# Patient Record
Sex: Female | Born: 1980 | Race: White | Hispanic: No | Marital: Single | Smoking: Former smoker
Health system: Southern US, Community
[De-identification: ages and names within clinical notes are randomized; demographics above are authoritative.]

## PROBLEM LIST (undated history)

## (undated) DIAGNOSIS — I10 Essential (primary) hypertension: Secondary | ICD-10-CM

## (undated) DIAGNOSIS — Z803 Family history of malignant neoplasm of breast: Secondary | ICD-10-CM

## (undated) DIAGNOSIS — F32A Depression, unspecified: Secondary | ICD-10-CM

## (undated) DIAGNOSIS — F909 Attention-deficit hyperactivity disorder, unspecified type: Secondary | ICD-10-CM

## (undated) DIAGNOSIS — F319 Bipolar disorder, unspecified: Secondary | ICD-10-CM

## (undated) DIAGNOSIS — Z808 Family history of malignant neoplasm of other organs or systems: Secondary | ICD-10-CM

## (undated) DIAGNOSIS — G43909 Migraine, unspecified, not intractable, without status migrainosus: Secondary | ICD-10-CM

## (undated) DIAGNOSIS — N19 Unspecified kidney failure: Secondary | ICD-10-CM

## (undated) DIAGNOSIS — F419 Anxiety disorder, unspecified: Secondary | ICD-10-CM

## (undated) DIAGNOSIS — G629 Polyneuropathy, unspecified: Secondary | ICD-10-CM

## (undated) DIAGNOSIS — F4310 Post-traumatic stress disorder, unspecified: Secondary | ICD-10-CM

## (undated) DIAGNOSIS — G8929 Other chronic pain: Secondary | ICD-10-CM

## (undated) HISTORY — DX: Unspecified kidney failure: N19

## (undated) HISTORY — DX: Family history of malignant neoplasm of other organs or systems: Z80.8

## (undated) HISTORY — DX: Family history of malignant neoplasm of breast: Z80.3

## (undated) HISTORY — PX: DILATION AND CURETTAGE OF UTERUS: SHX78

## (undated) HISTORY — DX: Polyneuropathy, unspecified: G62.9

## (undated) HISTORY — DX: Post-traumatic stress disorder, unspecified: F43.10

## (undated) HISTORY — DX: Migraine, unspecified, not intractable, without status migrainosus: G43.909

---

## 2019-09-17 IMAGING — CR DG THORACIC SPINE 2V
2 series · 2 of 2 positions shown · non-contrast
Comparison: None.

CLINICAL DATA: Upper back pain

EXAM:
THORACIC SPINE 2 VIEWS

[t t-spine a.p.]
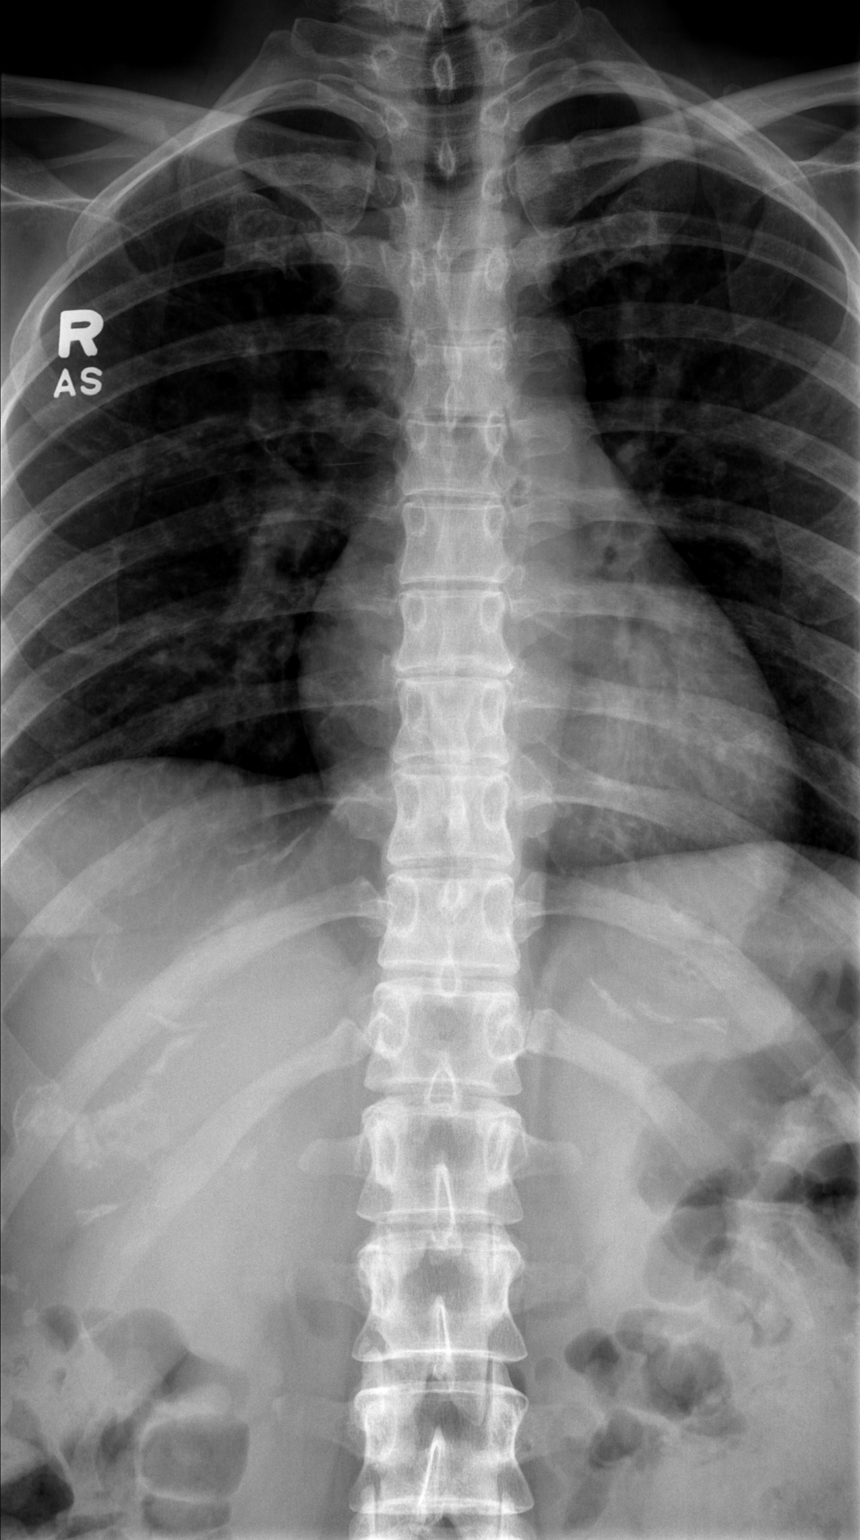

[t t-spine lat *]
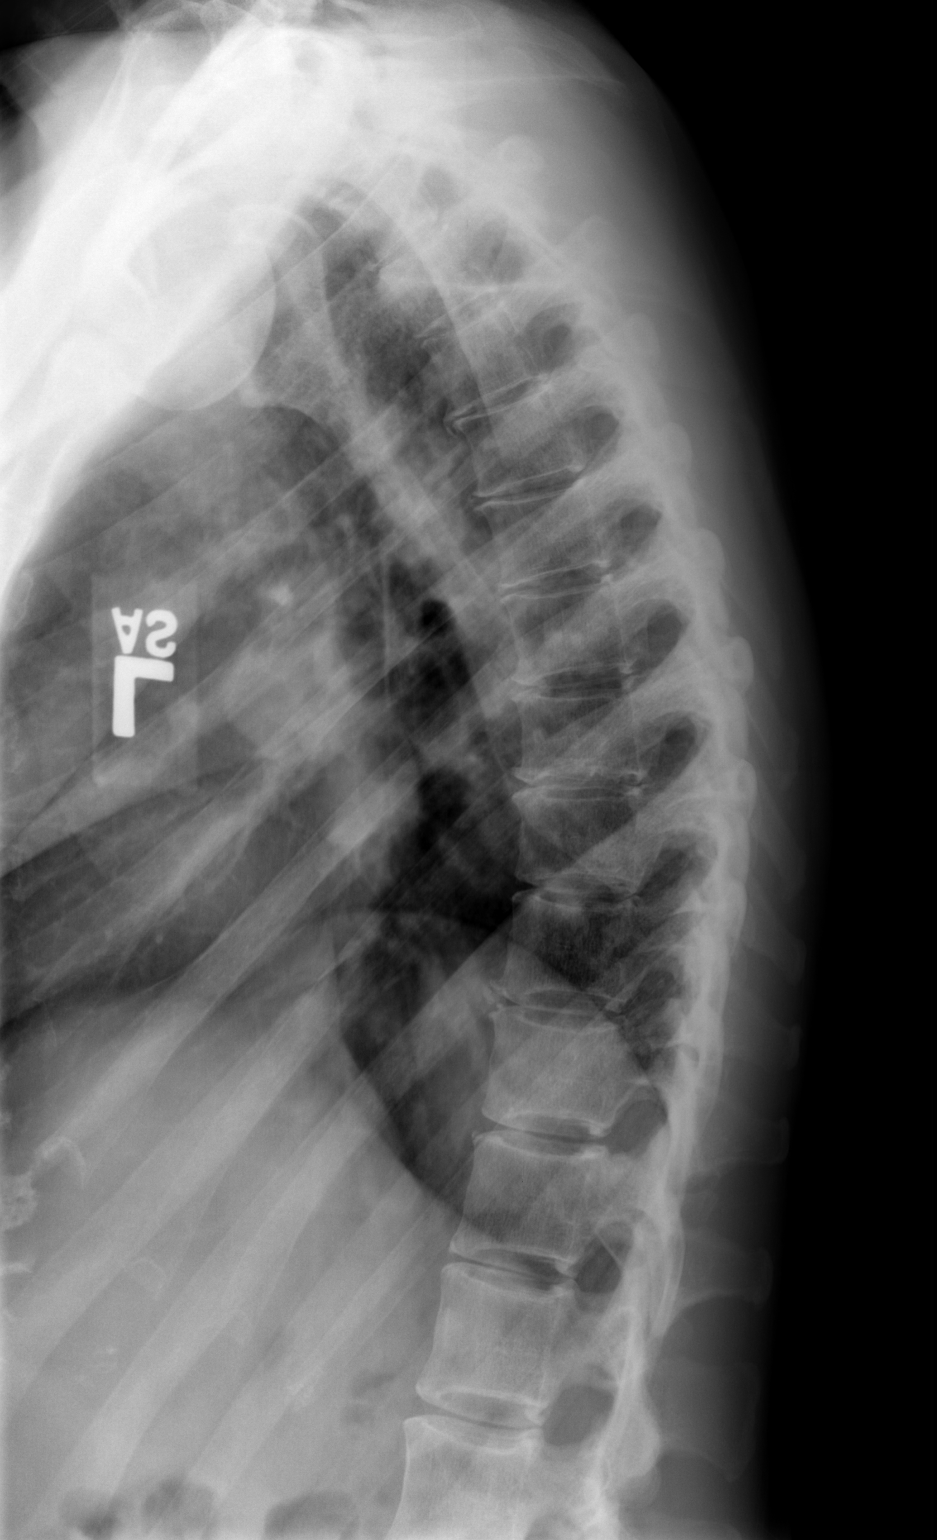

[2 of 2 positions shown; findings below may reference images not displayed]

FINDINGS: There is no evidence of thoracic spine fracture. Alignment is
normal. No other significant bone abnormalities are identified.
IMPRESSION: Negative.

## 2020-02-22 ENCOUNTER — Ambulatory Visit (HOSPITAL_COMMUNITY)
Admission: EM | Admit: 2020-02-22 | Discharge: 2020-02-22 | Disposition: A | Discharge status: Home / Self Care | Payer: Medicaid Other

## 2020-02-22 NOTE — Care Management (Signed)
Patient wants to receive services with [facility] an outpatient basis.  Patient was linked to [facility] outpatient walk in clinic.    Patient denies SI/HI/Psychosis/Substance Abuse.  Patient denies MSE Exam.

## 2020-04-12 ENCOUNTER — Other Ambulatory Visit: Payer: Self-pay | Admitting: *Deleted

## 2020-04-12 DIAGNOSIS — Z1231 Encounter for screening mammogram for malignant neoplasm of breast: Secondary | ICD-10-CM

## 2020-04-22 ENCOUNTER — Other Ambulatory Visit: Payer: Self-pay | Admitting: Family

## 2020-04-22 DIAGNOSIS — N6320 Unspecified lump in the left breast, unspecified quadrant: Secondary | ICD-10-CM

## 2020-05-26 ENCOUNTER — Other Ambulatory Visit: Payer: Self-pay

## 2020-05-26 ENCOUNTER — Ambulatory Visit
Admission: RE | Admit: 2020-05-26 | Discharge: 2020-05-26 | Disposition: A | Discharge status: Home / Self Care | Payer: Medicaid Other | Source: Ambulatory Visit | Attending: Family | Admitting: Family

## 2020-05-26 DIAGNOSIS — N6320 Unspecified lump in the left breast, unspecified quadrant: Secondary | ICD-10-CM

## 2020-05-26 IMAGING — US US BREAST*L* LIMITED INC AXILLA
1 series · 12 of 12 positions shown · non-contrast
Comparison: Previous exam(s).

CLINICAL DATA: 39-year-old female presenting for two year follow-up
of probably benign left breast masses.

EXAM:
ULTRASOUND OF THE LEFT BREAST

[Series 1: us breast*left* limited inc axilla · 0.06mm/px · 12 of 12 slices shown]
[im 1/12]
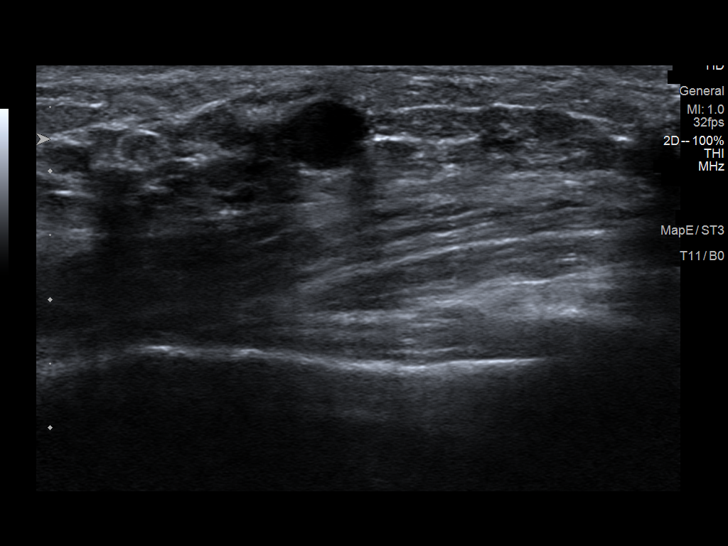
[im 2/12]
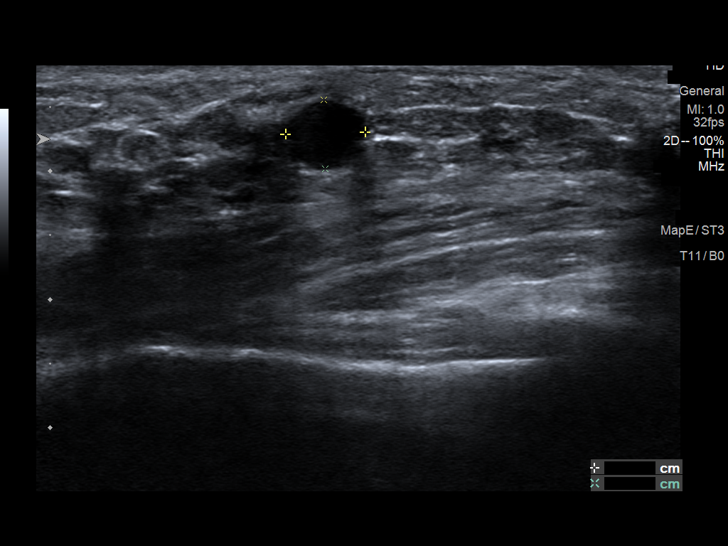
[im 3/12]
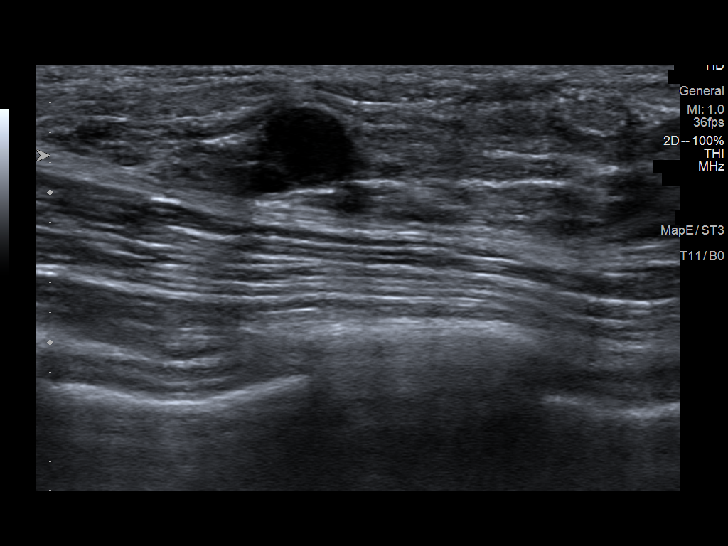
[im 4/12]
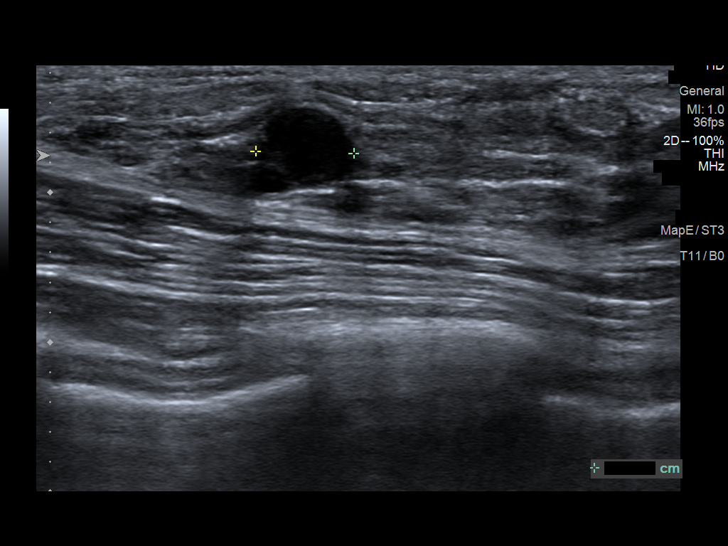
[im 5/12]
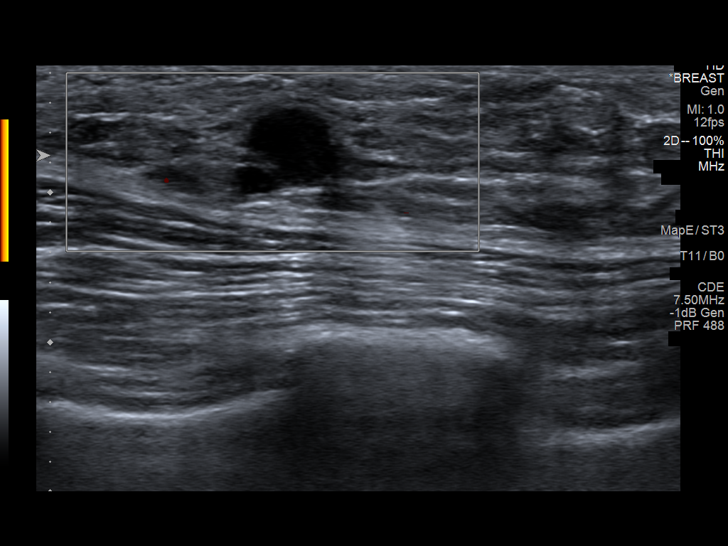
[im 6/12]
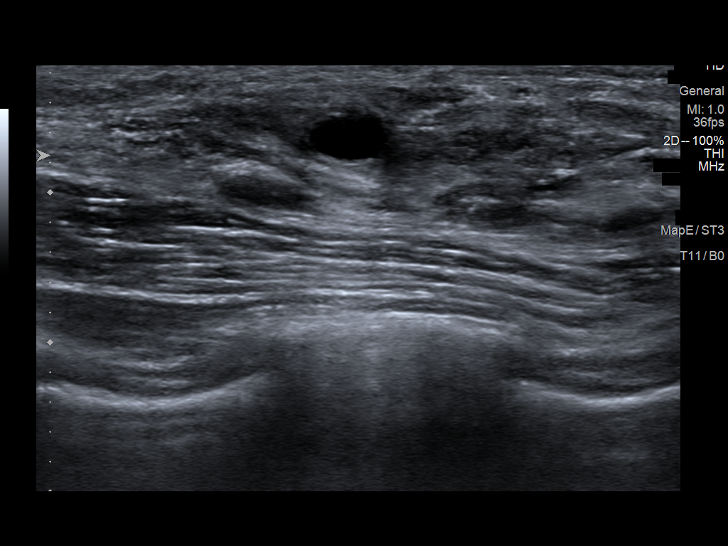
[im 7/12]
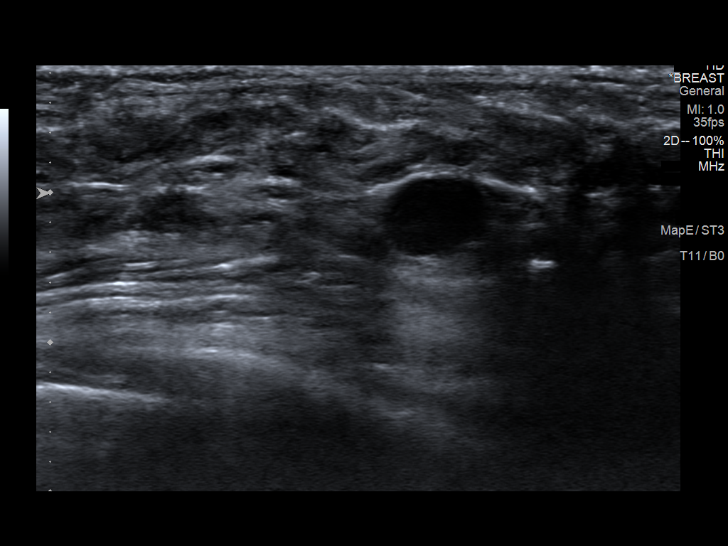
[im 8/12]
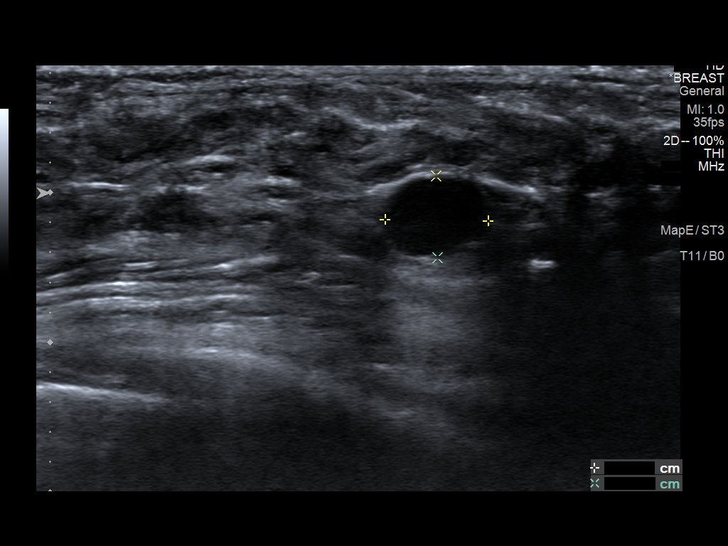
[im 9/12]
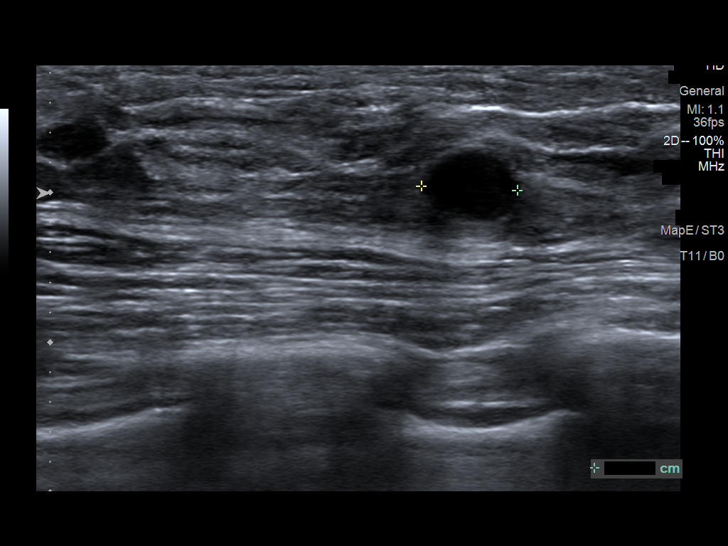
[im 10/12]
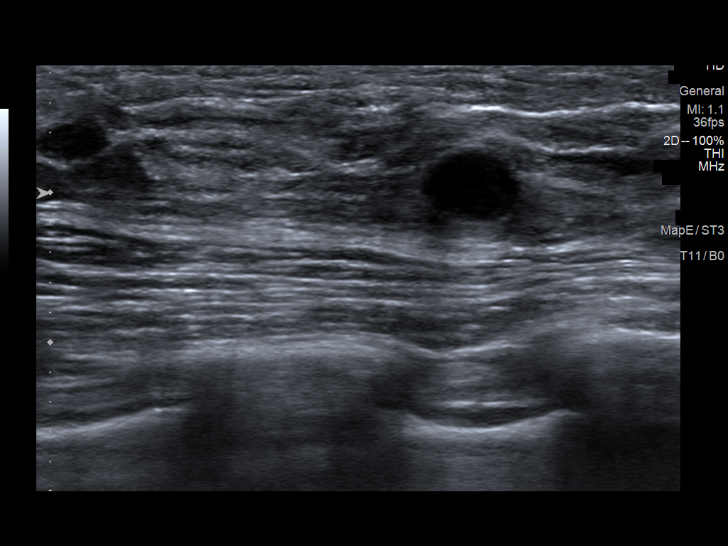
[im 11/12]
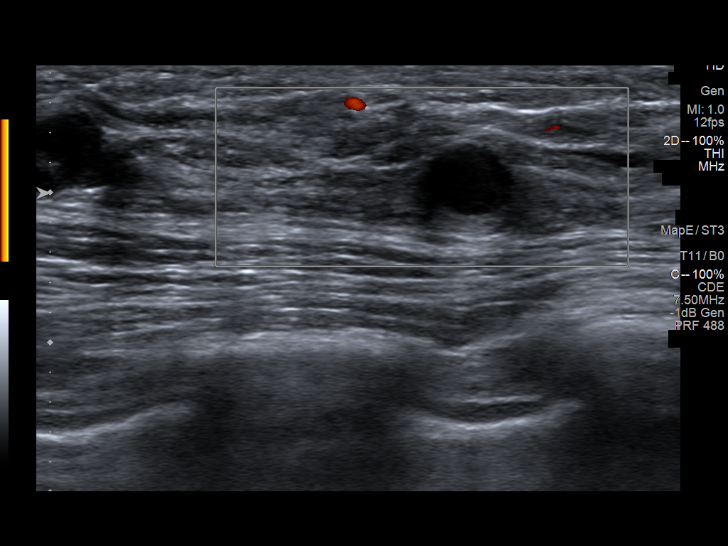
[im 12/12]
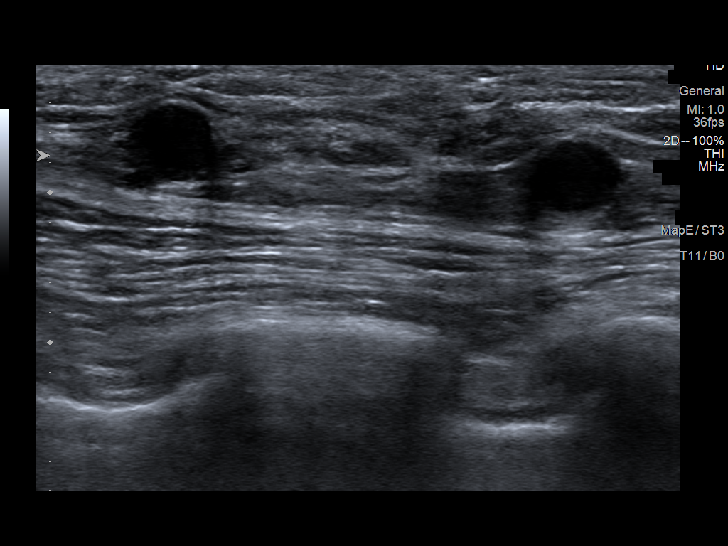

[12 of 12 positions shown; findings below may reference images not displayed]

FINDINGS: Targeted ultrasound is performed in the left breast at 11 o'clock 6
cm from the nipple demonstrating an oval circumscribed hypoechoic
mass measuring 0.6 x 0.5 x 0.7 cm, previously measuring 0.6 x 0.5 x
0.7 cm.

Targeted ultrasound is performed in the left breast at 11 o'clock 4
cm from nipple demonstrating an oval circumscribed hypoechoic mass
measuring 0.7 x 0.6 x 0.6 cm, previously measuring 0.6 x 0.7 cm.
IMPRESSION: Stable masses in the left breast at 11 o'clock, which given
stability for over 2 years are considered benign.

RECOMMENDATION:
Return to routine annual screening mammography which will be due in
[DATE].

I have discussed the findings and recommendations with the patient.
If applicable, a reminder letter will be sent to the patient
regarding the next appointment.

BI-RADS CATEGORY  2: Benign.

## 2020-07-15 ENCOUNTER — Other Ambulatory Visit: Payer: Self-pay | Admitting: Orthopedic Surgery

## 2020-07-15 DIAGNOSIS — M25512 Pain in left shoulder: Secondary | ICD-10-CM

## 2020-08-06 ENCOUNTER — Other Ambulatory Visit: Payer: Self-pay

## 2020-08-06 ENCOUNTER — Ambulatory Visit
Admission: RE | Admit: 2020-08-06 | Discharge: 2020-08-06 | Disposition: A | Discharge status: Home / Self Care | Payer: Medicaid Other | Source: Ambulatory Visit | Attending: Orthopedic Surgery | Admitting: Orthopedic Surgery

## 2020-08-06 DIAGNOSIS — M25512 Pain in left shoulder: Secondary | ICD-10-CM

## 2020-08-06 IMAGING — MR MR SHOULDER*L* W/O CM
4 of 5 series · 27 of 40 positions shown · non-contrast
Comparison: None.

CLINICAL DATA: Left shoulder pain for 3 years. Worsening pain with
lifting. No acute injury or prior relevant surgery.

EXAM:
MRI OF THE LEFT SHOULDER WITHOUT CONTRAST
TECHNIQUE: Multiplanar, multisequence MR imaging of the shoulder was performed.
No intravenous contrast was administered.

[Series 6: T2 fat-sat · axial · left · 3.0mm · 0.62mm/px · z∈[-58,+40]mm · 8 of 27 slices shown (1 of 3)]
[im 1/27]
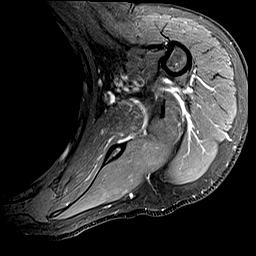
[im 4/27]
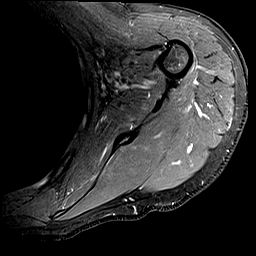
[im 8/27]
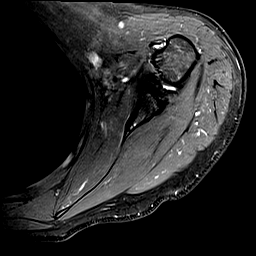
[im 12/27]
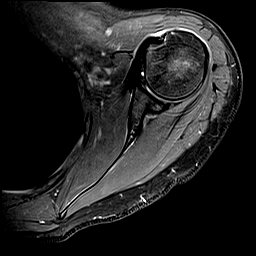
[im 15/27]
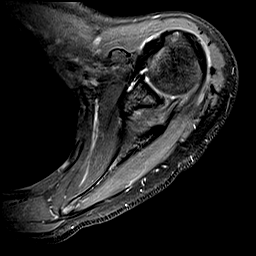
[im 19/27]
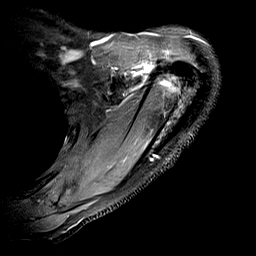
[im 23/27]
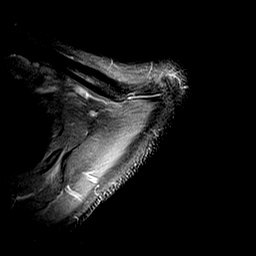
[im 27/27]
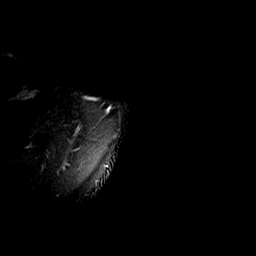

[Series 7: T2 fat-sat · oblique · left · 4.0mm · 0.27mm/px · 8 of 23 slices shown (2 of 3)]
[im 1/23]
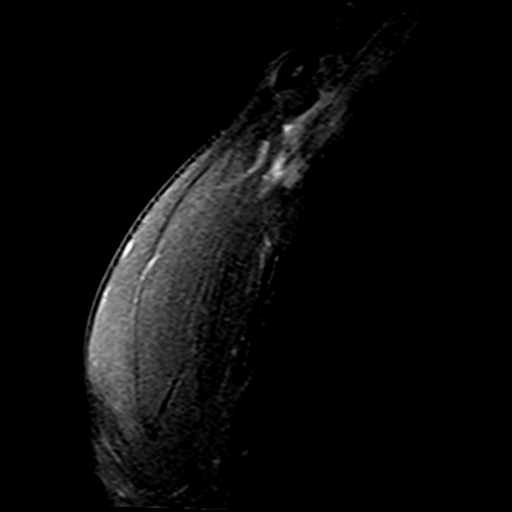
[im 4/23]
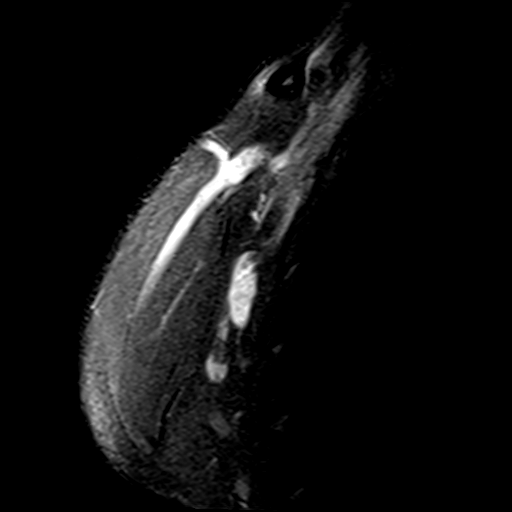
[im 7/23]
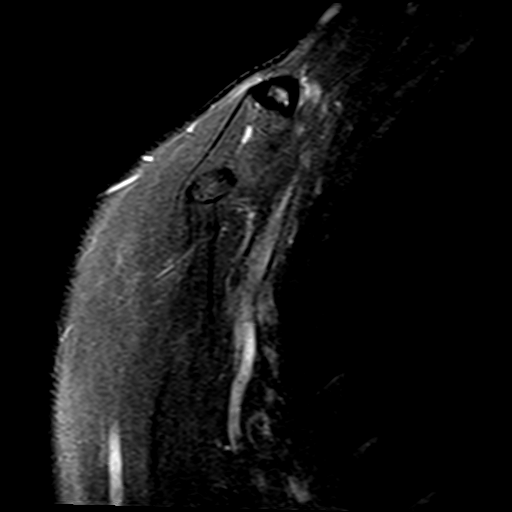
[im 10/23]
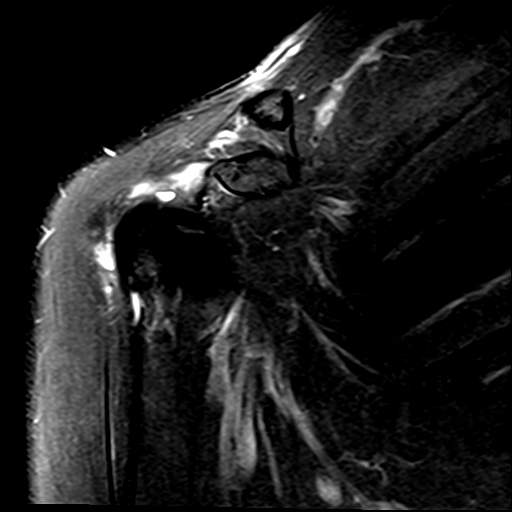
[im 13/23]
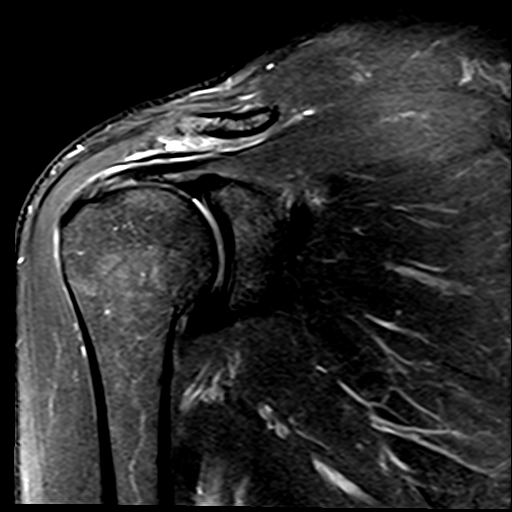
[im 16/23]
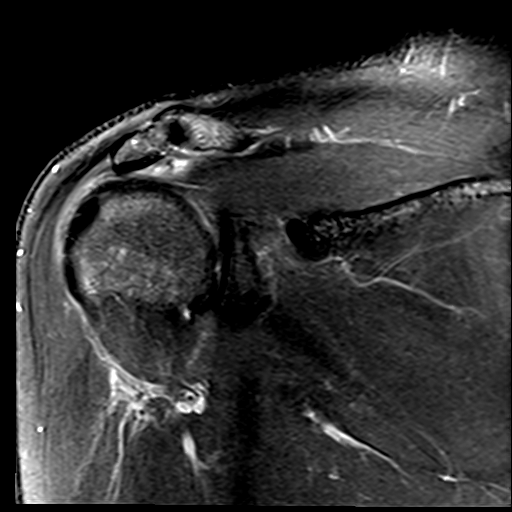
[im 19/23]
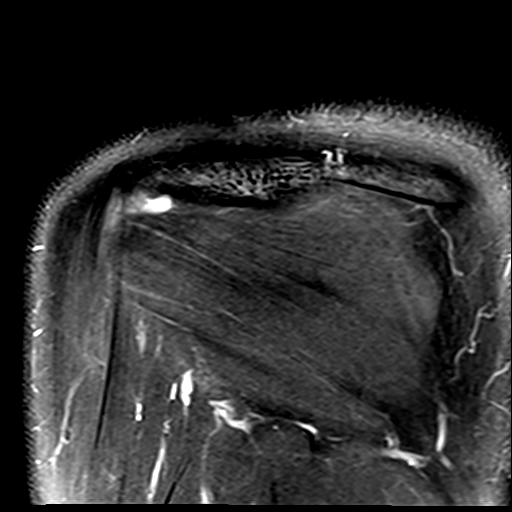
[im 23/23]
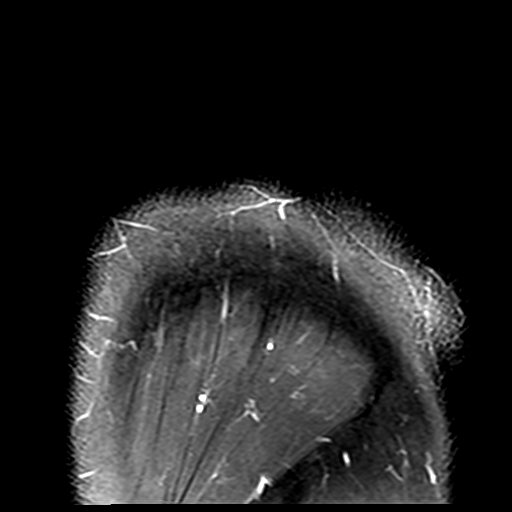

[Series 8: PD · oblique · left · 4.0mm · 0.27mm/px · 8 of 23 slices shown]
[im 1/23]
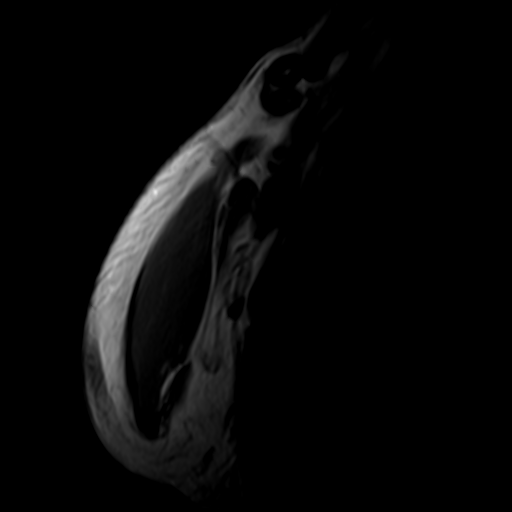
[im 4/23]
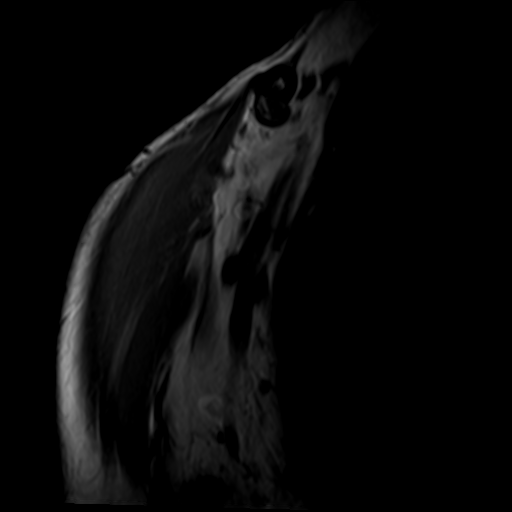
[im 7/23]
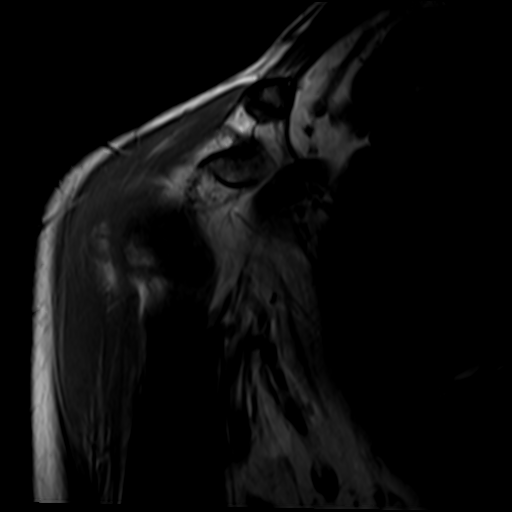
[im 10/23]
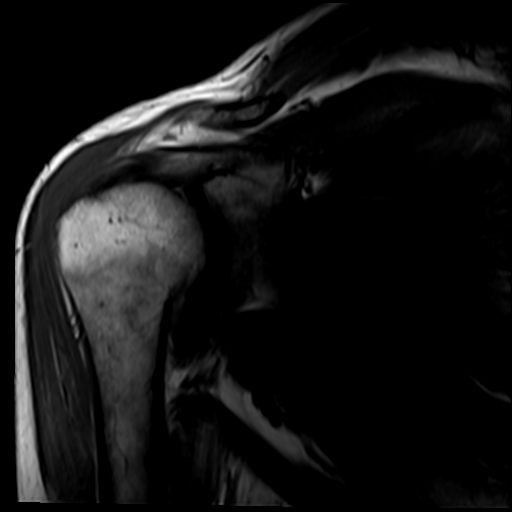
[im 13/23]
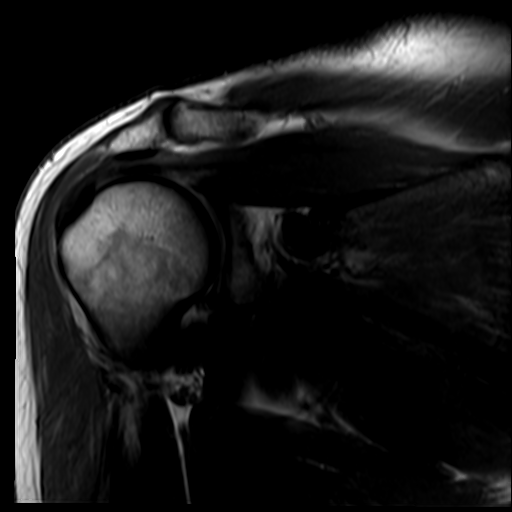
[im 16/23]
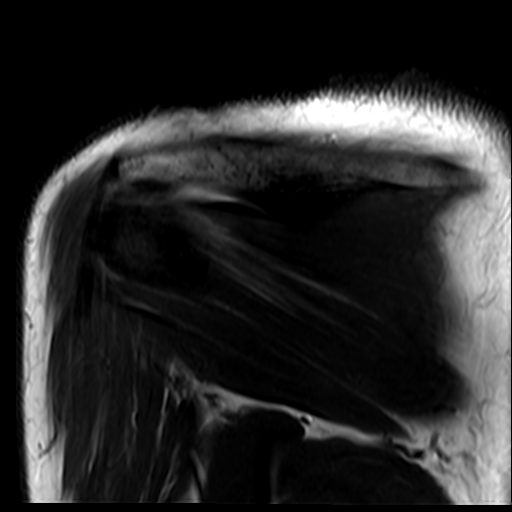
[im 19/23]
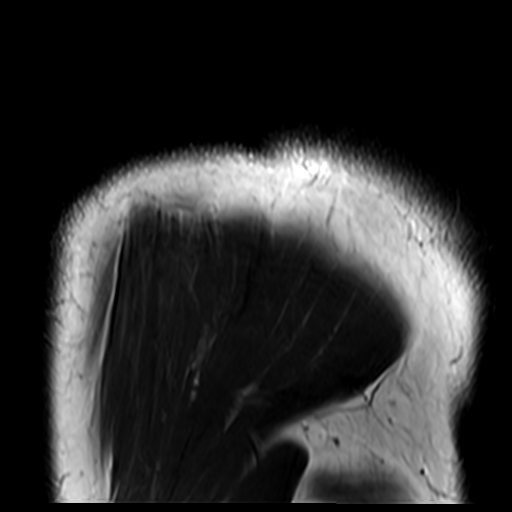
[im 23/23]
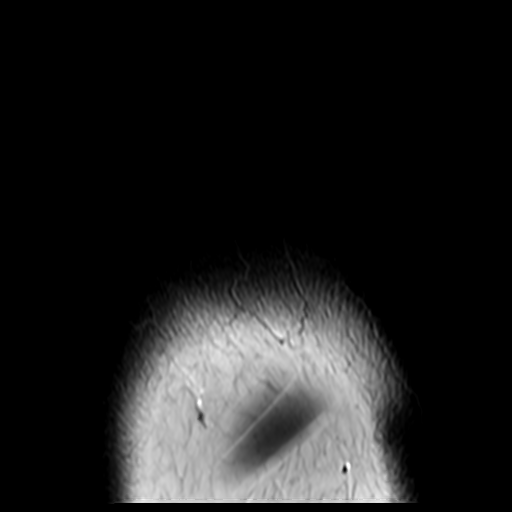

[Series 9: T2 fat-sat · coronal · left · 4.0mm · 0.59mm/px · 3 of 23 slices shown (3 of 3)]
[im 4/23]
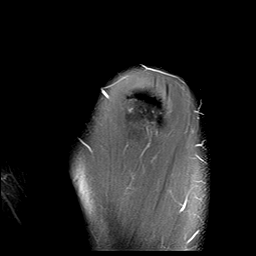
[im 13/23]
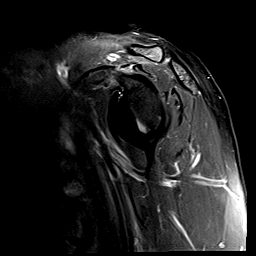
[im 19/23]
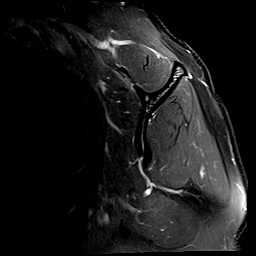

[27 of 40 positions shown; findings below may reference images not displayed]

FINDINGS: Rotator cuff: Mild supraspinatus tendinosis and bursal surface
fraying without tear. The infraspinatus, subscapularis and teres
minor tendons appear normal.

Muscles:  No focal muscular atrophy or edema.

Biceps long head:  Intact and normally positioned.

Acromioclavicular Joint: The acromion is type 1. There are mild
acromioclavicular degenerative changes. There is a moderate amount
of complex fluid anteriorly in the subacromial-subdeltoid bursa,
extending into the rotator interval.

Glenohumeral Joint: No significant shoulder joint effusion or
glenohumeral arthropathy.

Labrum:  No evidence of labral tear or paralabral cyst.

Bones: No acute or significant extra-articular osseous findings.

Other: No significant soft tissue findings.
IMPRESSION: 1. Moderate subacromial-subdeltoid bursitis.
2. Mild supraspinatus tendinosis and bursal surface fraying. No
evidence of rotator cuff tear.
3. The labrum and biceps tendon appear intact.
4. Mild acromioclavicular degenerative changes.

## 2020-08-30 ENCOUNTER — Encounter (HOSPITAL_COMMUNITY): Payer: Self-pay | Admitting: Emergency Medicine

## 2020-08-30 ENCOUNTER — Other Ambulatory Visit: Payer: Self-pay

## 2020-08-30 ENCOUNTER — Ambulatory Visit (HOSPITAL_COMMUNITY)
Admission: EM | Admit: 2020-08-30 | Discharge: 2020-08-30 | Disposition: A | Discharge status: Home / Self Care | Payer: Medicaid Other

## 2020-08-30 DIAGNOSIS — I10 Essential (primary) hypertension: Secondary | ICD-10-CM

## 2020-08-30 DIAGNOSIS — R079 Chest pain, unspecified: Secondary | ICD-10-CM | POA: Diagnosis not present

## 2020-08-30 DIAGNOSIS — R42 Dizziness and giddiness: Secondary | ICD-10-CM | POA: Diagnosis not present

## 2020-08-30 HISTORY — DX: Essential (primary) hypertension: I10

## 2020-08-30 HISTORY — DX: Other chronic pain: G89.29

## 2020-08-30 HISTORY — DX: Depression, unspecified: F32.A

## 2020-08-30 HISTORY — DX: Anxiety disorder, unspecified: F41.9

## 2020-08-30 HISTORY — DX: Attention-deficit hyperactivity disorder, unspecified type: F90.9

## 2020-08-30 NOTE — ED Provider Notes (Signed)
[facility]    CSN: [id number] Arrival date & time: 08/30/20  1803      History   Chief Complaint Chief Complaint  Patient presents with  . Hypertension  . Hand Swelling    HPI Tina Russell is a 40 y.o. female.   Patient presents with high blood pressure and swelling in her hands earlier today.  She also reports dizziness and chest pain earlier today; the symptoms have resolved.  She was recently started on Strattera; last dose taken yesterday.  She also recently started taking Lamictal.  She denies numbness, weakness, shortness of breath, or other symptoms.  Her medical history includes hypertension, depression, anxiety, ADHD, chronic back pain.  The history is provided by the patient.    Past Medical History:  Diagnosis Date  . ADHD   . Anxiety   . Chronic back pain   . Depression   . Hypertension     There are no problems to display for this patient.   History reviewed. No pertinent surgical history.  OB History   No obstetric history on file.      Home Medications    Prior to Admission medications   Medication Sig Start Date End Date Taking? Authorizing Provider  lamoTRIgine (LAMICTAL) 25 MG tablet Take 1 tablet daily for 2 weeks then 2 tablets daily thereafter. 08/09/20  Yes [provider]  QUEtiapine (SEROQUEL) 25 MG tablet Take 1-2 tablets by mouth nightly 07/25/20  Yes [provider]  atomoxetine (STRATTERA) 25 MG capsule Take 25 mg by mouth every morning. 08/23/20   [provider]  levonorgestrel (MIRENA) 20 MCG/24HR IUD by Intrauterine route.    [provider]    Family History Family History  Problem Relation Age of Onset  . Hypertension Mother   . Hyperlipidemia Mother   . Hypertension Father   . Hyperlipidemia Father   . Alcohol abuse Father     Social History Social History   Tobacco Use  . Smoking status: Former Smoker    Types: E-cigarettes  . Smokeless tobacco: Never Used  Vaping Use   . Vaping Use: Every day  Substance Use Topics  . Alcohol use: Yes  . Drug use: Never     Allergies   Patient has no known allergies.   Review of Systems Review of Systems  Constitutional: Negative for chills and fever.  HENT: Negative for ear pain and sore throat.   Eyes: Negative for pain and visual disturbance.  Respiratory: Negative for cough and shortness of breath.   Cardiovascular: Positive for chest pain. Negative for palpitations.  Gastrointestinal: Negative for abdominal pain and vomiting.  Genitourinary: Negative for dysuria and hematuria.  Musculoskeletal: Negative for arthralgias and back pain.       Bilateral hand swelling  Skin: Negative for color change and rash.  Neurological: Positive for dizziness. Negative for syncope, weakness and numbness.  All other systems reviewed and are negative.    Physical Exam Triage Vital Signs ED Triage Vitals  Enc Vitals Group     BP      Pulse      Resp      Temp      Temp src      SpO2      Weight      Height      Head Circumference      Peak Flow      Pain Score      Pain Loc      Pain  Edu?      Excl. in GC?    No data found.  Updated Vital Signs BP (!) 167/103 (BP Location: Left Arm)   Pulse 71   Temp 98.1 F (36.7 C) (Oral)   Resp 18   SpO2 98%   Visual Acuity Right Eye Distance:   Left Eye Distance:   Bilateral Distance:    Right Eye Near:   Left Eye Near:    Bilateral Near:     Physical Exam Vitals and nursing note reviewed.  Constitutional:      General: She is not in acute distress.    Appearance: She is well-developed. She is not ill-appearing.  HENT:     Head: Normocephalic and atraumatic.     Mouth/Throat:     Mouth: Mucous membranes are moist.  Eyes:     Conjunctiva/sclera: Conjunctivae normal.  Cardiovascular:     Rate and Rhythm: Normal rate and regular rhythm.     Heart sounds: Normal heart sounds.  Pulmonary:     Effort: Pulmonary effort is normal. No respiratory  distress.     Breath sounds: Normal breath sounds.  Abdominal:     Palpations: Abdomen is soft.     Tenderness: There is no abdominal tenderness.  Musculoskeletal:        General: Normal range of motion.     Cervical back: Neck supple.     Right lower leg: No edema.     Left lower leg: No edema.     Comments: No hand swelling noted.  Skin:    General: Skin is warm and dry.  Neurological:     General: No focal deficit present.     Mental Status: She is alert and oriented to person, place, and time.     Sensory: No sensory deficit.     Motor: No weakness.     Gait: Gait normal.  Psychiatric:        Mood and Affect: Mood normal.        Behavior: Behavior normal.      UC Treatments / Results  Labs (all labs ordered are listed, but only abnormal results are displayed) Labs Reviewed - No data to display  EKG   Radiology No results found.  Procedures Procedures (including critical care time)  Medications Ordered in UC Medications - No data to display  Initial Impression / Assessment and Plan / UC Course  I have reviewed the triage vital signs and the nursing notes.  Pertinent labs & imaging results that were available during my care of the patient were reviewed by me and considered in my medical decision making (see chart for details).   Elevated blood pressure reading with hypertension.  Chest pain, dizziness.  EKG shows sinus rhythm, rate 78, no previous to compare, no ST elevation.  Patient does not currently have chest pain or dizziness.  Discussed limitations of evaluation in the urgent care for chest pain.  Patient requests transfer to the ED for additional evaluation.  Instructed her to stop taking the Strattera and to follow-up with her PCP tomorrow.  She agrees to plan of care.   Final Clinical Impressions(s) / UC Diagnoses   Final diagnoses:  Elevated blood pressure reading in office with diagnosis of hypertension  Chest pain, unspecified type  Dizziness      Discharge Instructions     Go to the emergency department if you have acute chest pain, dizziness, or other concerning symptoms.  Stop taking the Strattera.  Follow-up with your  primary care provider tomorrow.    ED Prescriptions    None     PDMP not reviewed this encounter.   Mickie Bail, NP 08/30/20 1935

## 2020-08-30 NOTE — Discharge Instructions (Addendum)
Go to the emergency department if you have acute chest pain, dizziness, or other concerning symptoms.  Stop taking the Strattera.  Follow-up with your primary care provider tomorrow.

## 2020-08-30 NOTE — ED Triage Notes (Signed)
Pt presents with high blood pressure and swelling in hands. States was recently prescribed Strattera. States is also having nigh sweats after starting back on lamictal.

## 2020-09-19 ENCOUNTER — Other Ambulatory Visit: Payer: Self-pay | Admitting: General Practice

## 2020-09-19 ENCOUNTER — Other Ambulatory Visit: Payer: Self-pay | Admitting: Family

## 2020-09-19 DIAGNOSIS — Z1231 Encounter for screening mammogram for malignant neoplasm of breast: Secondary | ICD-10-CM

## 2020-09-22 ENCOUNTER — Other Ambulatory Visit: Payer: Self-pay

## 2020-09-22 ENCOUNTER — Ambulatory Visit
Admission: RE | Admit: 2020-09-22 | Discharge: 2020-09-22 | Disposition: A | Discharge status: Home / Self Care | Payer: Medicaid Other | Source: Ambulatory Visit | Attending: Family | Admitting: Family

## 2020-09-22 DIAGNOSIS — Z1231 Encounter for screening mammogram for malignant neoplasm of breast: Secondary | ICD-10-CM

## 2020-09-26 ENCOUNTER — Other Ambulatory Visit: Payer: Self-pay | Admitting: Family

## 2020-09-26 DIAGNOSIS — N644 Mastodynia: Secondary | ICD-10-CM

## 2020-10-09 HISTORY — PX: BREAST BIOPSY: SHX20

## 2020-10-13 ENCOUNTER — Ambulatory Visit
Admission: RE | Admit: 2020-10-13 | Discharge: 2020-10-13 | Disposition: A | Discharge status: Home / Self Care | Payer: Medicaid Other | Source: Ambulatory Visit | Attending: Family Medicine | Admitting: Family Medicine

## 2020-10-13 ENCOUNTER — Other Ambulatory Visit: Payer: Self-pay | Admitting: Family Medicine

## 2020-10-13 ENCOUNTER — Other Ambulatory Visit: Payer: Self-pay

## 2020-10-13 DIAGNOSIS — M549 Dorsalgia, unspecified: Secondary | ICD-10-CM

## 2020-10-13 DIAGNOSIS — R0789 Other chest pain: Secondary | ICD-10-CM

## 2020-10-13 DIAGNOSIS — M542 Cervicalgia: Secondary | ICD-10-CM

## 2020-10-13 DIAGNOSIS — M545 Low back pain, unspecified: Secondary | ICD-10-CM

## 2020-10-13 IMAGING — CR DG CERVICAL SPINE COMPLETE 4+V
6 series · 6 of 6 positions shown · non-contrast
Comparison: None.

CLINICAL DATA: Cervicalgia

EXAM:
CERVICAL SPINE - COMPLETE 4+ VIEW

[w c-spine lat]
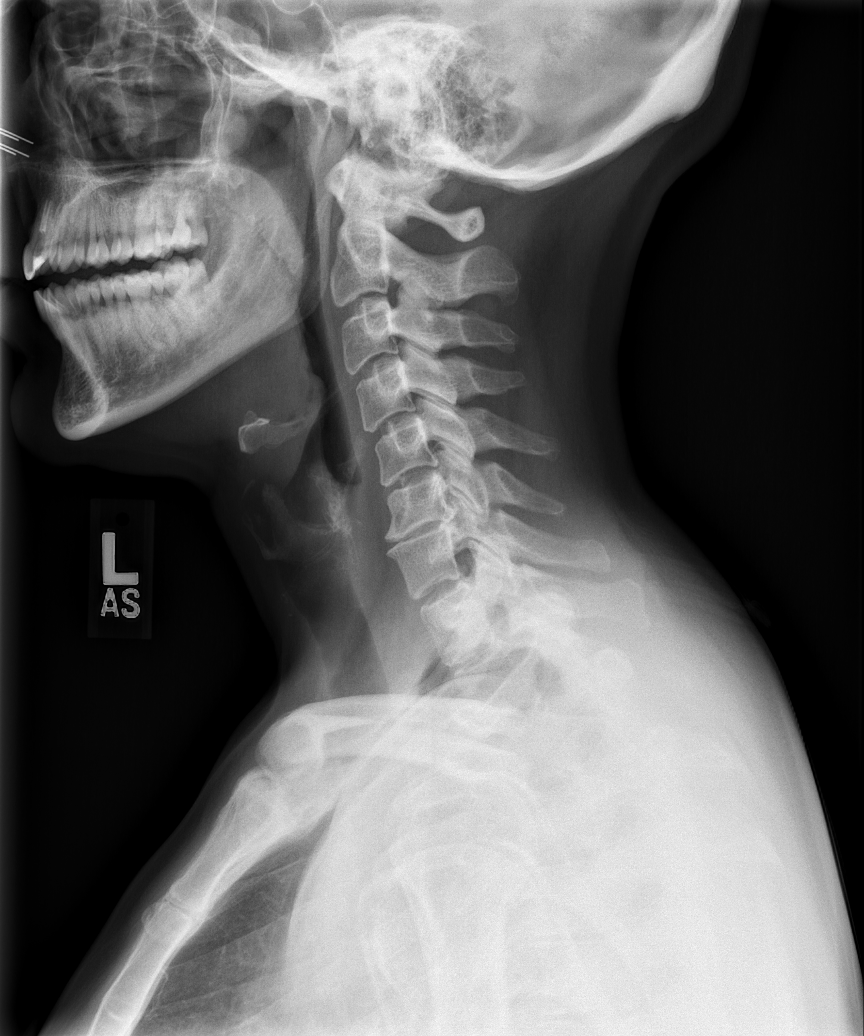

[w c-spine oblique (1 of 2)]
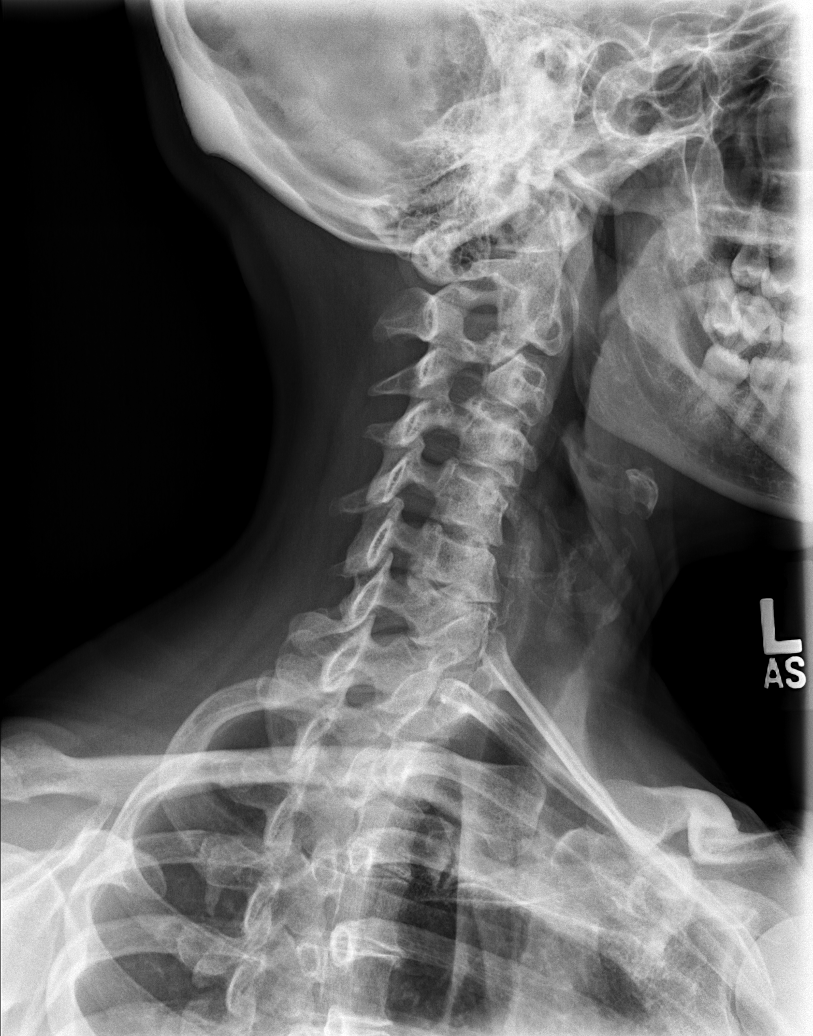

[w c-spine oblique (2 of 2)]
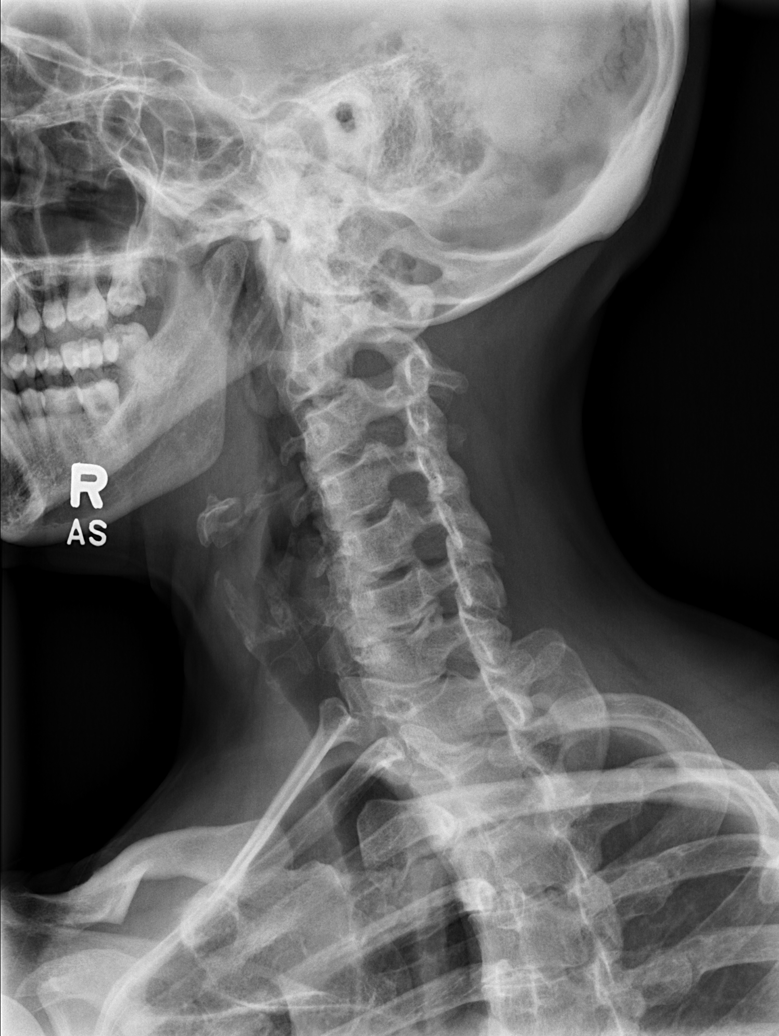

[w c-spine a.p. *]
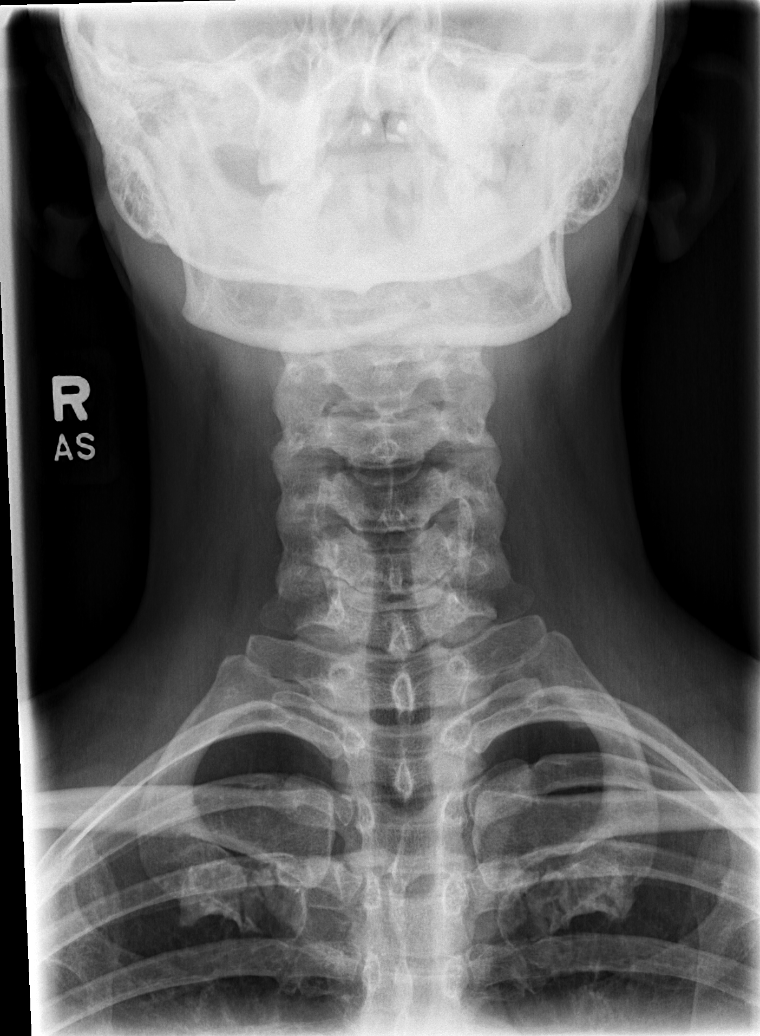

[w c-spine odontoid * (1 of 2)]
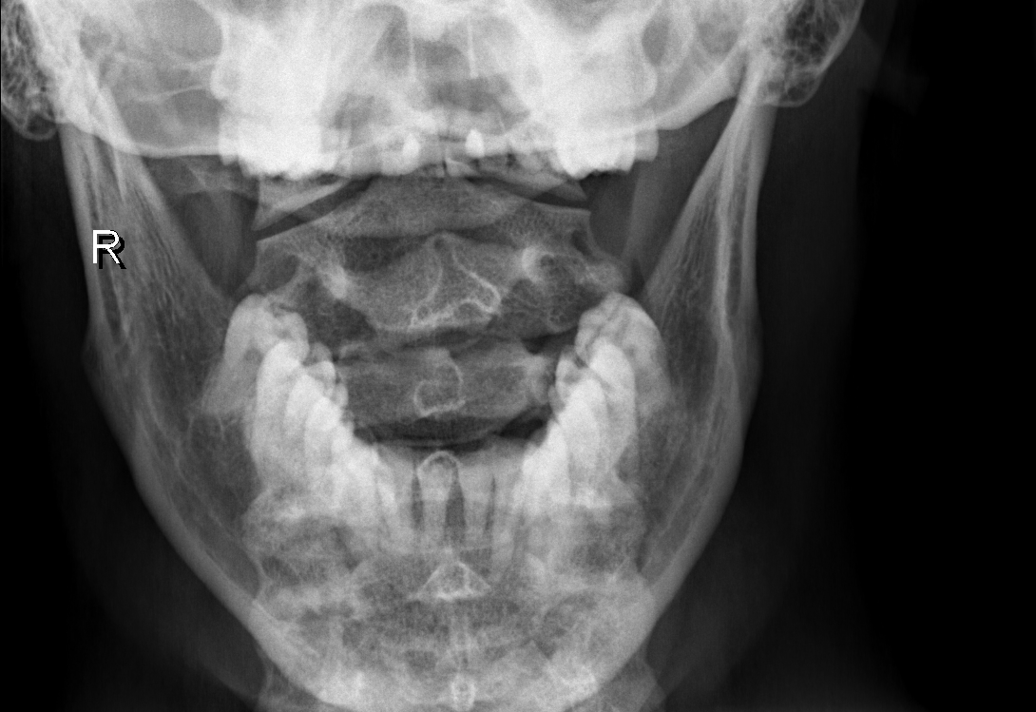

[w c-spine odontoid * (2 of 2)]
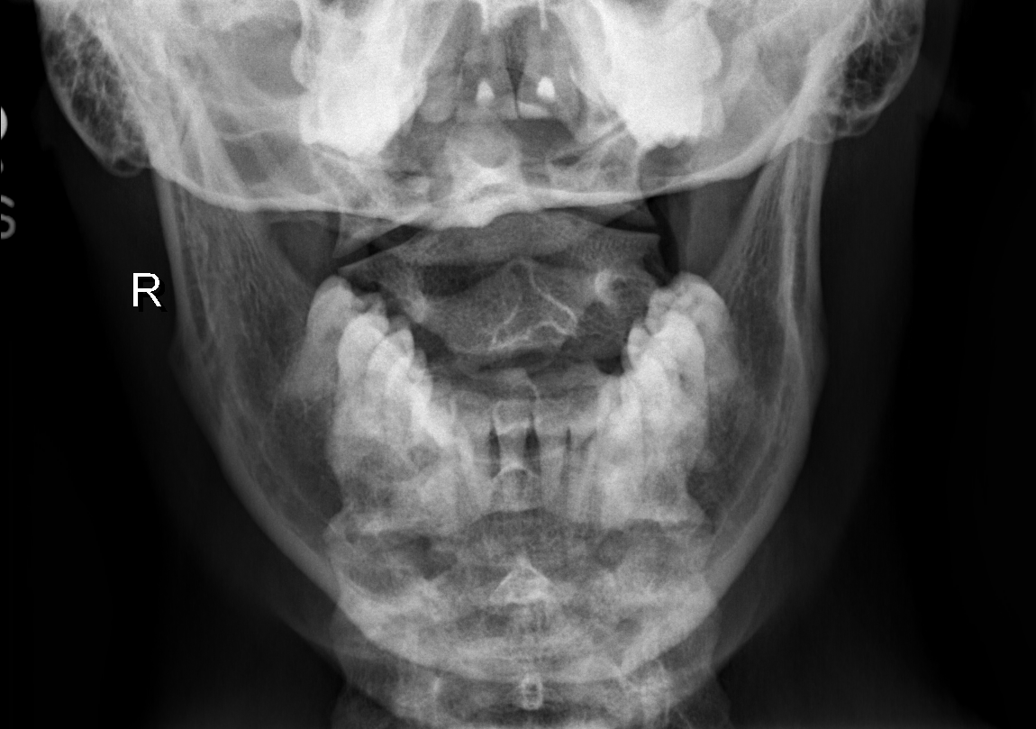

[6 of 6 positions shown; findings below may reference images not displayed]

FINDINGS: Cervical spine is visualized to the level of T1.

Vertebral body heights are maintained. Prevertebral soft tissues are
normal. No acute fracture.

2 mm retrolisthesis of C6 on C7.

Degenerative disease with disc height loss at C6-7 with broad-based
disc osteophyte complex. Bilateral facet arthropathy at C6-7 and
C7-T1. Bilateral uncovertebral degenerative changes at C6-7 with
foraminal encroachment.
IMPRESSION: Cervical spine spondylosis at C6-7 as described above.

## 2020-10-13 IMAGING — CR DG LUMBAR SPINE COMPLETE 4+V
5 series · 5 of 5 positions shown · non-contrast
Comparison: None.

CLINICAL DATA: Low back pain

EXAM:
LUMBAR SPINE - COMPLETE 4+ VIEW

[t l-spine a.p.]
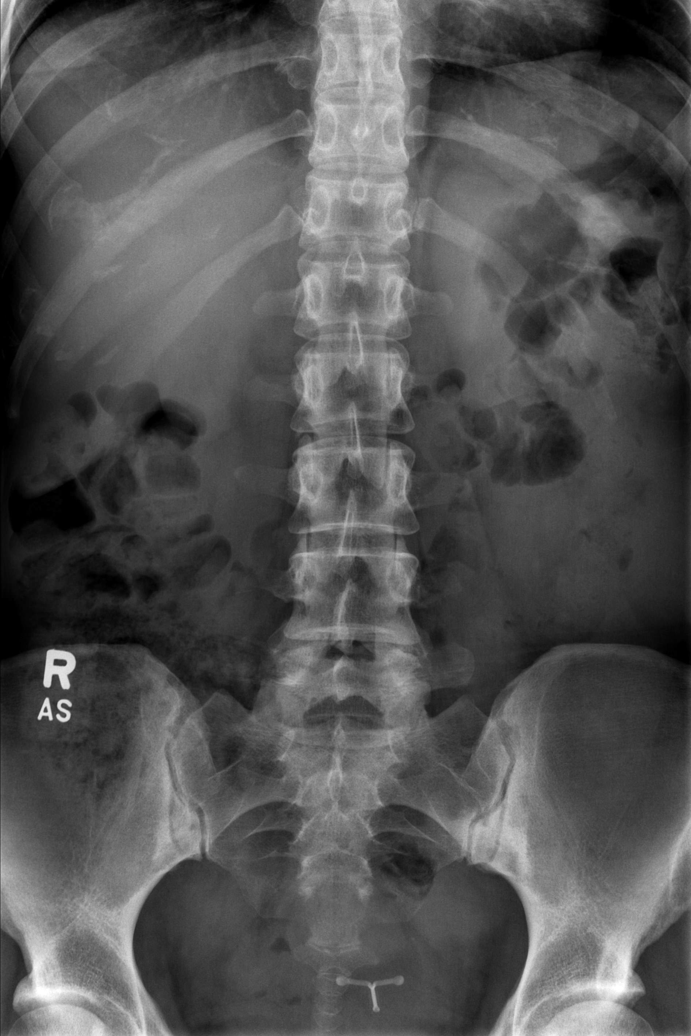

[t l-spine oblique exposure (1 of 2)]
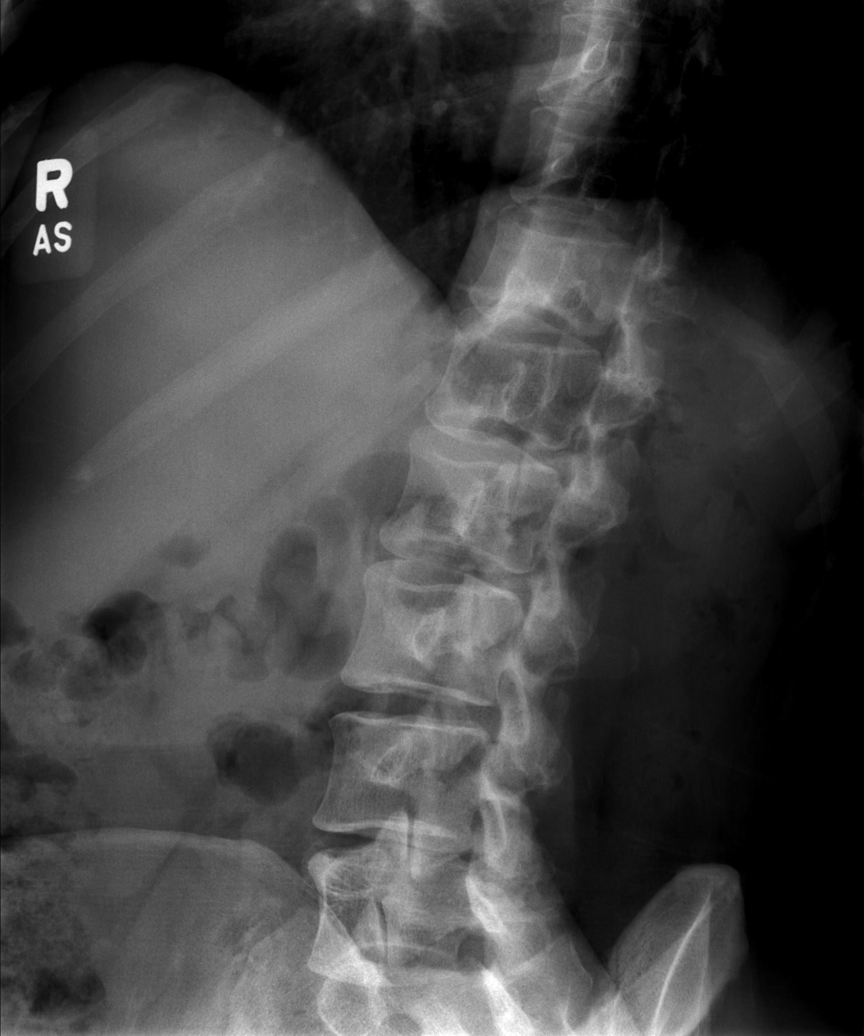

[t l-spine oblique exposure (2 of 2)]
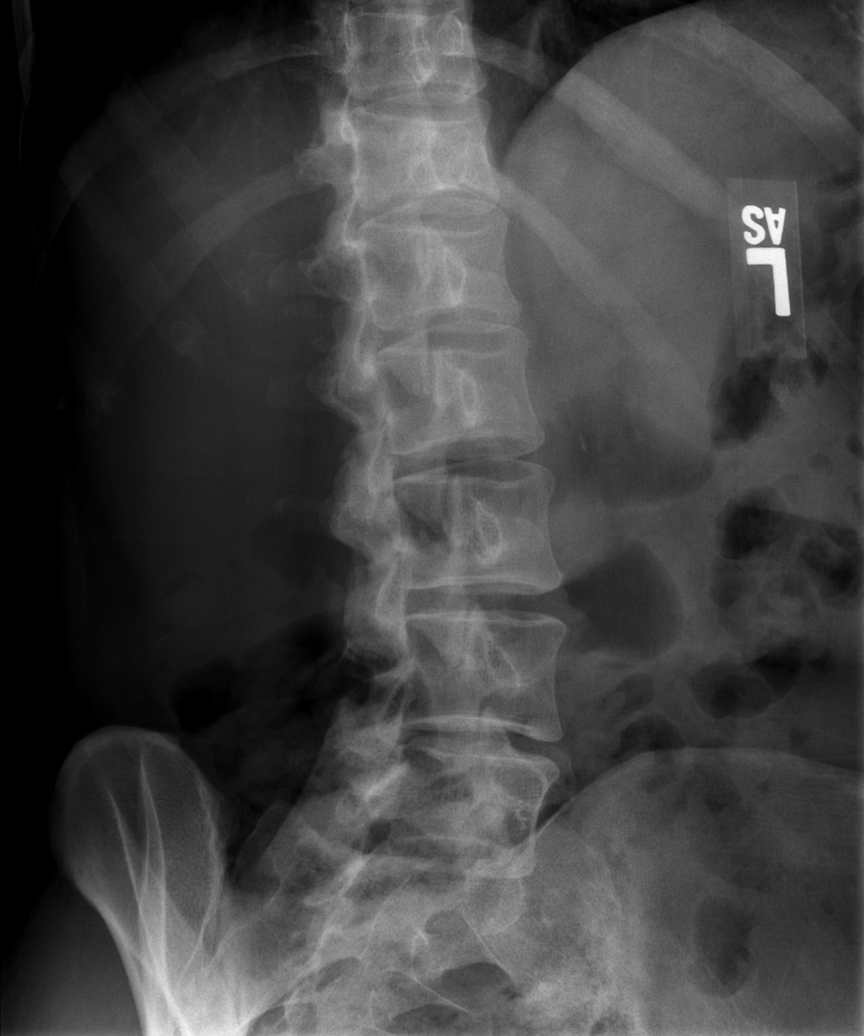

[t l-spine lat]
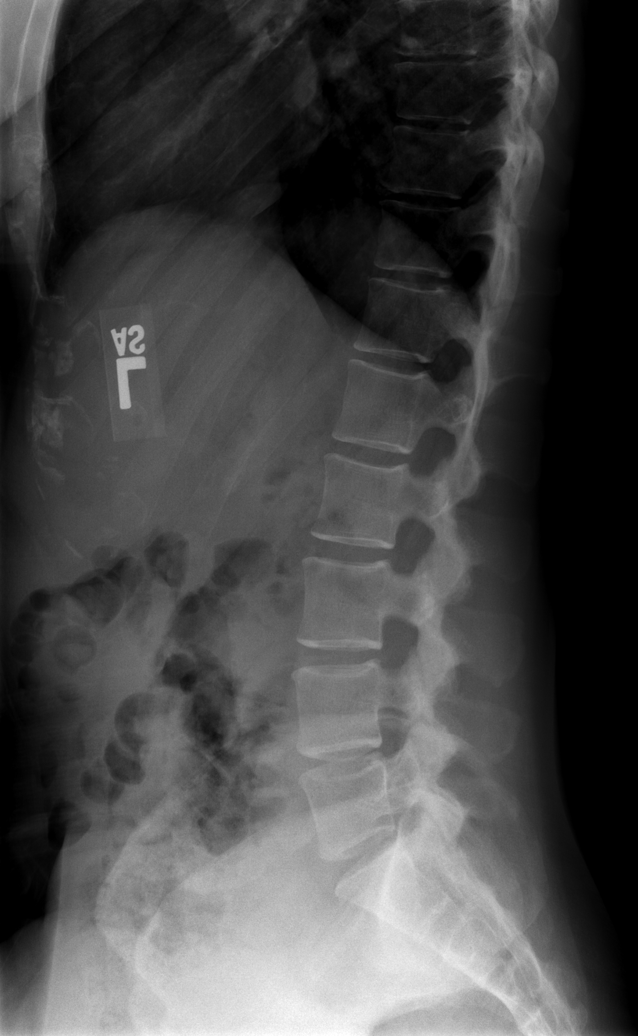

[t l-spine l5-s1 spot]
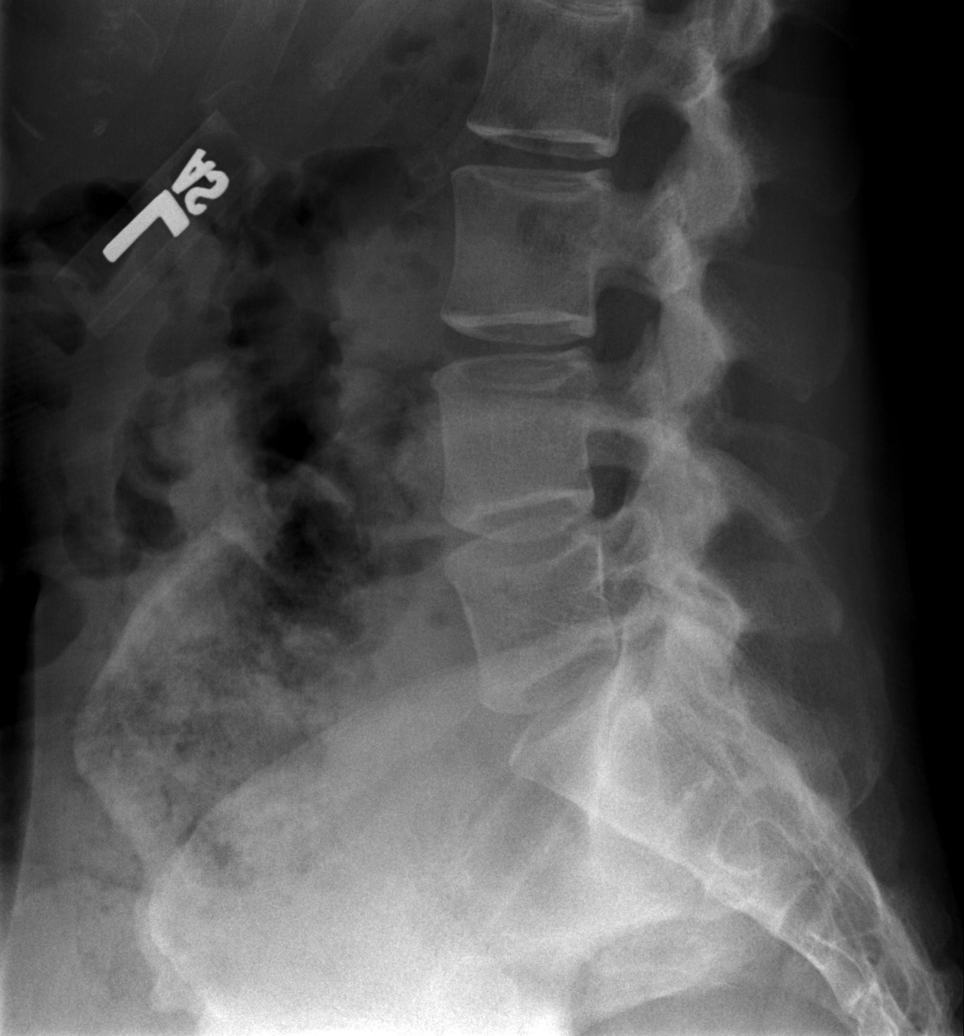

[5 of 5 positions shown; findings below may reference images not displayed]

FINDINGS: There is no evidence of lumbar spine fracture. Alignment is normal.
Intervertebral disc spaces are maintained.
IMPRESSION: Negative.

## 2020-10-13 IMAGING — CR DG CHEST 2V
1 series · 1 of 1 positions shown · non-contrast
Comparison: None.

CLINICAL DATA: Chest tightness

EXAM:
CHEST - 2 VIEW

[w chest pa]
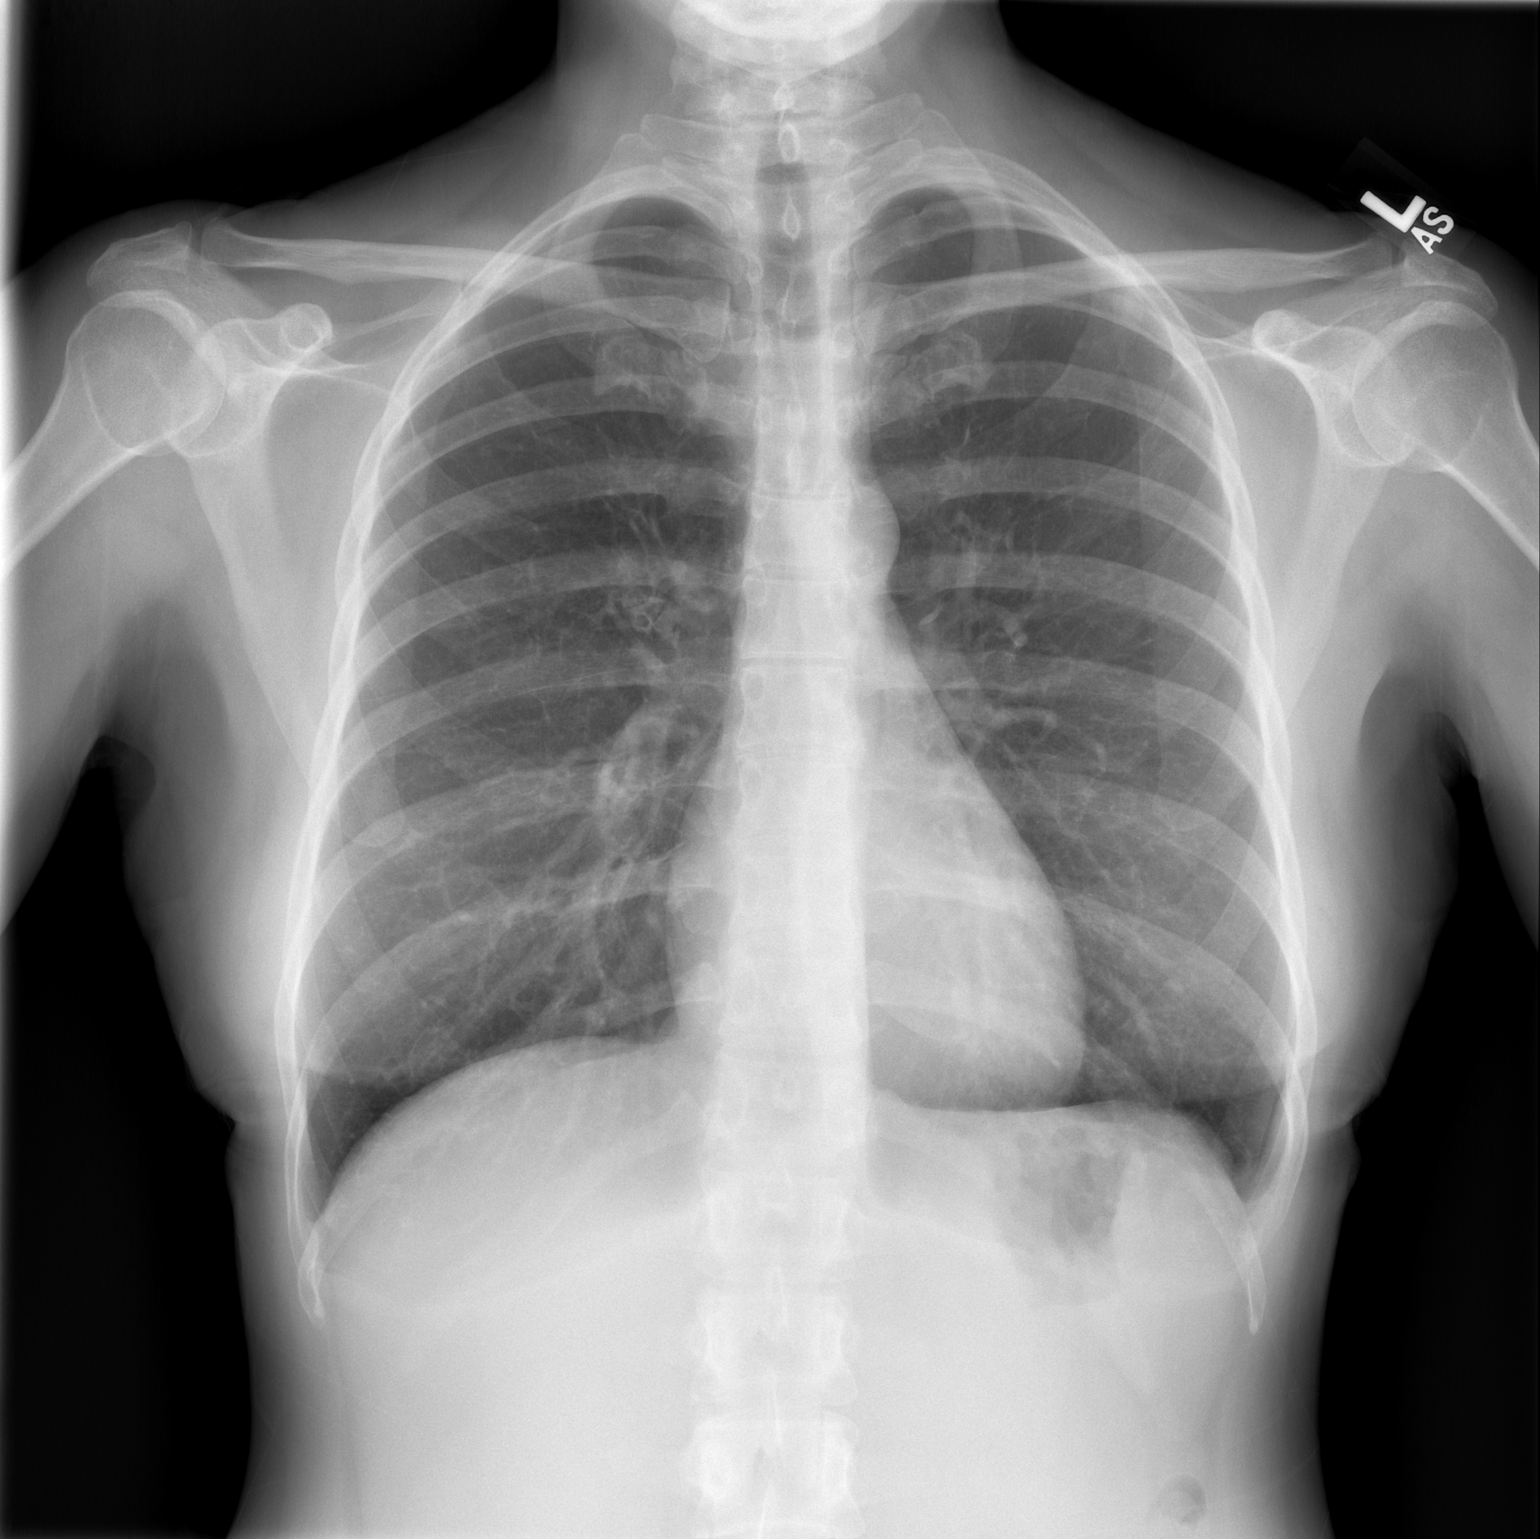

[1 of 1 positions shown; findings below may reference images not displayed]

FINDINGS: The heart size and mediastinal contours are within normal limits.
Both lungs are clear. The visualized skeletal structures are
unremarkable.
IMPRESSION: No active cardiopulmonary disease.

## 2020-11-03 ENCOUNTER — Ambulatory Visit
Admission: RE | Admit: 2020-11-03 | Discharge: 2020-11-03 | Disposition: A | Discharge status: Home / Self Care | Payer: Medicaid Other | Source: Ambulatory Visit | Attending: Family | Admitting: Family

## 2020-11-03 ENCOUNTER — Other Ambulatory Visit: Payer: Self-pay | Admitting: Family

## 2020-11-03 ENCOUNTER — Other Ambulatory Visit: Payer: Self-pay

## 2020-11-03 DIAGNOSIS — N644 Mastodynia: Secondary | ICD-10-CM

## 2020-11-03 IMAGING — US US BREAST*L* LIMITED INC AXILLA
2 series · 15 of 25 positions shown · non-contrast
Comparison: Previous exam(s).
COMPARISON: Previous exam(s).
COMPARISON: Previous exam(s).

Addendum:
CLINICAL DATA: 40-year-old female presenting with worsening left
axillary pain radiating into the lateral breast.

EXAM:
DIGITAL DIAGNOSTIC BILATERAL MAMMOGRAM WITH TOMOSYNTHESIS AND CAD;
ULTRASOUND LEFT BREAST LIMITED
TECHNIQUE: Bilateral digital diagnostic mammography and breast tomosynthesis
was performed. The images were evaluated with computer-aided
detection.; Targeted ultrasound examination of the left breast was
performed

[Series 1: us breast*left* limited inc axilla · 0.05mm/px · 24 acquisitions, 10 frames shown (1 of 2)]
[im 1/24]
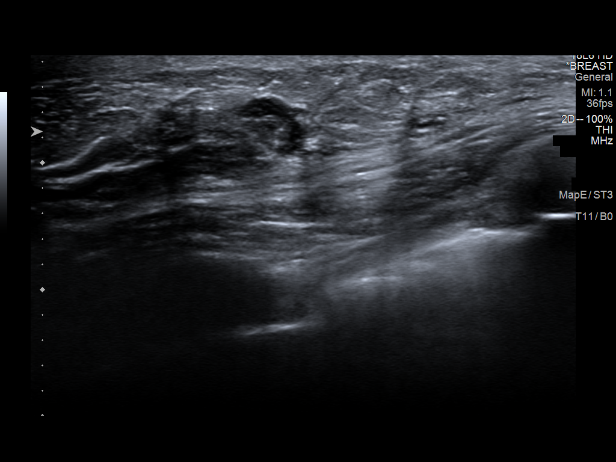
[im 3/24]
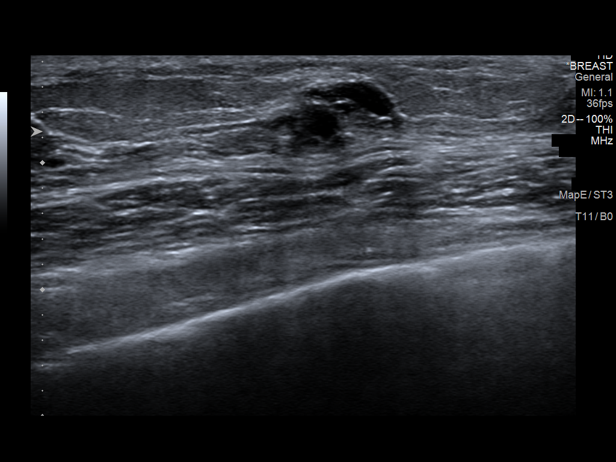
[im 6/24]
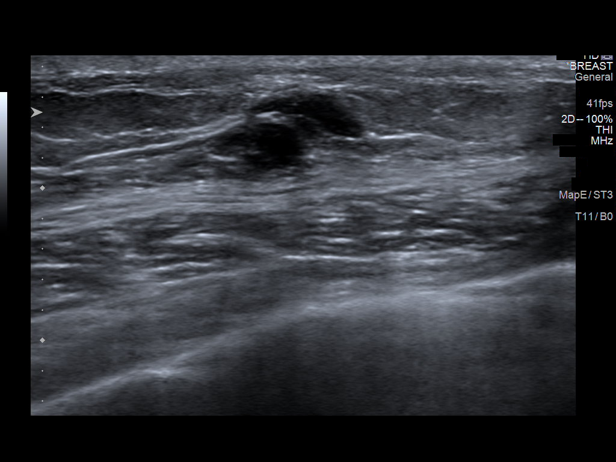
[im 8/24]
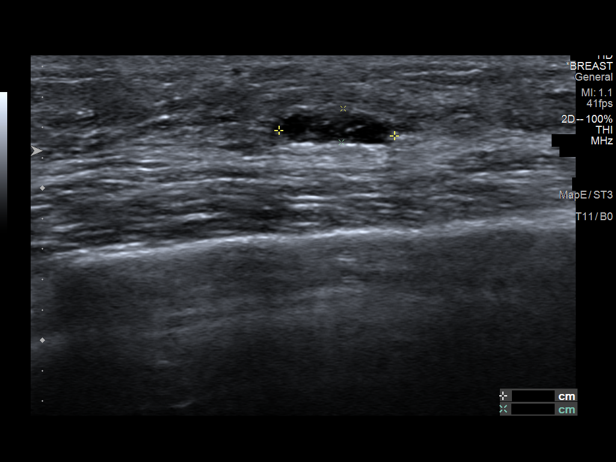
[im 11/24]
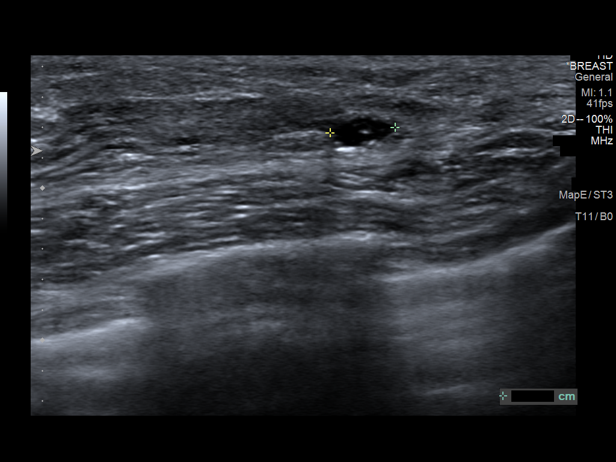
[im 13/24]
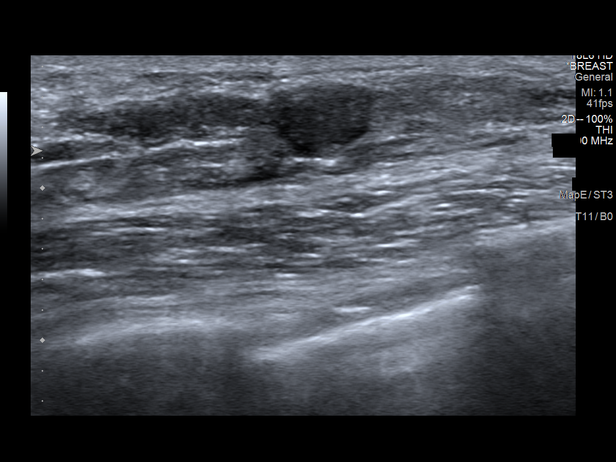
[im 15/24]
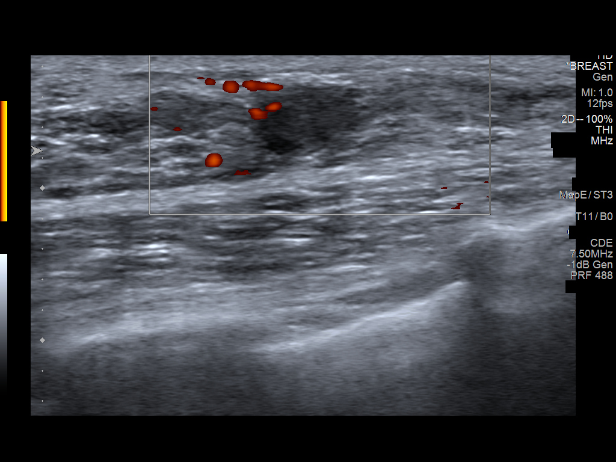
[im 18/24]
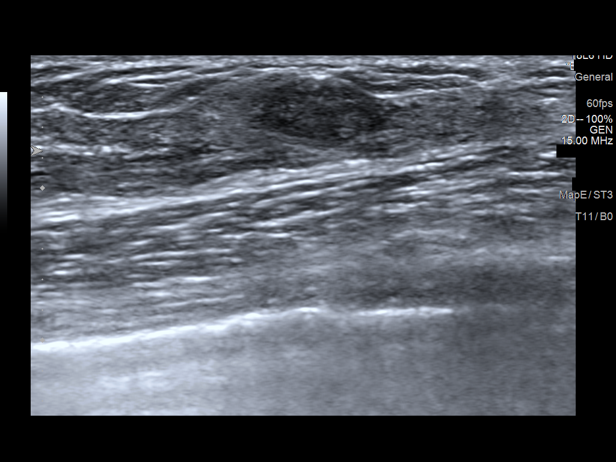
[im 21/24]
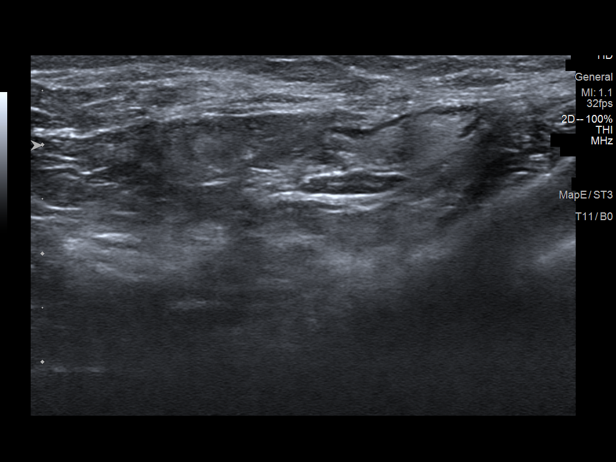
[im 22/24]
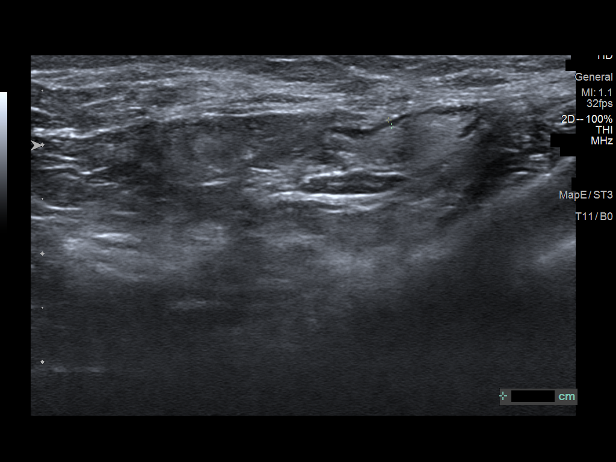

[Series 2: us breast*left* limited inc axilla · 0.06mm/px · 5 of 11 slices shown (2 of 2)]
[im 1/11]
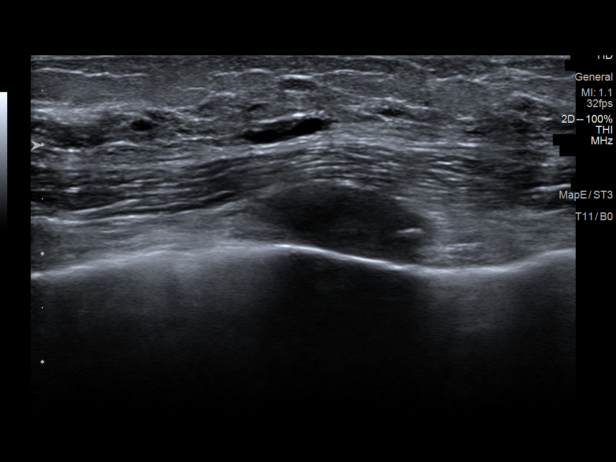
[im 3/11]
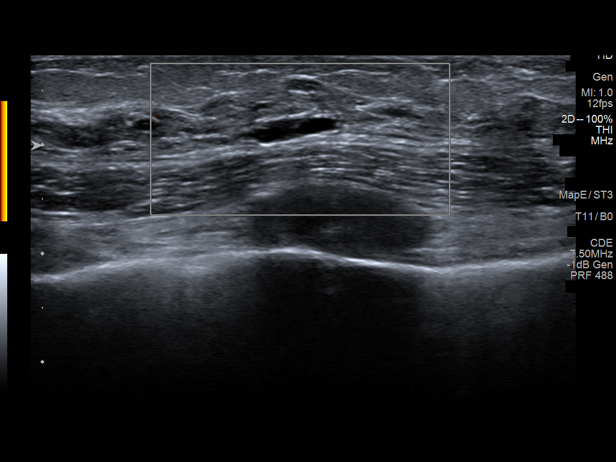
[im 5/11]
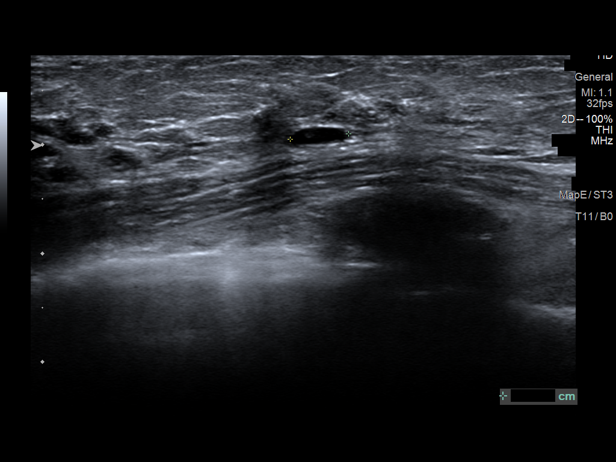
[im 8/11]
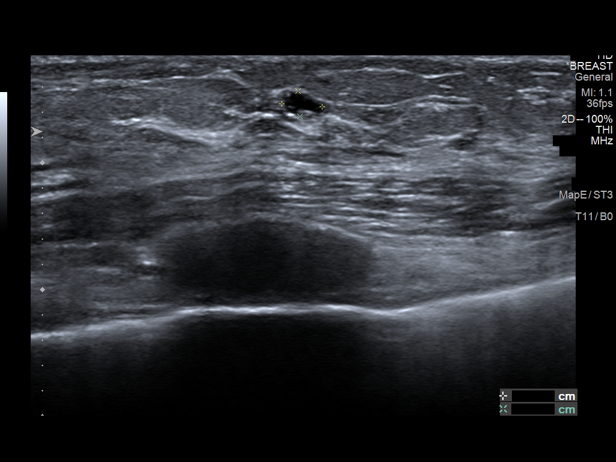
[im 11/11]
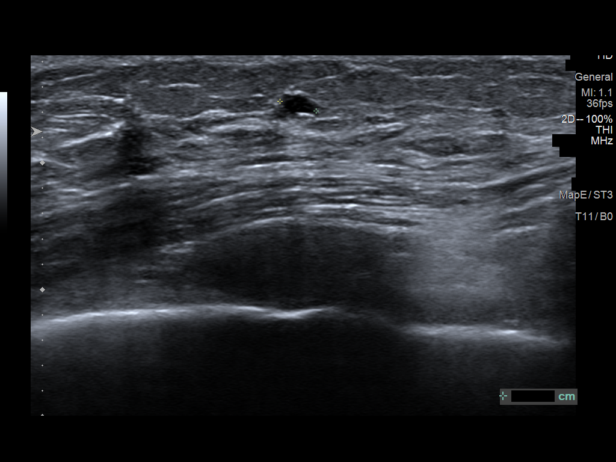

[15 of 25 positions shown; findings below may reference images not displayed]

ACR Breast Density Category d: The breast tissue is extremely dense,
which lowers the sensitivity of mammography.
FINDINGS: Mammogram:

Right breast: No suspicious mass, distortion, or microcalcifications
are identified to suggest presence of malignancy.

Left breast: Spot compression tomosynthesis views of the left breast
were performed in addition to standard views. There is no new
abnormality in the left axilla at the site of pain reported by the
patient. A small mass measuring 4 mm is identified in the lower left
breast posterior depth, which persists on the spot imaging.

Ultrasound:

Targeted ultrasound performed throughout the inferior aspect and
lateral aspect of the left breast demonstrating scattered benign
fibrocystic changes at 5 o'clock and at 4 o'clock.

At 2 o'clock 2 cm from nipple there is an oval circumscribed
hypoechoic mass measuring 0.9 x 0.5 x 0.7 cm.

Targeted ultrasound of the left axilla demonstrates multiple normal
lymph nodes.
IMPRESSION: 1. Indeterminate mass in the left breast at 2 o'clock measuring
cm.

2.  Normal left axillary ultrasound.

3.  Benign fibrocystic changes in the inferior left breast.

RECOMMENDATION:
Ultrasound-guided core needle biopsy of the left breast mass at 2
o'clock.

I have discussed the findings and recommendations with the patient
who agrees to proceed with biopsy. The patient will be scheduled for
the biopsy appointment prior to leaving the office today.

BI-RADS CATEGORY  4: Suspicious.

ADDENDUM:
When the patient return for biopsy of the left breast mass an
ultrasound of the lower inner right breast was also performed as the
small mass identified mammographically on the MLO view appeared to
project medially. The ultrasound demonstrating small scattered
benign cysts, no suspicious solid mass. One of these likely
corresponds to the 4 mm mass identified mammographically.

ADDENDUM:
With the patient return for biopsy of the left breast mass
ultrasound of the lower inner LEFT breast was also performed to
complete the diagnostic workup from [DATE]. The ultrasound of
the lower inner left breast demonstrated scattered small cysts, one
of which likely corresponds to the 4 mm mass identified
mammographically in the inferior left breast. There is no suspicious
solid mass.

*** End of Addendum ***
Addendum:
ACR Breast Density Category d: The breast tissue is extremely dense,
which lowers the sensitivity of mammography.
FINDINGS: Mammogram:

Right breast: No suspicious mass, distortion, or microcalcifications
are identified to suggest presence of malignancy.

Left breast: Spot compression tomosynthesis views of the left breast
were performed in addition to standard views. There is no new
abnormality in the left axilla at the site of pain reported by the
patient. A small mass measuring 4 mm is identified in the lower left
breast posterior depth, which persists on the spot imaging.

Ultrasound:

Targeted ultrasound performed throughout the inferior aspect and
lateral aspect of the left breast demonstrating scattered benign
fibrocystic changes at 5 o'clock and at 4 o'clock.

At 2 o'clock 2 cm from nipple there is an oval circumscribed
hypoechoic mass measuring 0.9 x 0.5 x 0.7 cm.

Targeted ultrasound of the left axilla demonstrates multiple normal
lymph nodes.
IMPRESSION: 1. Indeterminate mass in the left breast at 2 o'clock measuring
cm.

2.  Normal left axillary ultrasound.

3.  Benign fibrocystic changes in the inferior left breast.

RECOMMENDATION:
Ultrasound-guided core needle biopsy of the left breast mass at 2
o'clock.

I have discussed the findings and recommendations with the patient
who agrees to proceed with biopsy. The patient will be scheduled for
the biopsy appointment prior to leaving the office today.

BI-RADS CATEGORY  4: Suspicious.

ADDENDUM:
When the patient return for biopsy of the left breast mass an
ultrasound of the lower inner right breast was also performed as the
small mass identified mammographically on the MLO view appeared to
project medially. The ultrasound demonstrating small scattered
benign cysts, no suspicious solid mass. One of these likely
corresponds to the 4 mm mass identified mammographically.

*** End of Addendum ***
ACR Breast Density Category d: The breast tissue is extremely dense,
which lowers the sensitivity of mammography.
FINDINGS: Mammogram:

Right breast: No suspicious mass, distortion, or microcalcifications
are identified to suggest presence of malignancy.

Left breast: Spot compression tomosynthesis views of the left breast
were performed in addition to standard views. There is no new
abnormality in the left axilla at the site of pain reported by the
patient. A small mass measuring 4 mm is identified in the lower left
breast posterior depth, which persists on the spot imaging.

Ultrasound:

Targeted ultrasound performed throughout the inferior aspect and
lateral aspect of the left breast demonstrating scattered benign
fibrocystic changes at 5 o'clock and at 4 o'clock.

At 2 o'clock 2 cm from nipple there is an oval circumscribed
hypoechoic mass measuring 0.9 x 0.5 x 0.7 cm.

Targeted ultrasound of the left axilla demonstrates multiple normal
lymph nodes.
IMPRESSION: 1. Indeterminate mass in the left breast at 2 o'clock measuring
cm.

2.  Normal left axillary ultrasound.

3.  Benign fibrocystic changes in the inferior left breast.

RECOMMENDATION:
Ultrasound-guided core needle biopsy of the left breast mass at 2
o'clock.

I have discussed the findings and recommendations with the patient
who agrees to proceed with biopsy. The patient will be scheduled for
the biopsy appointment prior to leaving the office today.

BI-RADS CATEGORY  4: Suspicious.

## 2020-11-03 IMAGING — MG DIGITAL DIAGNOSTIC BILAT W/ TOMO W/ CAD
7 of 14 series · 7 of 40 positions shown · non-contrast
Comparison: Previous exam(s).
COMPARISON: Previous exam(s).
COMPARISON: Previous exam(s).

Addendum:
CLINICAL DATA: 40-year-old female presenting with worsening left
axillary pain radiating into the lateral breast.

EXAM:
DIGITAL DIAGNOSTIC BILATERAL MAMMOGRAM WITH TOMOSYNTHESIS AND CAD;
ULTRASOUND LEFT BREAST LIMITED
TECHNIQUE: Bilateral digital diagnostic mammography and breast tomosynthesis
was performed. The images were evaluated with computer-aided
detection.; Targeted ultrasound examination of the left breast was
performed

[R CC synth-2D]
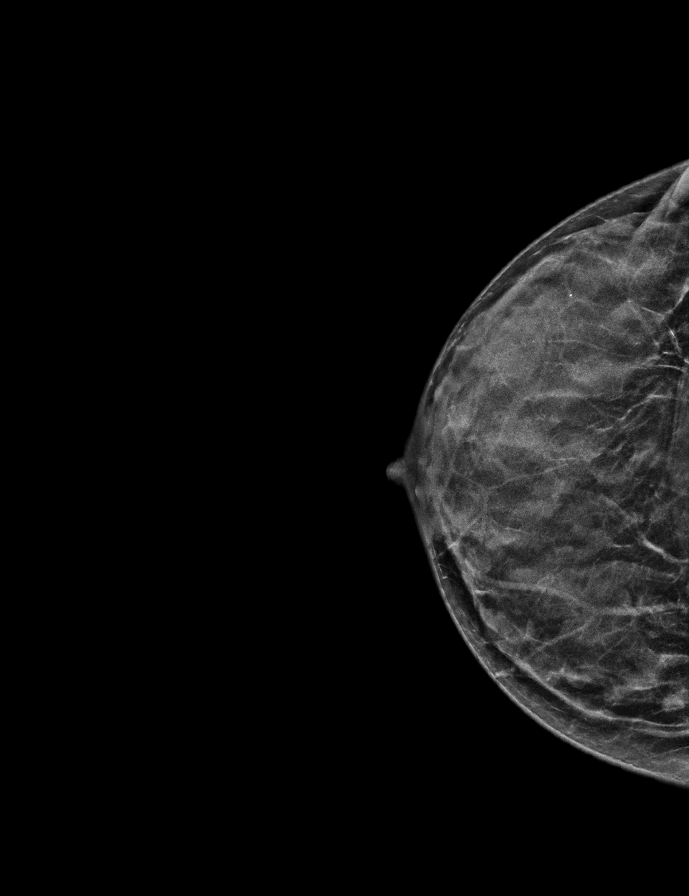

[L ML synth-2D]
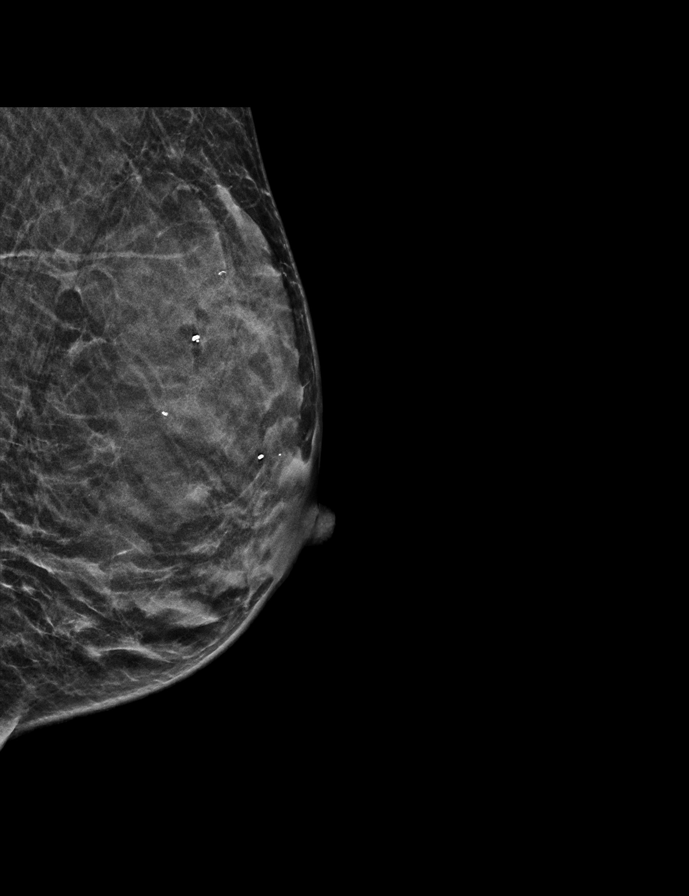

[L MLO synth-2D (1 of 3)]
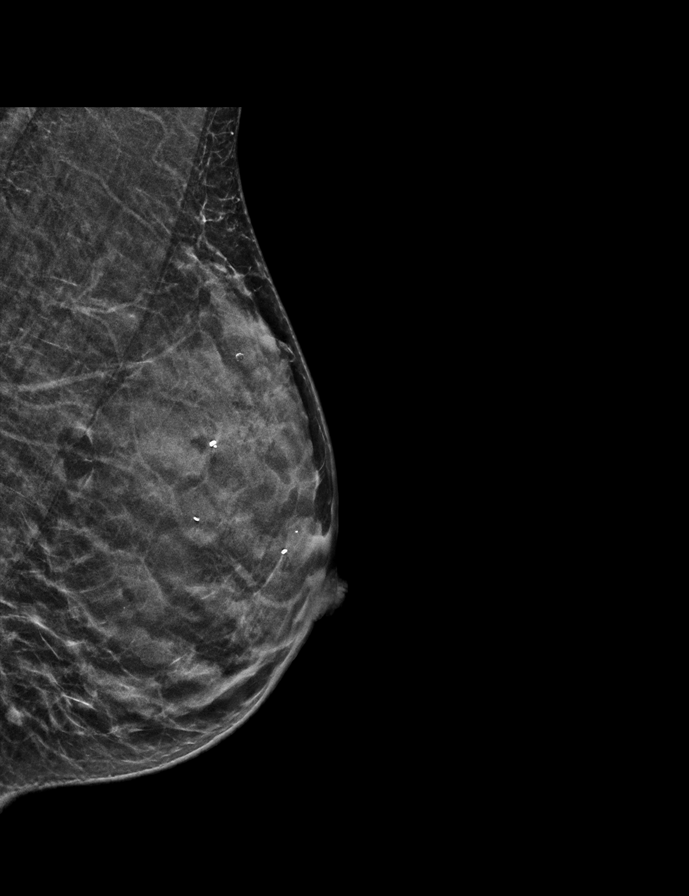

[L MLO synth-2D (2 of 3)]
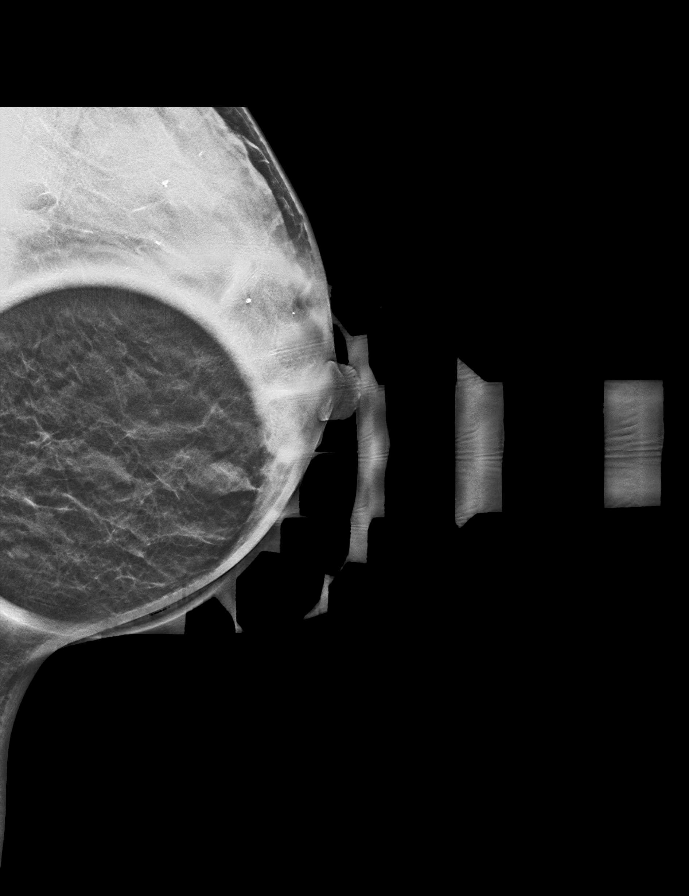

[R MLO synth-2D]
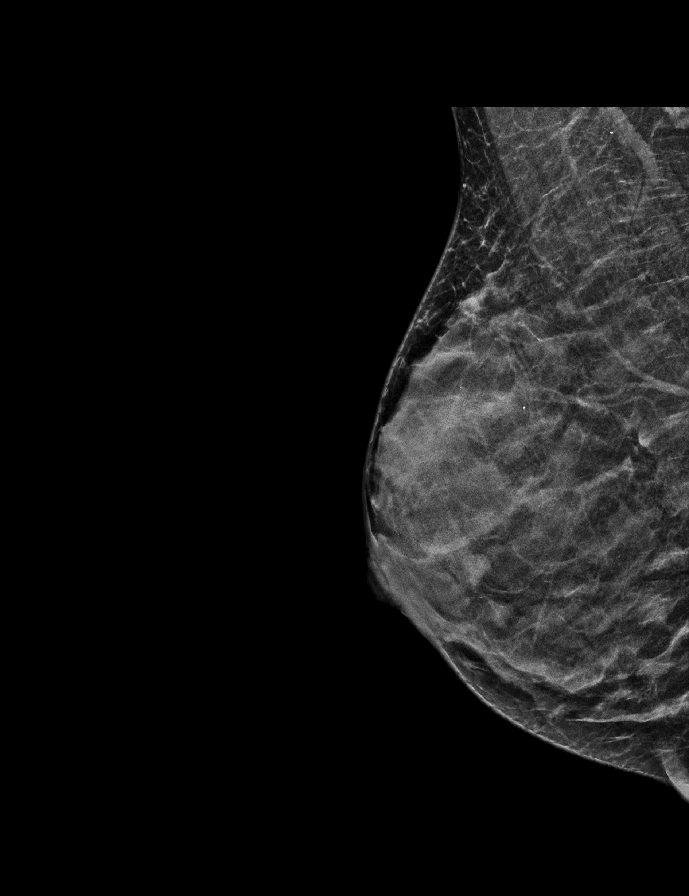

[L CC synth-2D]
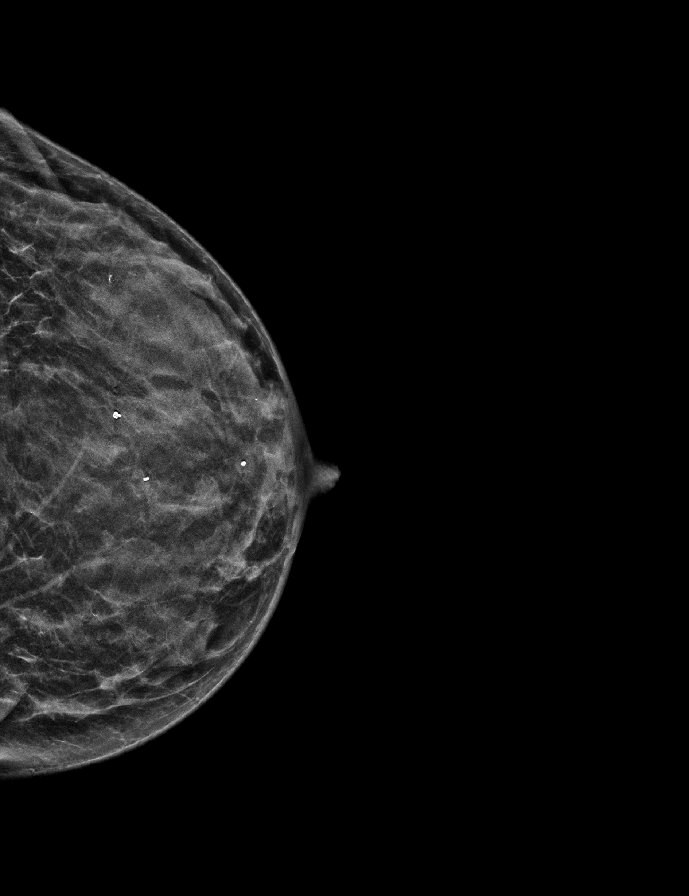

[L MLO synth-2D (3 of 3)]
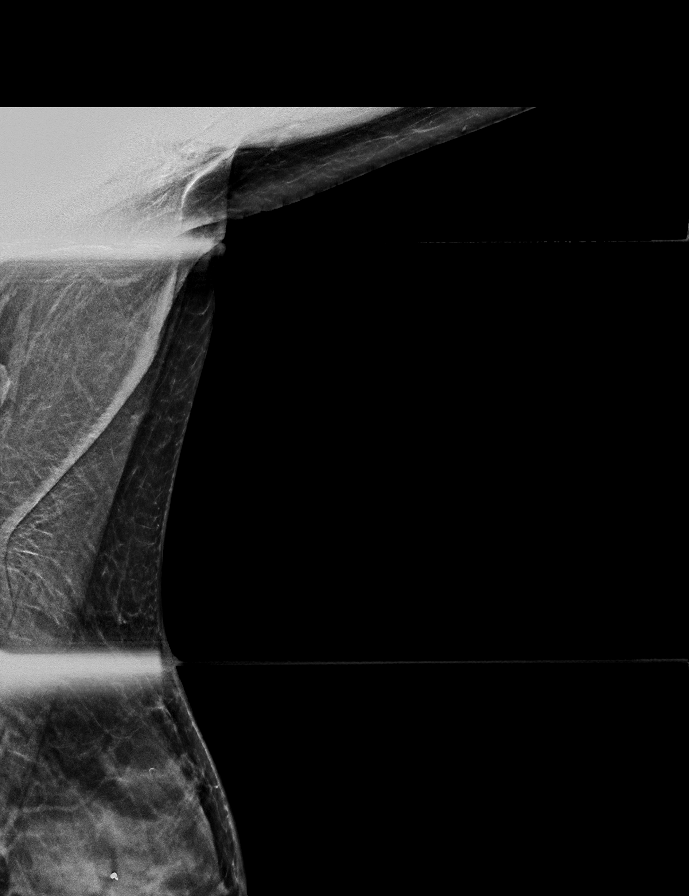

[7 of 40 positions shown; findings below may reference images not displayed]

ACR Breast Density Category d: The breast tissue is extremely dense,
which lowers the sensitivity of mammography.
FINDINGS: Mammogram:

Right breast: No suspicious mass, distortion, or microcalcifications
are identified to suggest presence of malignancy.

Left breast: Spot compression tomosynthesis views of the left breast
were performed in addition to standard views. There is no new
abnormality in the left axilla at the site of pain reported by the
patient. A small mass measuring 4 mm is identified in the lower left
breast posterior depth, which persists on the spot imaging.

Ultrasound:

Targeted ultrasound performed throughout the inferior aspect and
lateral aspect of the left breast demonstrating scattered benign
fibrocystic changes at 5 o'clock and at 4 o'clock.

At 2 o'clock 2 cm from nipple there is an oval circumscribed
hypoechoic mass measuring 0.9 x 0.5 x 0.7 cm.

Targeted ultrasound of the left axilla demonstrates multiple normal
lymph nodes.
IMPRESSION: 1. Indeterminate mass in the left breast at 2 o'clock measuring
cm.

2.  Normal left axillary ultrasound.

3.  Benign fibrocystic changes in the inferior left breast.

RECOMMENDATION:
Ultrasound-guided core needle biopsy of the left breast mass at 2
o'clock.

I have discussed the findings and recommendations with the patient
who agrees to proceed with biopsy. The patient will be scheduled for
the biopsy appointment prior to leaving the office today.

BI-RADS CATEGORY  4: Suspicious.

ADDENDUM:
When the patient return for biopsy of the left breast mass an
ultrasound of the lower inner right breast was also performed as the
small mass identified mammographically on the MLO view appeared to
project medially. The ultrasound demonstrating small scattered
benign cysts, no suspicious solid mass. One of these likely
corresponds to the 4 mm mass identified mammographically.

ADDENDUM:
With the patient return for biopsy of the left breast mass
ultrasound of the lower inner LEFT breast was also performed to
complete the diagnostic workup from [DATE]. The ultrasound of
the lower inner left breast demonstrated scattered small cysts, one
of which likely corresponds to the 4 mm mass identified
mammographically in the inferior left breast. There is no suspicious
solid mass.

*** End of Addendum ***
Addendum:
ACR Breast Density Category d: The breast tissue is extremely dense,
which lowers the sensitivity of mammography.
FINDINGS: Mammogram:

Right breast: No suspicious mass, distortion, or microcalcifications
are identified to suggest presence of malignancy.

Left breast: Spot compression tomosynthesis views of the left breast
were performed in addition to standard views. There is no new
abnormality in the left axilla at the site of pain reported by the
patient. A small mass measuring 4 mm is identified in the lower left
breast posterior depth, which persists on the spot imaging.

Ultrasound:

Targeted ultrasound performed throughout the inferior aspect and
lateral aspect of the left breast demonstrating scattered benign
fibrocystic changes at 5 o'clock and at 4 o'clock.

At 2 o'clock 2 cm from nipple there is an oval circumscribed
hypoechoic mass measuring 0.9 x 0.5 x 0.7 cm.

Targeted ultrasound of the left axilla demonstrates multiple normal
lymph nodes.
IMPRESSION: 1. Indeterminate mass in the left breast at 2 o'clock measuring
cm.

2.  Normal left axillary ultrasound.

3.  Benign fibrocystic changes in the inferior left breast.

RECOMMENDATION:
Ultrasound-guided core needle biopsy of the left breast mass at 2
o'clock.

I have discussed the findings and recommendations with the patient
who agrees to proceed with biopsy. The patient will be scheduled for
the biopsy appointment prior to leaving the office today.

BI-RADS CATEGORY  4: Suspicious.

ADDENDUM:
When the patient return for biopsy of the left breast mass an
ultrasound of the lower inner right breast was also performed as the
small mass identified mammographically on the MLO view appeared to
project medially. The ultrasound demonstrating small scattered
benign cysts, no suspicious solid mass. One of these likely
corresponds to the 4 mm mass identified mammographically.

*** End of Addendum ***
ACR Breast Density Category d: The breast tissue is extremely dense,
which lowers the sensitivity of mammography.
FINDINGS: Mammogram:

Right breast: No suspicious mass, distortion, or microcalcifications
are identified to suggest presence of malignancy.

Left breast: Spot compression tomosynthesis views of the left breast
were performed in addition to standard views. There is no new
abnormality in the left axilla at the site of pain reported by the
patient. A small mass measuring 4 mm is identified in the lower left
breast posterior depth, which persists on the spot imaging.

Ultrasound:

Targeted ultrasound performed throughout the inferior aspect and
lateral aspect of the left breast demonstrating scattered benign
fibrocystic changes at 5 o'clock and at 4 o'clock.

At 2 o'clock 2 cm from nipple there is an oval circumscribed
hypoechoic mass measuring 0.9 x 0.5 x 0.7 cm.

Targeted ultrasound of the left axilla demonstrates multiple normal
lymph nodes.
IMPRESSION: 1. Indeterminate mass in the left breast at 2 o'clock measuring
cm.

2.  Normal left axillary ultrasound.

3.  Benign fibrocystic changes in the inferior left breast.

RECOMMENDATION:
Ultrasound-guided core needle biopsy of the left breast mass at 2
o'clock.

I have discussed the findings and recommendations with the patient
who agrees to proceed with biopsy. The patient will be scheduled for
the biopsy appointment prior to leaving the office today.

BI-RADS CATEGORY  4: Suspicious.

## 2020-11-04 ENCOUNTER — Ambulatory Visit
Admission: RE | Admit: 2020-11-04 | Discharge: 2020-11-04 | Disposition: A | Discharge status: Home / Self Care | Payer: Medicaid Other | Source: Ambulatory Visit | Attending: Family | Admitting: Family

## 2020-11-04 DIAGNOSIS — N644 Mastodynia: Secondary | ICD-10-CM

## 2020-11-04 IMAGING — US US BREAST BX W LOC DEV 1ST LESION IMG BX SPEC US GUIDE*L*
1 series · 12 of 15 positions shown · non-contrast
Comparison: Previous exam(s).
COMPARISON: Previous exam(s).

Addendum:
CLINICAL DATA: Left breast mass.

EXAM:
ULTRASOUND GUIDED LEFT BREAST CORE NEEDLE BIOPSY

[Series 1: us breast bx w loc dev 1st lesion img bx spec us g · 0.03mm/px · 12 of 15 slices shown]
[im 1/15]
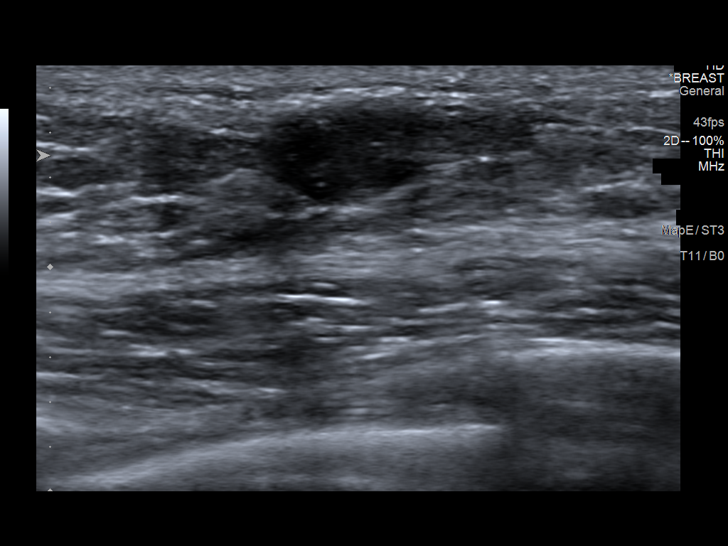
[im 2/15]
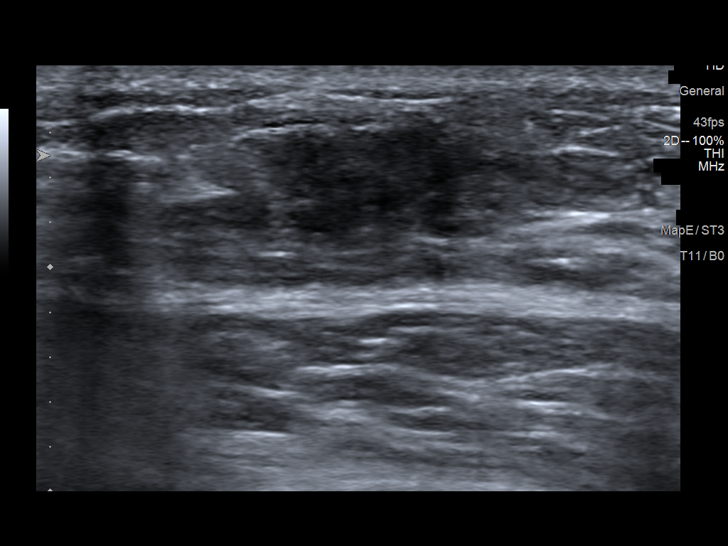
[im 4/15]
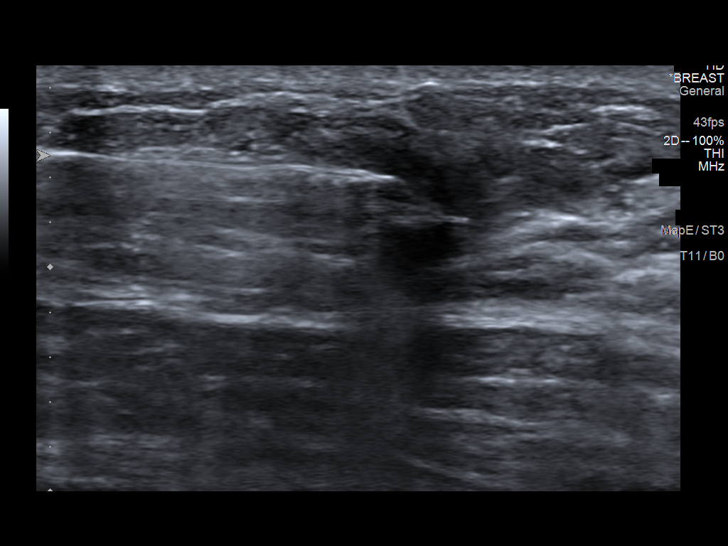
[im 5/15]
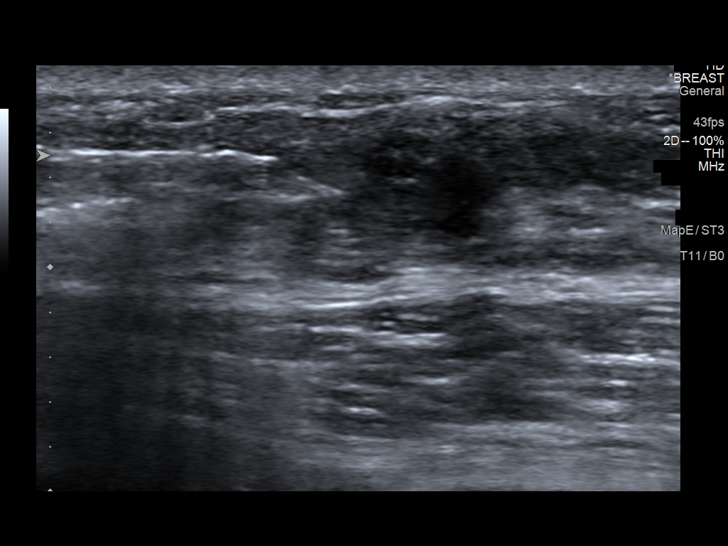
[im 6/15]
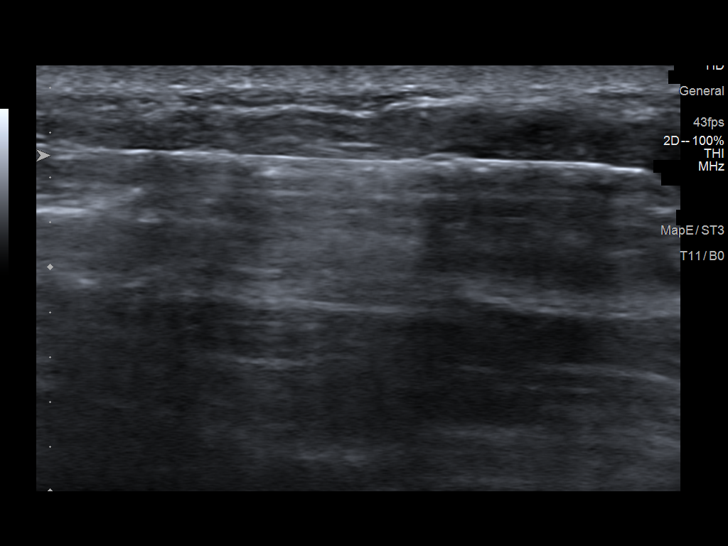
[im 7/15]
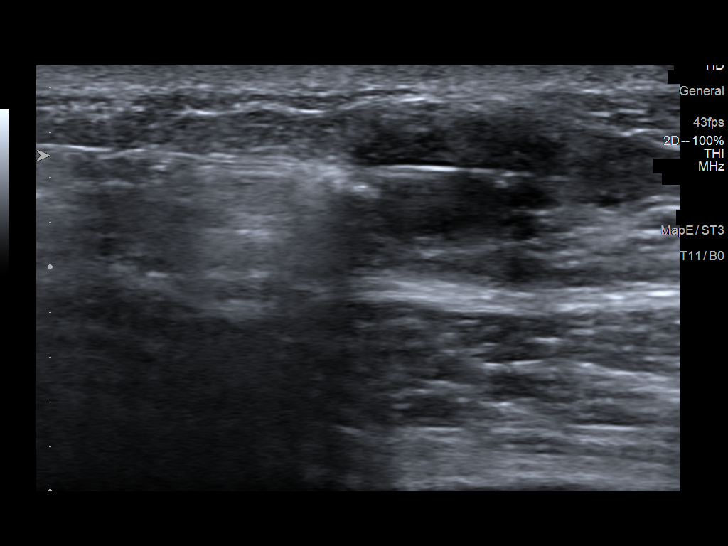
[im 9/15]
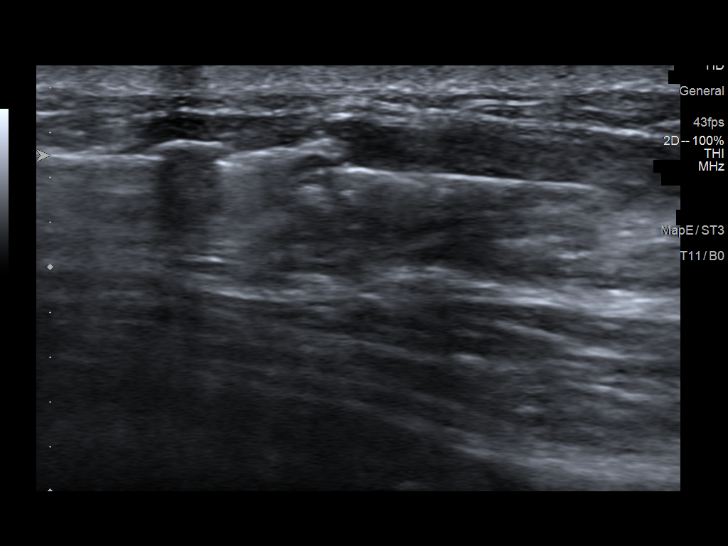
[im 10/15]
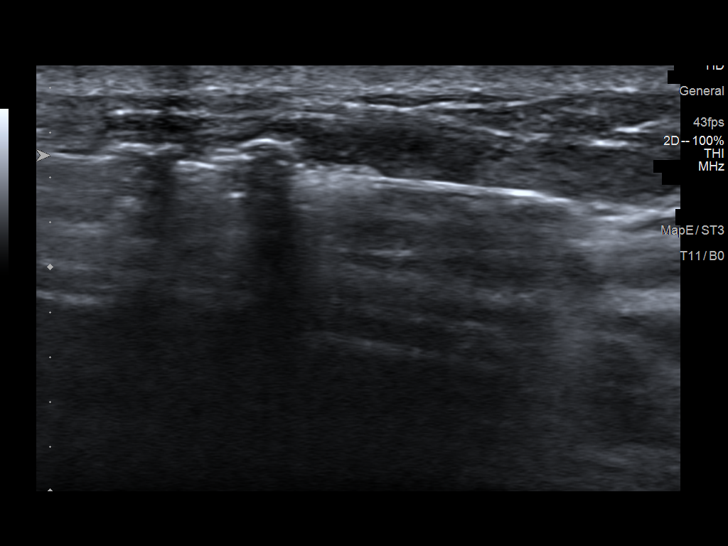
[im 11/15]
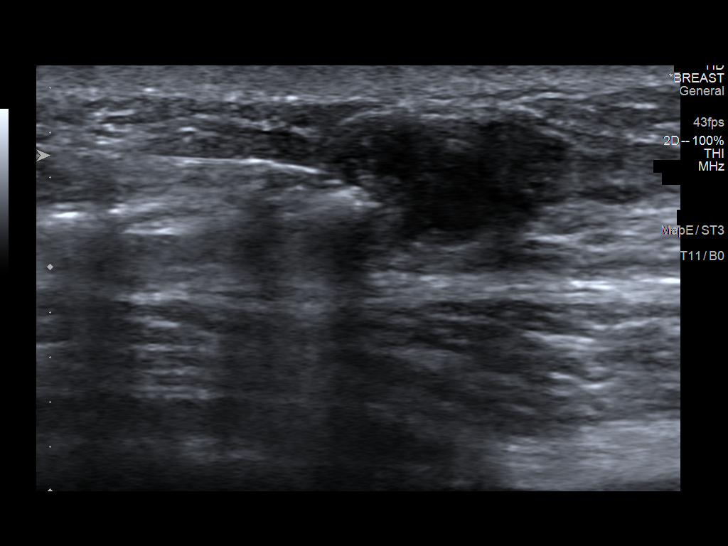
[im 12/15]
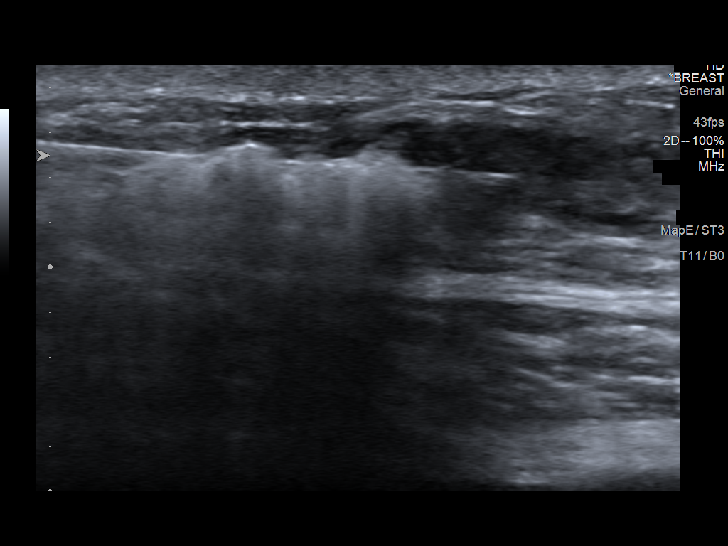
[im 14/15]
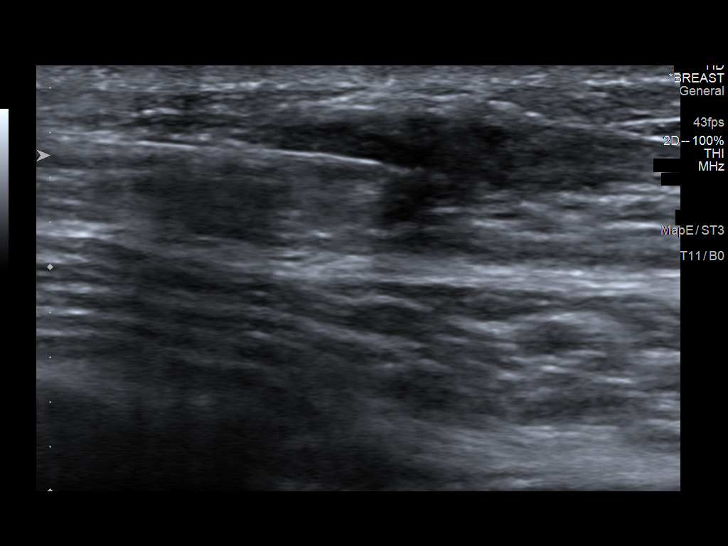
[im 15/15]
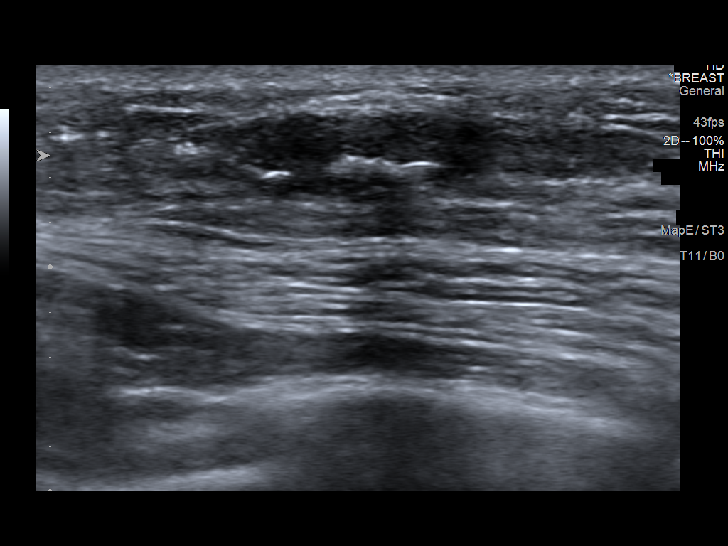

[12 of 15 positions shown; findings below may reference images not displayed]



Lesion quadrant: Upper-outer quadrant

Using sterile technique and 1% Lidocaine as local anesthetic, under
direct ultrasound visualization, a 14 gauge LIBENA device was
used to perform biopsy of a mass in the 2 o'clock region of the left
breast using a lateral to medial approach. At the conclusion of the
procedure ribbon shaped tissue marker clip was deployed into the
biopsy cavity. Follow up 2 view mammogram was performed and dictated
separately.
IMPRESSION: Ultrasound guided biopsy of the left breast. No apparent
complications.

ADDENDUM:
Pathology revealed FIBROADENOMA- NO MALIGNANCY IDENTIFIED of the
LEFT breast, 2 o'clock. This was found to be concordant by Dr. LIBENA
LIBENA.

Pathology results were discussed with the patient by telephone. The
patient reported doing well after the biopsy with tenderness at the
site. Post biopsy instructions and care were reviewed and questions
were answered. The patient was encouraged to call The [REDACTED]

The patient was instructed to return for annual screening
mammography and informed a reminder notice would be sent regarding
this appointment.

Consider high risk screening and genetics counseling due to
extremely dense breast tissue and family history. The American
Cancer Society recommends annual MRI and mammography in patients
with an estimated lifetime risk of developing breast cancer greater
than 20 - 25%, or who are known or suspected to be positive for the
breast cancer gene.

Pathology results reported by LIBENA RN on [DATE].



Lesion quadrant: Upper-outer quadrant

Using sterile technique and 1% Lidocaine as local anesthetic, under
direct ultrasound visualization, a 14 gauge LIBENA device was
used to perform biopsy of a mass in the 2 o'clock region of the left
breast using a lateral to medial approach. At the conclusion of the
procedure ribbon shaped tissue marker clip was deployed into the
biopsy cavity. Follow up 2 view mammogram was performed and dictated
separately.
IMPRESSION: Ultrasound guided biopsy of the left breast. No apparent
complications.

## 2020-11-04 IMAGING — MG MM BREAST LOCALIZATION CLIP
4 series · 4 of 12 positions shown · non-contrast
Comparison: Previous exam(s).

CLINICAL DATA: Status post ultrasound-guided core biopsy of the
left breast.

EXAM:
3D DIAGNOSTIC LEFT MAMMOGRAM POST ULTRASOUND BIOPSY

[L ML synth-2D]
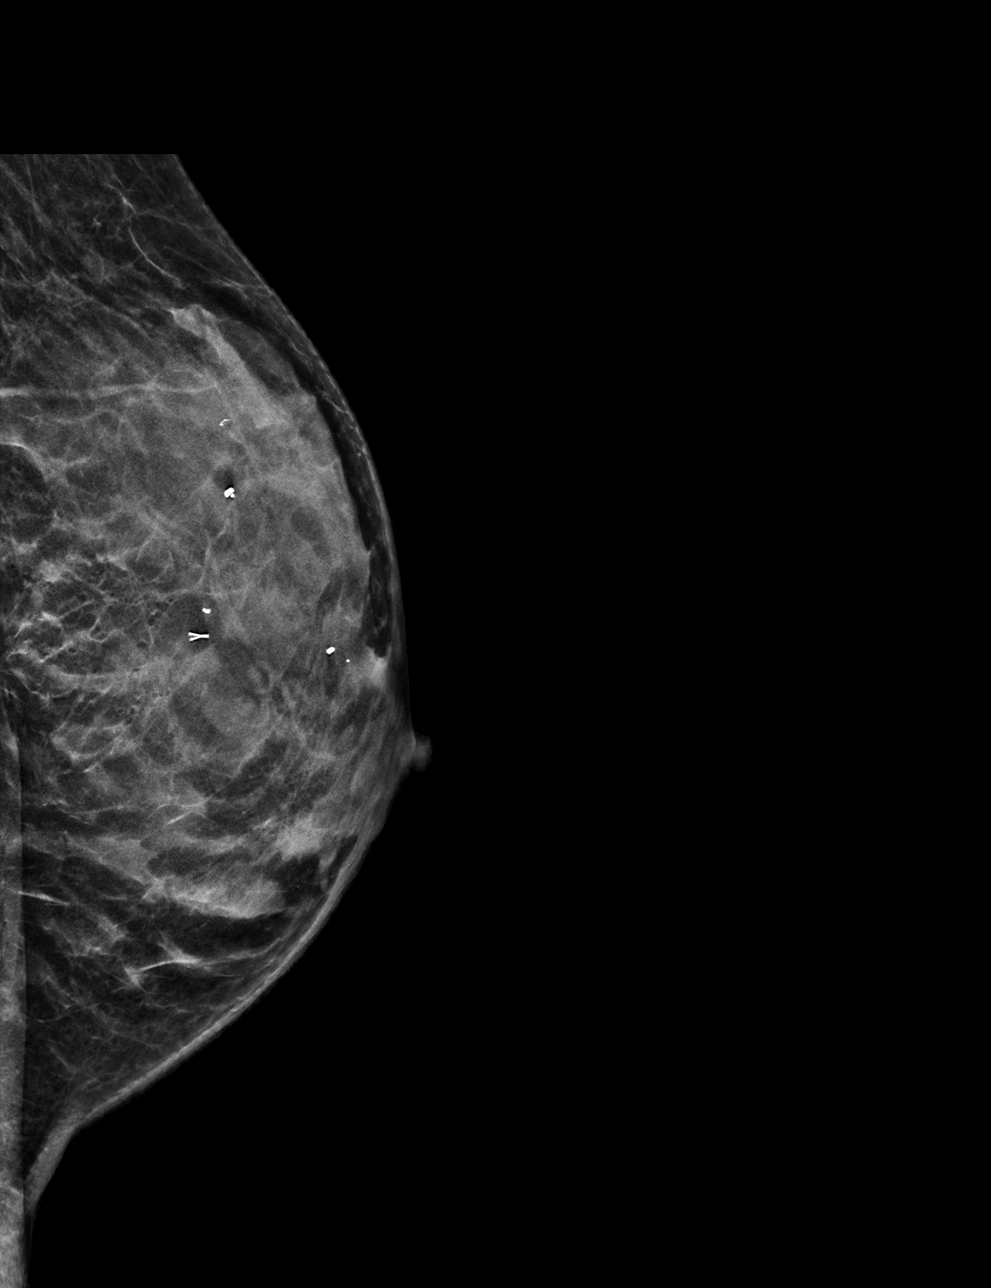

[L CC synth-2D]
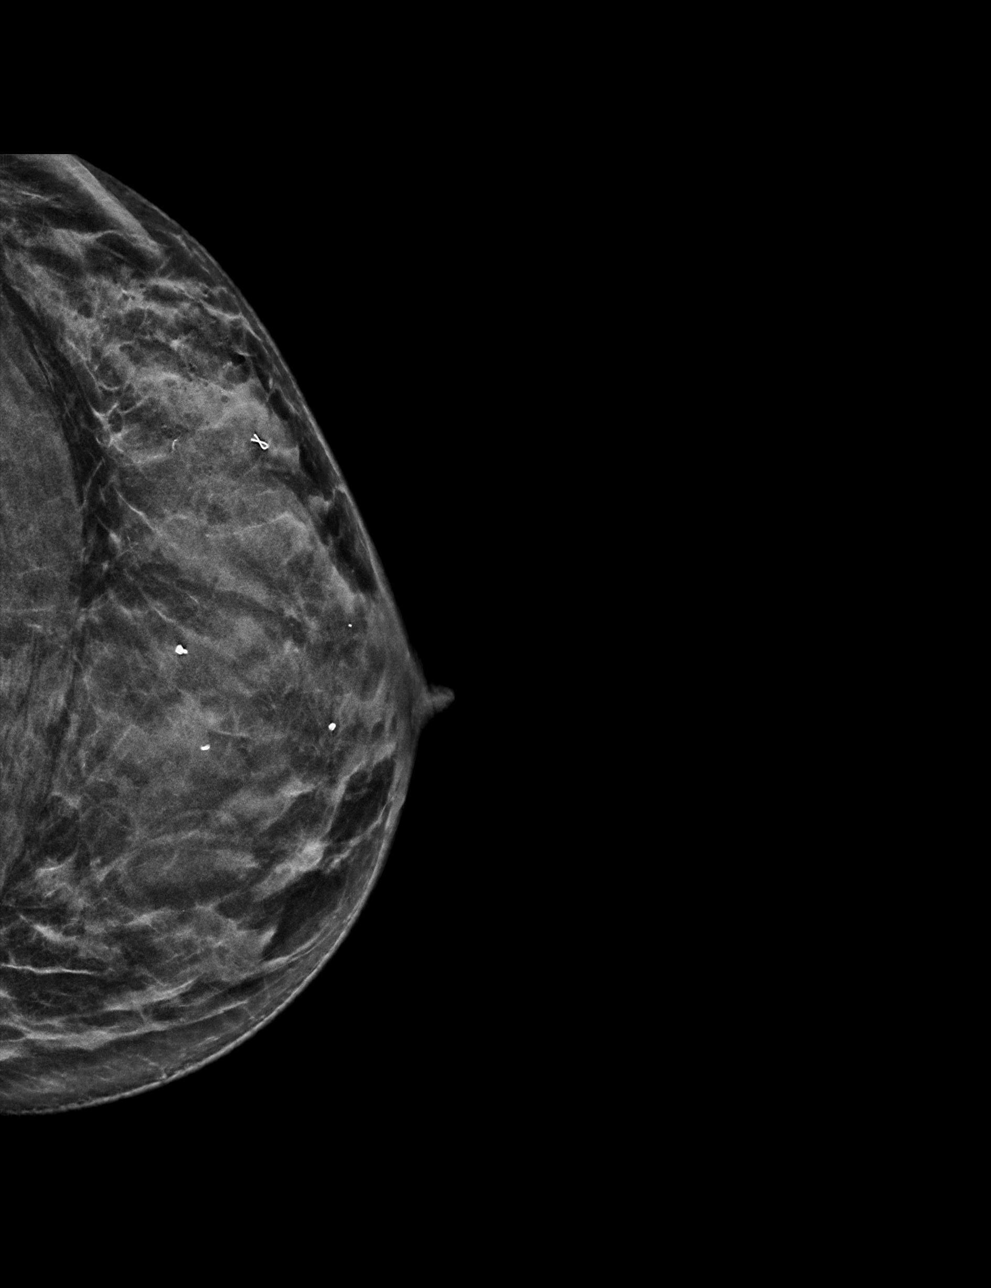

[L ML tomo · tomo slice 21/41.0]
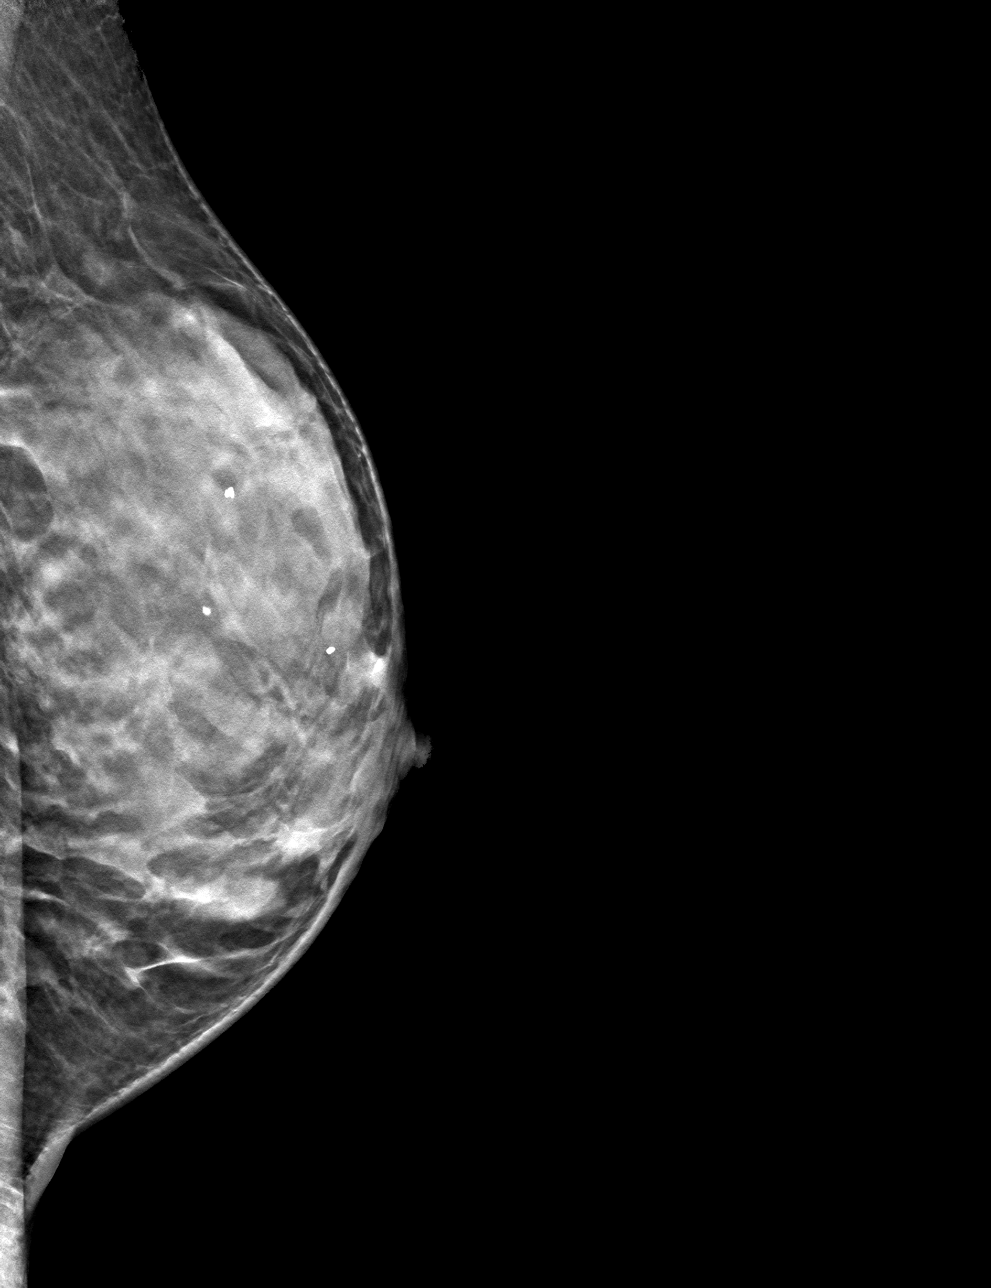

[L CC tomo · tomo slice 23/45.0]
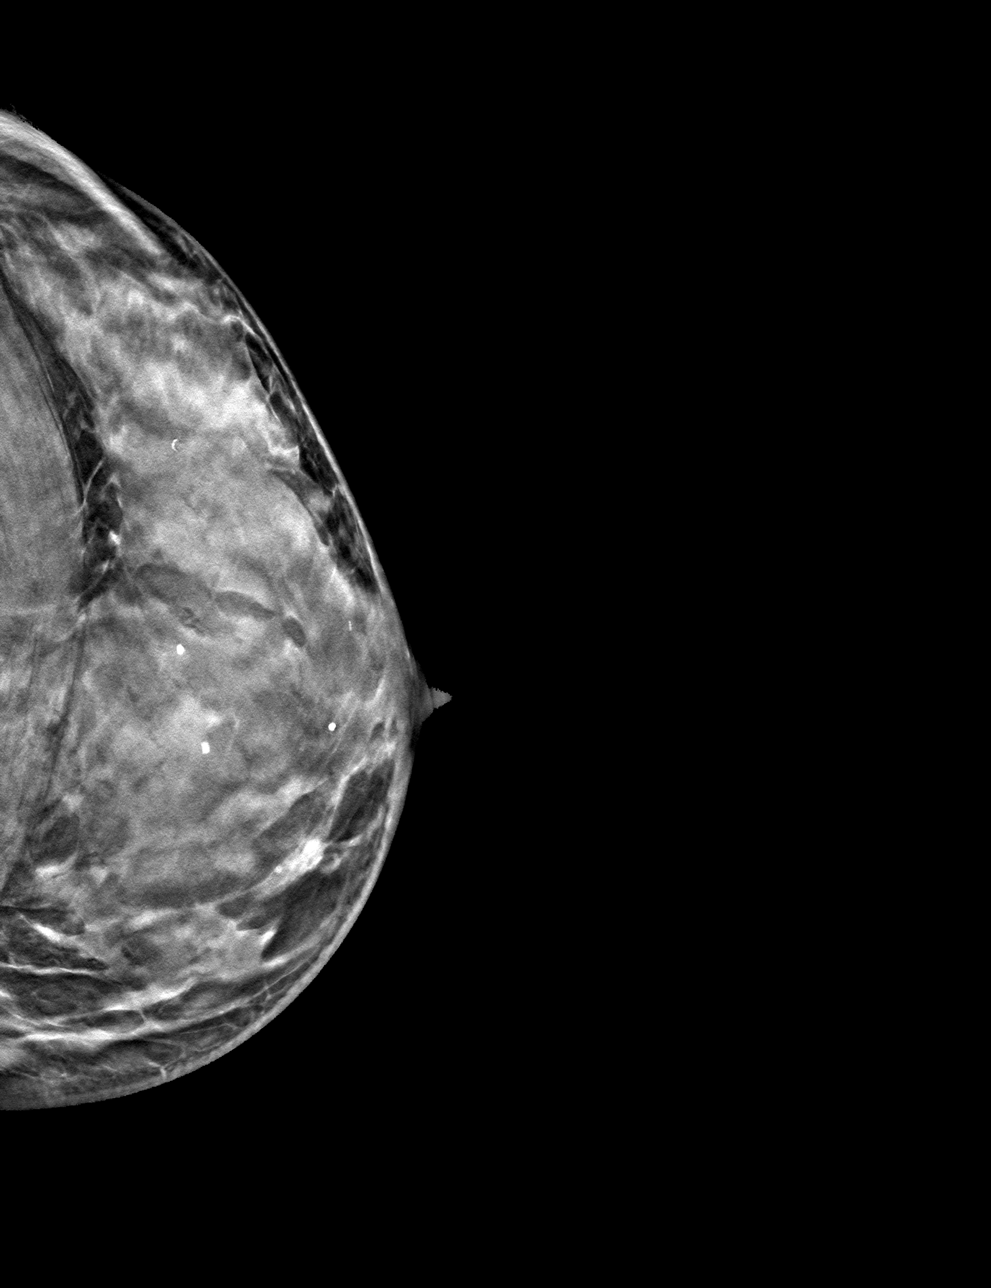

[4 of 12 positions shown; findings below may reference images not displayed]

FINDINGS: 3D Mammographic images were obtained following ultrasound guided
biopsy of the left breast. The biopsy marking clip is in expected
location in the upper-outer quadrant of the left breast.
IMPRESSION: Appropriate positioning of the ribbon shaped biopsy marking clip at
the site of biopsy in the upper-outer quadrant of the left breast.

Final Assessment: Post Procedure Mammograms for Marker Placement

## 2020-11-09 ENCOUNTER — Ambulatory Visit: Payer: Medicaid Other | Admitting: Physical Therapy

## 2020-12-08 ENCOUNTER — Telehealth: Payer: Self-pay | Admitting: Genetic Counselor

## 2020-12-08 NOTE — Telephone Encounter (Signed)
Received a genetic counseling referral for fhx of breast cancer. Ms. Cahn has been cld and scheduled to see Keith on 7/20 at 10am. Pt aware to arrive 15 minutes early.

## 2020-12-26 ENCOUNTER — Encounter: Payer: Self-pay | Admitting: *Deleted

## 2020-12-27 ENCOUNTER — Encounter: Payer: Self-pay | Admitting: Neurology

## 2020-12-27 ENCOUNTER — Ambulatory Visit: Payer: Medicaid Other | Admitting: Neurology

## 2020-12-27 DIAGNOSIS — M4802 Spinal stenosis, cervical region: Secondary | ICD-10-CM | POA: Diagnosis not present

## 2020-12-27 DIAGNOSIS — G43019 Migraine without aura, intractable, without status migrainosus: Secondary | ICD-10-CM

## 2020-12-27 HISTORY — DX: Spinal stenosis, cervical region: M48.02

## 2020-12-27 HISTORY — DX: Migraine without aura, intractable, without status migrainosus: G43.019

## 2020-12-27 MED ORDER — DICLOFENAC POTASSIUM 50 MG PO TABS: 50.0000 mg | ORAL_TABLET | Freq: Three times a day (TID) | ORAL | 3 refills | Status: DC | PRN

## 2020-12-27 NOTE — Progress Notes (Signed)
Reason for visit: Hand and arm discomfort, migraine headache  Referring physician: Dr. Clent Jacks is a 40 y.o. female  History of present illness:  Tina Russell is a 40 year old right-handed white female with a history of ADD and a history of significant anxiety issues.  The patient has a 3-year history of left shoulder and neck discomfort.  Within the last year she began having tingling in both hands, left greater than right and some discomfort at the wrists on both sides.  The patient has had some pain and discomfort with abduction of the left arm, she was felt to have some intrinsic left shoulder problems as well.  The patient has noted that when she flexes her head and turns her head to the right, she has increased pain in the left neck and shoulder.  She denies any shock sensations down the arms or legs.  She denies any numbness or tingling in the feet, she feels that the left arm is slightly weak, but otherwise no weakness is noted throughout the body.  At times, she may feel as if her feet are somewhat heavy.  She has noted some urinary frequency but she drinks a lot of water during the day, occasionally she may have some mild incontinence but no urgency.  She has had at least 1 fall recently, but she does not have a significant gait disorder otherwise.  She reports a history of migraine headaches that begin in the left occipital area and spread around the left eye associated with significant photophobia, nausea and vomiting.  These episodes may occur on average once a week and may be severe at times.  She has taken Imitrex in the past without much benefit.  She has never been on a daily preventative medication.  She will try to sleep or rest which helps the headache.  She has had nerve conduction studies done in April 2022, she was told that these were relatively unremarkable.  She was seen by Dr. Sherlean Foot from orthopedic surgery, MRI of the cervical spine was recently done and revealed  evidence of severe spinal stenosis at the C6-7 level with cord flattening but no cord increased signal.  She is sent here for further evaluation.  Past Medical History:  Diagnosis Date   ADHD    Anxiety    Chronic back pain    Depression    Hypertension    Kidney failure    Migraine    Neuropathy    PTSD (post-traumatic stress disorder)     Past Surgical History:  Procedure Laterality Date   DILATION AND CURETTAGE OF UTERUS     3 blood tranfusions    Family History  Problem Relation Age of Onset   Alcohol abuse Mother    Hypertension Mother    Hyperlipidemia Mother    Hypertension Father    Hyperlipidemia Father    Alcohol abuse Father    Alcohol abuse Brother    Hypertension Brother    Hypertension Maternal Grandmother    Breast cancer Maternal Grandmother    Breast cancer Paternal Grandfather    Hypertension Paternal Grandfather     Social history:  reports that she has been smoking e-cigarettes. She has never used smokeless tobacco. She reports previous alcohol use. She reports current drug use. Drug: Marijuana.  Medications:  Prior to Admission medications   Medication Sig Start Date End Date Taking? Authorizing Provider  Cholecalciferol (D3 PO) Take by mouth. Dose unknown   Yes [provider]  cloNIDine (CATAPRES) 0.1 MG tablet Take 0.1 mg by mouth daily. 11/15/20  Yes [provider]  Cyanocobalamin (B-12 PO) Take by mouth. Dose unknown   Yes [provider]  lamoTRIgine (LAMICTAL) 200 MG tablet Take 200 mg by mouth daily. 11/29/20  Yes [provider]  levonorgestrel (MIRENA) 20 MCG/24HR IUD by Intrauterine route.   Yes [provider]  QUEtiapine (SEROQUEL) 25 MG tablet Take 1-2 tablets by mouth nightly 07/25/20  Yes [provider]  atomoxetine (STRATTERA) 25 MG capsule Take 25 mg by mouth every morning. Patient not taking: Reported on 12/27/2020 08/23/20   [provider]      Allergies  Allergen  Reactions   Amoxicillin-Pot Clavulanate Rash    ROS:  Out of a complete 14 system review of symptoms, the patient complains only of the following symptoms, and all other reviewed systems are negative.  Hand pain, tingling Neck pain Headache Anxiety  Blood pressure (!) 140/93, pulse 65, height 5\' 4"  (1.626 m), weight 150 lb (68 kg).  Physical Exam  General: The patient is alert and cooperative at the time of the examination.  Eyes: Pupils are equal, round, and reactive to light. Discs are flat bilaterally.  Neck: The neck is supple, no carotid bruits are noted.  Respiratory: The respiratory examination is clear.  Cardiovascular: The cardiovascular examination reveals a regular rate and rhythm, no obvious murmurs or rubs are noted.  Neuromuscular: Range move the cervical spine lacks about 10 degrees of full lateral rotation to the left, full range of movement to the right.  Skin: Extremities are without significant edema.  Neurologic Exam  Mental status: The patient is alert and oriented x 3 at the time of the examination. The patient has apparent normal recent and remote memory, with an apparently normal attention span and concentration ability.  Cranial nerves: Facial symmetry is present. There is good sensation of the face to pinprick and soft touch bilaterally. The strength of the facial muscles and the muscles to head turning and shoulder shrug are normal bilaterally. Speech is well enunciated, no aphasia or dysarthria is noted. Extraocular movements are full. Visual fields are full. The tongue is midline, and the patient has symmetric elevation of the soft palate. No obvious hearing deficits are noted.  Motor: The motor testing reveals 5 over 5 strength of all 4 extremities. Good symmetric motor tone is noted throughout.  Sensory: Sensory testing is intact to pinprick, soft touch, vibration sensation, and position sense on all 4 extremities. No evidence of extinction is  noted.  Coordination: Cerebellar testing reveals good finger-nose-finger and heel-to-shin bilaterally.  Gait and station: Gait is normal. Tandem gait is normal. Romberg is negative. No drift is seen.  Reflexes: Deep tendon reflexes are symmetric and normal bilaterally. Toes are downgoing bilaterally.   MRI cervical 12/23/20:  IMPRESSION:  Cervical spondylosis, as outlined and with findings most notably as  follows.   At C6-C7, there is trace grade 1 retrolisthesis. Moderate to  moderately advanced disc degeneration with mild degenerative  endplate edema. A broad-based central/right center disc protrusion  with associated osteophyte ridge contributes to multifactorial  severe spinal canal stenosis with moderate spinal cord flattening.  Multifactorial bilateral neural foraminal narrowing (moderate/severe  right, moderate left).   At C5-C6, there is mild-to-moderate disc degeneration. Central  posterior annular fissure. Disc bulge. Mild bilateral uncovertebral  hypertrophy. Mild partial effacement of ventral thecal sac without  spinal cord mass effect. No significant foraminal stenosis.   No  significant disc herniation, spinal canal stenosis or neural  foraminal narrowing at the remaining levels.     Assessment/Plan:  1.  Cervical spinal stenosis, C6-7 level  2.  Common migraine headache  3.  Anxiety disorder  4.  ADHD  The patient has hand and arm symptoms that are likely related to the spinal stenosis.  She may also have some mild intrinsic disease involving the left shoulder.  She reports headaches that are likely migraine occurring on average once a week.  The patient will be given a prescription for diclofenac potassium to take as needed.  She will be seeing Dr. Sherlean Foot in the near future, she would likely need a referral for surgical decompression of the cervical spine.  She will follow-up here in 6 months.  In the future, the patient be followed by Dr. Lucia Gaskins.  Marlan Palau MD 12/27/2020 8:19 AM  [facility] [address] [place], [place] [address]  Phone [phone] Fax [phone]

## 2020-12-28 ENCOUNTER — Inpatient Hospital Stay: Payer: Medicaid Other

## 2020-12-28 ENCOUNTER — Encounter: Payer: Self-pay | Admitting: Genetic Counselor

## 2020-12-28 ENCOUNTER — Inpatient Hospital Stay: Payer: Medicaid Other | Attending: Genetic Counselor | Admitting: Genetic Counselor

## 2020-12-28 ENCOUNTER — Other Ambulatory Visit: Payer: Self-pay

## 2020-12-28 DIAGNOSIS — Z803 Family history of malignant neoplasm of breast: Secondary | ICD-10-CM

## 2020-12-28 DIAGNOSIS — Z808 Family history of malignant neoplasm of other organs or systems: Secondary | ICD-10-CM | POA: Insufficient documentation

## 2020-12-28 LAB — GENETIC SCREENING ORDER

## 2020-12-28 NOTE — Progress Notes (Signed)
REFERRING PROVIDER: Gustavus Bryant, PA-C [address],  [place] [address]  PRIMARY PROVIDER:  Department, [place] VISIT:  1. Family history of breast cancer   2. Family history of melanoma      HISTORY OF PRESENT ILLNESS:   Tina Russell, a 40 y.o. female, was seen for a [facility] cancer genetics consultation at the request of Tina Ser, PA-C due to a family history of cancer.  Tina Russell presents to clinic today to discuss the possibility of a hereditary predisposition to cancer, genetic testing, and to further clarify her future cancer risks, as well as potential cancer risks for family members.   Tina Russell is a 40 y.o. female with no personal history of cancer.    CANCER HISTORY:  Oncology History   No history exists.     RISK FACTORS:  Menarche was at age 83.  First live birth at age 58.  OCP use for approximately  14  years.  Ovaries intact: yes.  Hysterectomy: no.  Menopausal status: premenopausal.  HRT use: 0 years. Colonoscopy: yes; normal. Mammogram within the last year: yes. Number of breast biopsies: 1. Up to date with pelvic exams: yes. Any excessive radiation exposure in the past: no  Past Medical History:  Diagnosis Date   ADHD    Anxiety    Cervical spinal stenosis 12/27/2020   C6-7 level   Chronic back pain    Common migraine with intractable migraine 12/27/2020   Depression    Family history of breast cancer    Family history of melanoma    Hypertension    Kidney failure    Migraine    Neuropathy    PTSD (post-traumatic stress disorder)     Past Surgical History:  Procedure Laterality Date   DILATION AND CURETTAGE OF UTERUS     3 blood tranfusions    Social History   Socioeconomic History   Marital status: Single    Spouse name: Not on file   Number of children: 1   Years of education: Not on file   Highest education level: Bachelor's degree (e.g., BA, AB, BS)  Occupational History    Not on file  Tobacco Use   Smoking status: Every Day    Types: E-cigarettes   Smokeless tobacco: Never  Vaping Use   Vaping Use: Every day  Substance and Sexual Activity   Alcohol use: Not Currently   Drug use: Yes    Types: Marijuana    Comment: to help with pain   Sexual activity: Not on file  Other Topics Concern   Not on file  Social History Narrative   Lives with partner, son   Social Determinants of Health   Financial Resource Strain: Not on file  Food Insecurity: Not on file  Transportation Needs: Not on file  Physical Activity: Not on file  Stress: Not on file  Social Connections: Not on file     FAMILY HISTORY:  We obtained a detailed, 4-generation family history.  Significant diagnoses are listed below: Family History  Problem Relation Age of Onset   Alcohol abuse Mother    Hypertension Mother    Hyperlipidemia Mother    Skin cancer Mother    Hypertension Father    Hyperlipidemia Father    Alcohol abuse Father    Alcohol abuse Brother    Hypertension Brother    Breast cancer Maternal Aunt        Neg GT in 2017  Melanoma Maternal Aunt    Hypertension Maternal Grandmother    Breast cancer Maternal Grandmother    Throat cancer Paternal Grandmother    Hypertension Paternal Grandfather    Breast cancer Other        Mother's 3 maternal cousins   Breast cancer Other    Melanoma Other        mother's maternal female first cousin    The patient has one son who is cancer free.  She has a brother and sister who are both cancer free.  Both parents are living.  The patient's father has had colon polyps.  He had two brothers and a sister who are all cancer free.  His parents are deceased.  His mother had throat cancer.  The patient's mother had skin cancer.  She has two sisters and a brother.  One sister had breast cancer and reportedly had negative genetic testing in 2017.  This testing should be updated.  The other sister had melanoma.  The maternal  grandparents are deceased.  The grandfather was adopted.  The grandmother had breast cancer, as did her sister, Tina Russell and a brothers' two daughters.  [name] is aware of previous family history of genetic testing for hereditary cancer risks. Patient's maternal ancestors are of [place] descent, and paternal ancestors are of [place] descent. There is no reported Ashkenazi Jewish ancestry. There is no known consanguinity.  GENETIC COUNSELING ASSESSMENT: Tina Russell is a 40 y.o. female with a family history of breast cancer which is somewhat suggestive of a hereditary cancer syndrome and predisposition to cancer given the number of women in the family with breast cancer. We, therefore, discussed and recommended the following at today's visit.   DISCUSSION: We discussed that 5 - 10% of breast cancer is hereditary, with most cases associated with BRCA mutations.  There are other genes that can be associated with hereditary breast cancer syndromes.  These include ATM, CHEK2 and PALB2.  We discussed that testing is beneficial for several reasons including knowing how to follow individuals and understand if other family members could be at risk for cancer and allow them to undergo genetic testing.   We reviewed the characteristics, features and inheritance patterns of hereditary cancer syndromes. We also discussed genetic testing, including the appropriate family members to test, the process of testing, insurance coverage and turn-around-time for results. We discussed the implications of a negative, positive, carrier and/or variant of uncertain significant result. We recommended Tina Russell pursue genetic testing for the CancerNext-Expanded+RNAinsight gene panel. The CancerNext-Expanded gene panel offered by [facility] and includes sequencing and rearrangement analysis for the following 77 genes: AIP, ALK, APC*, ATM*, AXIN2, BAP1, BARD1, BLM, BMPR1A, BRCA1*, BRCA2*, BRIP1*, CDC73, CDH1*, CDK4, CDKN1B,  CDKN2A, CHEK2*, CTNNA1, DICER1, FANCC, FH, FLCN, GALNT12, KIF1B, LZTR1, MAX, MEN1, MET, MLH1*, MSH2*, MSH3, MSH6*, MUTYH*, NBN, NF1*, NF2, NTHL1, PALB2*, PHOX2B, PMS2*, POT1, PRKAR1A, PTCH1, PTEN*, RAD51C*, RAD51D*, RB1, RECQL, RET, SDHA, SDHAF2, SDHB, SDHC, SDHD, SMAD4, SMARCA4, SMARCB1, SMARCE1, STK11, SUFU, TMEM127, TP53*, TSC1, TSC2, VHL and XRCC2 (sequencing and deletion/duplication); EGFR, EGLN1, HOXB13, KIT, MITF, PDGFRA, POLD1, and POLE (sequencing only); EPCAM and GREM1 (deletion/duplication only). DNA and RNA analyses performed for * genes.   Based on Tina Russell family history of cancer, she meets medical criteria for genetic testing. Despite that she meets criteria, she may still have an out of pocket cost. We discussed that if her out of pocket cost for testing is over $100, the laboratory will call and confirm  whether she wants to proceed with testing.  If the out of pocket cost of testing is less than $100 she will be billed by the genetic testing laboratory.   PLAN: After considering the risks, benefits, and limitations, Tina Russell provided informed consent to pursue genetic testing and the blood sample was sent to [facility] for analysis of the CancerNext-Expanded+RNAinsight. Results should be available within approximately 2-3 weeks' time, at which point they will be disclosed by telephone to Tina Russell, as will any additional recommendations warranted by these results. Tina Russell will receive a summary of her genetic counseling visit and a copy of her results once available. This information will also be available in Epic.   Lastly, we encouraged Tina Russell to remain in contact with cancer genetics annually so that we can continuously update the family history and inform her of any changes in cancer genetics and testing that may be of benefit for this family.   Tina Russell questions were answered to her satisfaction today. Our contact information was provided should additional  questions or concerns arise. Thank you for the referral and allowing [place] to share in the care of your patient.   Angelea P. Florene Glen, [place], [facility] Licensed, Insurance risk surveyor [email or web address] phone: [phone]  The patient was seen for a total of 45 minutes in face-to-face genetic counseling.  The patient was seen alone.  This patient was discussed with Drs. Magrinat, Lindi Adie and/or Burr Medico who agrees with the above.    _______________________________________________________________________ For Office Staff:  Number of people involved in session: 1 Was an Intern/ student involved with case: no

## 2020-12-30 ENCOUNTER — Other Ambulatory Visit: Payer: Self-pay | Admitting: Neurology

## 2021-01-02 ENCOUNTER — Telehealth: Payer: Self-pay | Admitting: *Deleted

## 2021-01-02 ENCOUNTER — Ambulatory Visit: Payer: Medicaid Other | Admitting: Diagnostic Neuroimaging

## 2021-01-02 ENCOUNTER — Telehealth: Payer: Self-pay | Admitting: Neurology

## 2021-01-02 NOTE — Telephone Encounter (Signed)
Pt called, want to know Dr. Anne Hahn can do referral for  a neurosurgeon. Have a neurosurgeon would like to be referred to. Would like a call from the nurse.

## 2021-01-02 NOTE — Telephone Encounter (Signed)
Called patient who stated she wants referral to a spine surgeon in another group. She prefers referral be sent to [facility], Dr Delrae Alfred, spinal orthopedidic or send referral to Dr Barbaraann Barthel,  [facility], [place]. Her PCP doesn't feel comfortable referring her to neurosurgeon. Her pain is increasing, her ROM is decreasing daily. I informed her Dr Anne Hahn is out of office, I will send her request to Dr Pearlean Brownie, work in. Patient verbalized understanding, appreciation.

## 2021-01-02 NOTE — Telephone Encounter (Signed)
Diclofenac potassium PA faxed to [facility]. Patient failed ibuprofen, naproxen- 2 preferred drugs.

## 2021-01-02 NOTE — Telephone Encounter (Signed)
Medication has been approved through [employer] [facility]   Prior Approval #: [id number] Effective Begin Date: 01/02/2021 Effective End Date: 12/28/2021

## 2021-01-03 ENCOUNTER — Other Ambulatory Visit: Payer: Self-pay | Admitting: Neurology

## 2021-01-03 DIAGNOSIS — M4802 Spinal stenosis, cervical region: Secondary | ICD-10-CM

## 2021-01-03 NOTE — Telephone Encounter (Signed)
Pt called back, informed her previous telephone note. Patient verbalized understanding.

## 2021-01-03 NOTE — Telephone Encounter (Signed)
Called patient and phone was answered but no one spoke. Will call back later. If patient calls phone staff may advise her MD sent referral to Dr Mikal Plane.

## 2021-01-04 NOTE — Telephone Encounter (Signed)
Referral sent to [facility] for Dr. Franky Macho. They will call patient to schedule. Phone: [phone]. Marked as urgent.

## 2021-01-05 ENCOUNTER — Encounter: Payer: Self-pay | Admitting: *Deleted

## 2021-01-05 ENCOUNTER — Telehealth: Payer: Self-pay | Admitting: *Deleted

## 2021-01-05 NOTE — Telephone Encounter (Signed)
Patient called, stated she picked up diclofenac  9 tabs on 01/01/21 because PA hadn't been done. Med was approved. She stated pharmacy now tells her they can't fill; insurance is rejecting it. She advised them not to send in as refill, but stated they "are not listening" to her. I advised will call pharmacy, let her know. Patient verbalized understanding, appreciation. Called [facility], spoke with Latoya who ran rx through with note : remaining of partial fill. Insurance still denied. She will investigate and let patient know. I gave her PA # and approval dates. She verbalized understanding, appreciation. Sent my chart to update patient.

## 2021-01-10 ENCOUNTER — Telehealth: Payer: Self-pay | Admitting: Genetic Counselor

## 2021-01-10 ENCOUNTER — Encounter: Payer: Self-pay | Admitting: Genetic Counselor

## 2021-01-10 ENCOUNTER — Ambulatory Visit: Payer: Self-pay | Admitting: Genetic Counselor

## 2021-01-10 DIAGNOSIS — Z1379 Encounter for other screening for genetic and chromosomal anomalies: Secondary | ICD-10-CM

## 2021-01-10 NOTE — Telephone Encounter (Signed)
Revealed negative genetic testing.  Discussed that we do not know why there is cancer in the family. It could be due to a different gene that we are not testing, or maybe our current technology may not be able to pick something up.  It will be important for her to keep in contact with genetics to keep up with whether additional testing may be needed.  One VUS was identified but this will not change medical management.

## 2021-01-10 NOTE — Progress Notes (Signed)
HPI:  Tina Russell was previously seen in the [place] clinic due to a family history of breast cancer and concerns regarding a hereditary predisposition to cancer. Please refer to our prior cancer genetics clinic note for more information regarding our discussion, assessment and recommendations, at the time. Tina Russell recent genetic test results were disclosed to her, as were recommendations warranted by these results. These results and recommendations are discussed in more detail below.  CANCER HISTORY:  Oncology History   No history exists.    FAMILY HISTORY:  We obtained a detailed, 4-generation family history.  Significant diagnoses are listed below: Family History  Problem Relation Age of Onset   Alcohol abuse Mother    Hypertension Mother    Hyperlipidemia Mother    Skin cancer Mother    Hypertension Father    Hyperlipidemia Father    Alcohol abuse Father    Alcohol abuse Brother    Hypertension Brother    Breast cancer Maternal Aunt        Neg GT in 2017   Melanoma Maternal Aunt    Hypertension Maternal Grandmother    Breast cancer Maternal Grandmother    Throat cancer Paternal Grandmother    Hypertension Paternal Grandfather    Breast cancer Other        Mother's 3 maternal cousins   Breast cancer Other    Melanoma Other        mother's maternal female first cousin    The patient has one son who is cancer free.  She has a brother and sister who are both cancer free.  Both parents are living.   The patient's father has had colon polyps.  He had two brothers and a sister who are all cancer free.  His parents are deceased.  His mother had throat cancer.   The patient's mother had skin cancer.  She has two sisters and a brother.  One sister had breast cancer and reportedly had negative genetic testing in 2017.  This testing should be updated.  The other sister had melanoma.  The maternal grandparents are deceased.  The grandfather was adopted.  The grandmother  had breast cancer, as did her sister, Leane Para daughter and a brothers' two daughters.   Tina Russell is aware of previous family history of genetic testing for hereditary cancer risks. Patient's maternal ancestors are of [place] descent, and paternal ancestors are of [place] descent. There is no reported Ashkenazi Jewish ancestry. There is no known consanguinity.  GENETIC TEST RESULTS: Genetic testing reported out on January 09, 2021 through the CancerNext-Expanded+RNAinsight cancer panel found no pathogenic mutations. The CancerNext-Expanded gene panel offered by [facility] and includes sequencing and rearrangement analysis for the following 77 genes: AIP, ALK, APC*, ATM*, AXIN2, BAP1, BARD1, BLM, BMPR1A, BRCA1*, BRCA2*, BRIP1*, CDC73, CDH1*, CDK4, CDKN1B, CDKN2A, CHEK2*, CTNNA1, DICER1, FANCC, FH, FLCN, GALNT12, KIF1B, LZTR1, MAX, MEN1, MET, MLH1*, MSH2*, MSH3, MSH6*, MUTYH*, NBN, NF1*, NF2, NTHL1, PALB2*, PHOX2B, PMS2*, POT1, PRKAR1A, PTCH1, PTEN*, RAD51C*, RAD51D*, RB1, RECQL, RET, SDHA, SDHAF2, SDHB, SDHC, SDHD, SMAD4, SMARCA4, SMARCB1, SMARCE1, STK11, SUFU, TMEM127, TP53*, TSC1, TSC2, VHL and XRCC2 (sequencing and deletion/duplication); EGFR, EGLN1, HOXB13, KIT, MITF, PDGFRA, POLD1, and POLE (sequencing only); EPCAM and GREM1 (deletion/duplication only). DNA and RNA analyses performed for * genes. The test report has been scanned into EPIC and is located under the Molecular Pathology section of the Results Review tab.  A portion of the result report is included below for reference.  We discussed with Tina Russell that because current genetic testing is not perfect, it is possible there may be a gene mutation in one of these genes that current testing cannot detect, but that chance is small.  We also discussed, that there could be another gene that has not yet been discovered, or that we have not yet tested, that is responsible for the cancer diagnoses in the family. It is also possible there is a  hereditary cause for the cancer in the family that Tina Russell did not inherit and therefore was not identified in her testing.  Therefore, it is important to remain in touch with cancer genetics in the future so that we can continue to offer Ms. Verdun the most up to date genetic testing.   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  At this time, it is unknown if this variant is associated with increased cancer risk or if this is a normal finding, but most variants such as this get reclassified to being inconsequential. It should not be used to make medical management decisions. With time, we suspect the lab will determine the significance of this variant, if any. If we do learn more about it, we will try to contact Tina Russell to discuss it further. However, it is important to stay in touch with [place] periodically and keep the address and phone number up to date.  ADDITIONAL GENETIC TESTING: We discussed with Tina Russell that her genetic testing was fairly extensive.  If there are genes identified to increase cancer risk that can be analyzed in the future, we would be happy to discuss and coordinate this testing at that time.    CANCER SCREENING RECOMMENDATIONS: Tina Russell test result is considered negative (normal).  This means that we have not identified a hereditary cause for her family history of breast at this time. Most cancers happen by chance and this negative test suggests that her cancer may fall into this category.    While reassuring, this does not definitively rule out a hereditary predisposition to cancer. It is still possible that there could be genetic mutations that are undetectable by current technology. There could be genetic mutations in genes that have not been tested or identified to increase cancer risk.  Therefore, it is recommended she continue to follow the cancer management and screening guidelines provided by her  primary healthcare provider.   An individual's cancer risk and medical management are not determined by genetic test results alone. Overall cancer risk assessment incorporates additional factors, including personal medical history, family history, and any available genetic information that may result in a personalized plan for cancer prevention and surveillance  Based on Ms. Ehle family of cancer, as well as her genetic test results, statistical models Harriett Rush)  and literature data were used to estimate her risk of developing breast cancer. These estimate her lifetime risk of developing breast cancer to be approximately 26.7%.  The patient's lifetime breast cancer risk is a preliminary estimate based on available information using one of several models endorsed by the [place] (ACS). The ACS recommends consideration of breast MRI screening as an adjunct to mammography for patients at high risk (defined as 20% or greater lifetime risk). A more detailed breast cancer risk assessment can be considered, if clinically indicated.   Ms. Sedano has been determined to be at high risk for breast cancer.  Therefore, we recommend that annual screening with  mammography and breast MRI begin at age 56, or 10 years prior to the age of breast cancer diagnosis in a relative (whichever is earlier).  We discussed that it is reasonable for Ms. Eschbach to be followed by a high-risk breast cancer clinic; in addition to a yearly mammogram and physical exam by a healthcare provider, she should discuss the usefulness of an annual breast MRI with the high-risk clinic providers.  She has been referred to the [facility] High Risk breast clinic.  RECOMMENDATIONS FOR FAMILY MEMBERS:  Individuals in this family might be at some increased risk of developing cancer, over the general population risk, simply due to the family history of cancer.  We recommended women in this family have a yearly mammogram beginning at age 28, or 61  years younger than the earliest onset of cancer, an annual clinical breast exam, and perform monthly breast self-exams. Women in this family should also have a gynecological exam as recommended by their primary provider. All family members should be referred for colonoscopy starting at age 46.  FOLLOW-UP: Lastly, we discussed with Ms. Arata that cancer genetics is a rapidly advancing field and it is possible that new genetic tests will be appropriate for her and/or her family members in the future. We encouraged her to remain in contact with cancer genetics on an annual basis so we can update her personal and family histories and let her know of advances in cancer genetics that may benefit this family.   Our contact number was provided. Ms. Hartl questions were answered to her satisfaction, and she knows she is welcome to call us at anytime with additional questions or concerns.   Roma Kayser, [place], [facility] Licensed, Certified Genetic Counselor [email or web address]

## 2021-01-11 ENCOUNTER — Telehealth: Payer: Self-pay | Admitting: Hematology and Oncology

## 2021-01-11 NOTE — Telephone Encounter (Signed)
Received a high risk referral from [facility]. Ms. Cuthrell has been cld and scheduled to see Dr. Al Pimple on 8/22 at 10:40am.

## 2021-01-17 ENCOUNTER — Other Ambulatory Visit: Payer: Self-pay | Admitting: Neurosurgery

## 2021-01-20 ENCOUNTER — Telehealth: Payer: Self-pay | Admitting: Neurology

## 2021-01-20 NOTE — Telephone Encounter (Signed)
Pt called stating the pharmacy gave her diclofenac (CATAFLAM) 50 MG tablet sodium. She is wanting to know did Dr. Anne Hahn change the prescription. Pt is going into surgery on next week. Pt is requesting a call back.

## 2021-01-23 NOTE — Telephone Encounter (Signed)
Called patient to inform her they are same medication, but no answer, call cannot be completed. If patient calls back phone staff may relay message to her. Dr Anne Hahn did not change prescription, they are the same.

## 2021-01-24 ENCOUNTER — Other Ambulatory Visit: Payer: Self-pay

## 2021-01-24 ENCOUNTER — Encounter (HOSPITAL_COMMUNITY)
Admission: RE | Admit: 2021-01-24 | Discharge: 2021-01-24 | Disposition: A | Discharge status: Home / Self Care | Payer: Medicaid Other | Source: Ambulatory Visit | Attending: Neurosurgery | Admitting: Neurosurgery

## 2021-01-24 ENCOUNTER — Encounter (HOSPITAL_COMMUNITY): Payer: Self-pay

## 2021-01-24 DIAGNOSIS — Z01812 Encounter for preprocedural laboratory examination: Secondary | ICD-10-CM | POA: Diagnosis not present

## 2021-01-24 HISTORY — DX: Bipolar disorder, unspecified: F31.9

## 2021-01-24 LAB — BASIC METABOLIC PANEL
Anion gap: 8 (ref 5–15)
BUN: 12 mg/dL (ref 6–20)
CO2: 24 mmol/L (ref 22–32)
Calcium: 8.9 mg/dL (ref 8.9–10.3)
Chloride: 104 mmol/L (ref 98–111)
Creatinine, Ser: 0.73 mg/dL (ref 0.44–1.00)
GFR, Estimated: >60 mL/min (ref >60)
Glucose, Bld: 82 mg/dL (ref 70–99)
Potassium: 4.6 mmol/L (ref 3.5–5.1)
Sodium: 136 mmol/L (ref 135–145)

## 2021-01-24 LAB — CBC
HCT: 38.5 % (ref 36.0–46.0)
Hemoglobin: 12.7 g/dL (ref 12.0–15.0)
MCH: 31.2 pg (ref 26.0–34.0)
MCHC: 33 g/dL (ref 30.0–36.0)
MCV: 94.6 fL (ref 80.0–100.0)
Platelets: 195 10*3/uL (ref 150–400)
RBC: 4.07 MIL/uL (ref 3.87–5.11)
RDW: 12.7 % (ref 11.5–15.5)
WBC: 7 10*3/uL (ref 4.0–10.5)
nRBC: 0 % (ref 0.0–0.2)

## 2021-01-24 LAB — SURGICAL PCR SCREEN
MRSA, PCR: NEGATIVE
Staphylococcus aureus: NEGATIVE

## 2021-01-24 NOTE — Progress Notes (Signed)
PCP - [facility] Department Cardiologist - none  PPM/ICD - denies Device Orders -  Rep Notified -   Chest x-ray - 10/14/20 EKG - 08/30/20 Stress Test -08/14/19 ECHO - 08/14/19 Cardiac Cath - none  Sleep Study -none  CPAP - no  Fasting Blood Sugar - n/a Checks Blood Sugar _____ times a day  Blood Thinner Instructions:n/a Aspirin Instructions:  ERAS Protcol -no PRE-SURGERY Ensure or G2-   COVID TEST- Testing deferred because patient reported that she had a positive home test in July but did not go to a doctor. She reported that she did have to cancel doctor's appointments because of her Covid infection. After speaking with Rojelio Brenner have requested information from [facility] Department and Dr. Viviann Spare Lucey's office. There is documentation of patient having Covid in telemedicine note of 01/10/2021.   Anesthesia review: no  Patient denies shortness of breath, fever, cough and chest pain at PAT appointment   All instructions explained to the patient, with a verbal understanding of the material. Patient agrees to go over the instructions while at home for a better understanding. Patient also instructed to self quarantine after being tested for COVID-19. The opportunity to ask questions was provided.

## 2021-01-24 NOTE — Progress Notes (Signed)
Surgical Instructions    Your procedure is scheduled on Friday, August 19th, 2022.   Report to [facility] Main Entrance "A" at 12:50 A.M., then check in with the Admitting office.  Call this number if you have problems the morning of surgery:  [phone]   If you have any questions prior to your surgery date call [phone]: Open Monday-Friday 8am-4pm    Remember:  Do not eat or drink after midnight the night before your surgery   Take these medicines the morning of surgery with A SIP OF WATER:  cloNIDine (CATAPRES)    As of today, STOP taking any Aspirin (unless otherwise instructed by your surgeon) Aleve, Naproxen, Ibuprofen, Motrin, Advil, Goody's, BC's, all herbal medications, fish oil, and all vitamins.          Do not wear jewelry or makeup Do not wear lotions, powders, perfumes, or deodorant. Do not shave 48 hours prior to surgery.   Do not bring valuables to the hospital. DO Not wear nail polish, gel polish, artificial nails, or any other type of covering on natural nails including finger and toenails. If patients have artificial nails, gel coating, etc. that need to be removed by a nail salon please have this removed prior to surgery or surgery may need to be canceled/delayed if the surgeon/ anesthesia feels like the patient is unable to be adequately monitored.             [facility] is not responsible for any belongings or valuables.  Do NOT Smoke (Tobacco/Vaping) or drink Alcohol 24 hours prior to your procedure If you use a CPAP at night, you may bring all equipment for your overnight stay.   Contacts, glasses, dentures or bridgework may not be worn into surgery, please bring cases for these belongings   For patients admitted to the hospital, discharge time will be determined by your treatment team.   Patients discharged the day of surgery will not be allowed to drive home, and someone needs to stay with them for 24 hours.  ONLY 1 SUPPORT PERSON MAY BE  PRESENT WHILE YOU ARE IN SURGERY. IF YOU ARE TO BE ADMITTED ONCE YOU ARE IN YOUR ROOM YOU WILL BE ALLOWED TWO (2) VISITORS.  Minor children may have two parents present. Special consideration for safety and communication needs will be reviewed on a case by case basis.  Special instructions:    Oral Hygiene is also important to reduce your risk of infection.  Remember - BRUSH YOUR TEETH THE MORNING OF SURGERY WITH YOUR REGULAR TOOTHPASTE   [place]- Preparing For Surgery  Before surgery, you can play an important role. Because skin is not sterile, your skin needs to be as free of germs as possible. You can reduce the number of germs on your skin by washing with CHG (chlorahexidine gluconate) Soap before surgery.  CHG is an antiseptic cleaner which kills germs and bonds with the skin to continue killing germs even after washing.     Please do not use if you have an allergy to CHG or antibacterial soaps. If your skin becomes reddened/irritated stop using the CHG.  Do not shave (including legs and underarms) for at least 48 hours prior to first CHG shower. It is OK to shave your face.  Please follow these instructions carefully.     Shower the NIGHT BEFORE SURGERY and the MORNING OF SURGERY with CHG Soap.   If you chose to wash your hair, wash your hair first as usual with  your normal shampoo. After you shampoo, rinse your hair and body thoroughly to remove the shampoo.  Then [employer] and genitals (private parts) with your normal soap and rinse thoroughly to remove soap.  After that Use CHG Soap as you would any other liquid soap. You can apply CHG directly to the skin and wash gently with a scrungie or a clean washcloth.   Apply the CHG Soap to your body ONLY FROM THE NECK DOWN.  Do not use on open wounds or open sores. Avoid contact with your eyes, ears, mouth and genitals (private parts). Wash Face and genitals (private parts)  with your normal soap.   Wash thoroughly, paying special  attention to the area where your surgery will be performed.  Thoroughly rinse your body with warm water from the neck down.  DO NOT shower/wash with your normal soap after using and rinsing off the CHG Soap.  Pat yourself dry with a CLEAN TOWEL.  Wear CLEAN PAJAMAS to bed the night before surgery  Place CLEAN SHEETS on your bed the night before your surgery  DO NOT SLEEP WITH PETS.   Day of Surgery:  Take a shower with CHG soap. Wear Clean/Comfortable clothing the morning of surgery Do not apply any deodorants/lotions.   Remember to brush your teeth WITH YOUR REGULAR TOOTHPASTE.   Please read over the following fact sheets that you were given.

## 2021-01-27 ENCOUNTER — Other Ambulatory Visit: Payer: Self-pay | Admitting: Neurosurgery

## 2021-01-27 ENCOUNTER — Encounter (HOSPITAL_COMMUNITY): Admission: RE | Payer: Self-pay | Source: Ambulatory Visit

## 2021-01-27 ENCOUNTER — Ambulatory Visit (HOSPITAL_COMMUNITY): Admission: RE | Admit: 2021-01-27 | Payer: Medicaid Other | Source: Ambulatory Visit | Admitting: Neurosurgery

## 2021-01-27 ENCOUNTER — Encounter (HOSPITAL_COMMUNITY): Payer: Self-pay | Admitting: Emergency Medicine

## 2021-01-27 ENCOUNTER — Telehealth: Payer: Self-pay | Admitting: Hematology and Oncology

## 2021-01-27 ENCOUNTER — Observation Stay (HOSPITAL_COMMUNITY)
Admission: EM | Admit: 2021-01-27 | Discharge: 2021-01-30 | Disposition: A | Discharge status: Home / Self Care | Payer: Medicaid Other | Attending: Emergency Medicine | Admitting: Emergency Medicine

## 2021-01-27 DIAGNOSIS — N179 Acute kidney failure, unspecified: Secondary | ICD-10-CM | POA: Diagnosis not present

## 2021-01-27 DIAGNOSIS — I10 Essential (primary) hypertension: Secondary | ICD-10-CM | POA: Diagnosis not present

## 2021-01-27 DIAGNOSIS — Z79899 Other long term (current) drug therapy: Secondary | ICD-10-CM | POA: Diagnosis not present

## 2021-01-27 DIAGNOSIS — F17290 Nicotine dependence, other tobacco product, uncomplicated: Secondary | ICD-10-CM | POA: Insufficient documentation

## 2021-01-27 DIAGNOSIS — Z20822 Contact with and (suspected) exposure to covid-19: Secondary | ICD-10-CM | POA: Diagnosis not present

## 2021-01-27 DIAGNOSIS — Z419 Encounter for procedure for purposes other than remedying health state, unspecified: Secondary | ICD-10-CM

## 2021-01-27 DIAGNOSIS — M50323 Other cervical disc degeneration at C6-C7 level: Principal | ICD-10-CM | POA: Insufficient documentation

## 2021-01-27 DIAGNOSIS — M4802 Spinal stenosis, cervical region: Secondary | ICD-10-CM | POA: Insufficient documentation

## 2021-01-27 DIAGNOSIS — M542 Cervicalgia: Secondary | ICD-10-CM

## 2021-01-27 DIAGNOSIS — M4722 Other spondylosis with radiculopathy, cervical region: Secondary | ICD-10-CM | POA: Diagnosis present

## 2021-01-27 LAB — CBC WITH DIFFERENTIAL/PLATELET
Abs Immature Granulocytes: 0.04 10*3/uL (ref 0.00–0.07)
Basophils Absolute: 0 10*3/uL (ref 0.0–0.1)
Basophils Relative: 0 %
Eosinophils Absolute: 0 10*3/uL (ref 0.0–0.5)
Eosinophils Relative: 0 %
HCT: 40.1 % (ref 36.0–46.0)
Hemoglobin: 13.4 g/dL (ref 12.0–15.0)
Immature Granulocytes: 1 %
Lymphocytes Relative: 11 %
Lymphs Abs: 0.8 10*3/uL (ref 0.7–4.0)
MCH: 31.1 pg (ref 26.0–34.0)
MCHC: 33.4 g/dL (ref 30.0–36.0)
MCV: 93 fL (ref 80.0–100.0)
Monocytes Absolute: 0.1 10*3/uL (ref 0.1–1.0)
Monocytes Relative: 2 %
Neutro Abs: 6.5 10*3/uL (ref 1.7–7.7)
Neutrophils Relative %: 86 %
Platelets: 217 10*3/uL (ref 150–400)
RBC: 4.31 MIL/uL (ref 3.87–5.11)
RDW: 12.6 % (ref 11.5–15.5)
WBC: 7.5 10*3/uL (ref 4.0–10.5)
nRBC: 0 % (ref 0.0–0.2)

## 2021-01-27 LAB — CBC
HCT: 38.2 % (ref 36.0–46.0)
Hemoglobin: 12.4 g/dL (ref 12.0–15.0)
MCH: 30.6 pg (ref 26.0–34.0)
MCHC: 32.5 g/dL (ref 30.0–36.0)
MCV: 94.3 fL (ref 80.0–100.0)
Platelets: 208 10*3/uL (ref 150–400)
RBC: 4.05 MIL/uL (ref 3.87–5.11)
RDW: 12.6 % (ref 11.5–15.5)
WBC: 7.5 10*3/uL (ref 4.0–10.5)
nRBC: 0 % (ref 0.0–0.2)

## 2021-01-27 LAB — URINALYSIS, ROUTINE W REFLEX MICROSCOPIC
Bilirubin Urine: NEGATIVE
Glucose, UA: NEGATIVE mg/dL
Hgb urine dipstick: NEGATIVE
Ketones, ur: 5 mg/dL — AB
Leukocytes,Ua: NEGATIVE
Nitrite: NEGATIVE
Protein, ur: NEGATIVE mg/dL
Specific Gravity, Urine: 1.012 (ref 1.005–1.030)
pH: 6 (ref 5.0–8.0)

## 2021-01-27 LAB — BASIC METABOLIC PANEL
Anion gap: 7 (ref 5–15)
BUN: 9 mg/dL (ref 6–20)
CO2: 25 mmol/L (ref 22–32)
Calcium: 8.7 mg/dL — ABNORMAL LOW (ref 8.9–10.3)
Chloride: 105 mmol/L (ref 98–111)
Creatinine, Ser: 0.81 mg/dL (ref 0.44–1.00)
GFR, Estimated: >60 mL/min (ref >60)
Glucose, Bld: 93 mg/dL (ref 70–99)
Potassium: 4.2 mmol/L (ref 3.5–5.1)
Sodium: 137 mmol/L (ref 135–145)

## 2021-01-27 LAB — APTT: aPTT: 27 seconds (ref 24–36)

## 2021-01-27 LAB — RESP PANEL BY RT-PCR (FLU A&B, COVID) ARPGX2
Influenza A by PCR: NEGATIVE
Influenza B by PCR: NEGATIVE
SARS Coronavirus 2 by RT PCR: NEGATIVE

## 2021-01-27 LAB — HIV ANTIBODY (ROUTINE TESTING W REFLEX): HIV Screen 4th Generation wRfx: NONREACTIVE

## 2021-01-27 LAB — PROTIME-INR
INR: 1 (ref 0.8–1.2)
Prothrombin Time: 13 seconds (ref 11.4–15.2)

## 2021-01-27 SURGERY — ANTERIOR CERVICAL DECOMPRESSION/DISCECTOMY FUSION 1 LEVEL
Anesthesia: General

## 2021-01-27 MED ORDER — LAMOTRIGINE 100 MG PO TABS
200.0000 mg | ORAL_TABLET | Freq: Every day | ORAL | Status: DC
  Administered 2021-01-27 – 2021-01-29 (×3): 200 mg via ORAL
  Filled 2021-01-27 (×3): qty 2

## 2021-01-27 MED ORDER — LORAZEPAM 2 MG/ML IJ SOLN
0.5000 mg | Freq: Once | INTRAMUSCULAR | Status: AC
  Administered 2021-01-27: 0.5 mg via INTRAVENOUS
  Filled 2021-01-27: qty 1

## 2021-01-27 MED ORDER — LIDOCAINE 5 % EX PTCH
1.0000 | MEDICATED_PATCH | CUTANEOUS | Status: DC
  Administered 2021-01-27 – 2021-01-30 (×3): 1 via TRANSDERMAL
  Filled 2021-01-27 (×3): qty 1

## 2021-01-27 MED ORDER — DIAZEPAM 5 MG PO TABS
5.0000 mg | ORAL_TABLET | Freq: Four times a day (QID) | ORAL | Status: DC | PRN
  Administered 2021-01-27 – 2021-01-30 (×7): 5 mg via ORAL
  Filled 2021-01-27 (×7): qty 1

## 2021-01-27 MED ORDER — QUETIAPINE FUMARATE 25 MG PO TABS
75.0000 mg | ORAL_TABLET | Freq: Every day | ORAL | Status: DC
  Administered 2021-01-27 – 2021-01-29 (×3): 100 mg via ORAL
  Filled 2021-01-27 (×4): qty 4

## 2021-01-27 MED ORDER — ACETAMINOPHEN 500 MG PO TABS
1000.0000 mg | ORAL_TABLET | Freq: Once | ORAL | Status: AC
  Administered 2021-01-27: 1000 mg via ORAL
  Filled 2021-01-27: qty 2

## 2021-01-27 MED ORDER — DICLOFENAC POTASSIUM 50 MG PO TABS: 50.0000 mg | ORAL_TABLET | Freq: Three times a day (TID) | ORAL | Status: DC | PRN

## 2021-01-27 MED ORDER — POTASSIUM CHLORIDE IN NACL 20-0.9 MEQ/L-% IV SOLN
INTRAVENOUS | Status: DC
  Filled 2021-01-27 (×3): qty 1000

## 2021-01-27 MED ORDER — HYDROMORPHONE HCL 1 MG/ML IJ SOLN
0.5000 mg | INTRAMUSCULAR | Status: DC | PRN
  Administered 2021-01-28 – 2021-01-29 (×6): 1 mg via INTRAVENOUS
  Filled 2021-01-27 (×6): qty 1

## 2021-01-27 MED ORDER — DEXAMETHASONE SODIUM PHOSPHATE 10 MG/ML IJ SOLN
10.0000 mg | Freq: Once | INTRAMUSCULAR | Status: AC
  Administered 2021-01-27: 10 mg via INTRAVENOUS
  Filled 2021-01-27: qty 1

## 2021-01-27 MED ORDER — OXYCODONE HCL 5 MG PO TABS
5.0000 mg | ORAL_TABLET | ORAL | Status: DC | PRN
  Administered 2021-01-27 – 2021-01-29 (×5): 5 mg via ORAL
  Filled 2021-01-27 (×5): qty 1

## 2021-01-27 MED ORDER — DEXAMETHASONE SODIUM PHOSPHATE 10 MG/ML IJ SOLN
INTRAMUSCULAR | Status: AC
  Filled 2021-01-27: qty 1

## 2021-01-27 MED ORDER — DICLOFENAC SODIUM 50 MG PO TBEC
50.0000 mg | DELAYED_RELEASE_TABLET | Freq: Three times a day (TID) | ORAL | Status: DC | PRN
  Filled 2021-01-27: qty 1

## 2021-01-27 MED ORDER — ACETAMINOPHEN 650 MG RE SUPP: 650.0000 mg | Freq: Four times a day (QID) | RECTAL | Status: DC | PRN

## 2021-01-27 MED ORDER — HYDROMORPHONE HCL 1 MG/ML IJ SOLN
1.0000 mg | Freq: Once | INTRAMUSCULAR | Status: AC
Start: 2021-01-27 — End: 2021-01-27
  Administered 2021-01-27: 1 mg via INTRAVENOUS
  Filled 2021-01-27: qty 1

## 2021-01-27 MED ORDER — ONDANSETRON HCL 4 MG/2ML IJ SOLN
4.0000 mg | Freq: Four times a day (QID) | INTRAMUSCULAR | Status: DC | PRN
  Administered 2021-01-28: 4 mg via INTRAVENOUS
  Filled 2021-01-27: qty 2

## 2021-01-27 MED ORDER — ONDANSETRON HCL 4 MG PO TABS: 4.0000 mg | ORAL_TABLET | Freq: Four times a day (QID) | ORAL | Status: DC | PRN

## 2021-01-27 MED ORDER — CLONIDINE HCL 0.1 MG PO TABS
0.1000 mg | ORAL_TABLET | Freq: Two times a day (BID) | ORAL | Status: DC
  Administered 2021-01-27 – 2021-01-30 (×5): 0.1 mg via ORAL
  Filled 2021-01-27 (×6): qty 1

## 2021-01-27 MED ORDER — ACETAMINOPHEN 325 MG PO TABS: 650.0000 mg | ORAL_TABLET | Freq: Four times a day (QID) | ORAL | Status: DC | PRN

## 2021-01-27 NOTE — ED Provider Notes (Signed)
Emergency Medicine Provider Triage Evaluation Note  Tina Russell , a 40 y.o. female  was evaluated in triage.  Pt complains of neck pain.  History of similar, has been going on for 3 years.  Was supposed to have surgery Dr. Franky Macho today for a bulging disc, however due to insurance issues this was canceled.  She has not followed up with his office.  She states she is here because her pain is severe and she cannot go another week with this pain.  She takes diclofenac, does not take any other medications.  Review of Systems  Positive: Neck pain Negative: New numbness  Physical Exam  BP (!) 156/96 (BP Location: Right Arm)   Pulse 75   Temp 98.8 F (37.1 C) (Oral)   Resp 14   SpO2 100%  Gen:   Awake, no distress   Resp:  Normal effort  MSK:   Moves extremities without difficulty  Other:  Grip strength equal bilaterally  Medical Decision Making  Medically screening exam initiated at 11:26 AM.  Appropriate orders placed.  Tina Russell was informed that the remainder of the evaluation will be completed by another provider, this initial triage assessment does not replace that evaluation, and the importance of remaining in the ED until their evaluation is complete.     Alveria Apley, PA-C 01/27/21 1127    Cheryll Cockayne, MD 01/27/21 1443

## 2021-01-27 NOTE — ED Notes (Signed)
Pt denies numbness and tingling. Pt denies loss of bowel or bladder.   

## 2021-01-27 NOTE — ED Provider Notes (Signed)
Dr. Franky Macho to see as pt's surgery was cancelled today.  He'll see her and will determine plan.    3:45 PM  Neurosurgeon Dr. Franky Macho has seen and evaluated patient and will admit patient for surgical intervention tomorrow.  BP (!) 153/106   Pulse 62   Temp 98.8 F (37.1 C) (Oral)   Resp 15   SpO2 100%   Results for orders placed or performed during the hospital encounter of 01/27/21  Resp Panel by RT-PCR (Flu A&B, Covid) Nasopharyngeal Swab   Specimen: Nasopharyngeal Swab; Nasopharyngeal(NP) swabs in vial transport medium  Result Value Ref Range   SARS Coronavirus 2 by RT PCR NEGATIVE NEGATIVE   Influenza A by PCR NEGATIVE NEGATIVE   Influenza B by PCR NEGATIVE NEGATIVE  CBC  Result Value Ref Range   WBC 7.5 4.0 - 10.5 K/uL   RBC 4.05 3.87 - 5.11 MIL/uL   Hemoglobin 12.4 12.0 - 15.0 g/dL   HCT 35.4 65.6 - 81.2 %   MCV 94.3 80.0 - 100.0 fL   MCH 30.6 26.0 - 34.0 pg   MCHC 32.5 30.0 - 36.0 g/dL   RDW 75.1 70.0 - 17.4 %   Platelets 208 150 - 400 K/uL   nRBC 0.0 0.0 - 0.2 %  Basic metabolic panel  Result Value Ref Range   Sodium 137 135 - 145 mmol/L   Potassium 4.2 3.5 - 5.1 mmol/L   Chloride 105 98 - 111 mmol/L   CO2 25 22 - 32 mmol/L   Glucose, Bld 93 70 - 99 mg/dL   BUN 9 6 - 20 mg/dL   Creatinine, Ser 9.44 0.44 - 1.00 mg/dL   Calcium 8.7 (L) 8.9 - 10.3 mg/dL   GFR, Estimated >96 >75 mL/min   Anion gap 7 5 - 15   No results found.     Fayrene Helper, PA-C 01/27/21 1545    Franne Forts, DO 01/28/21 [phone]

## 2021-01-27 NOTE — Progress Notes (Signed)
Pt admitted from ED with c/o of LUE pain, scheduled for surgery in the morning, pt alert and oriented, c/o of pain with a rating of 6,settled in bed  with call light at bedside, safety measures discussed and initiated, pt was however reassured and will continue to monitor, v/s stable. Obasogie-Asidi, Kemal Amores Efe

## 2021-01-27 NOTE — ED Notes (Signed)
Per lab, pt needs type and screen prior to surgery. Order placed per protocol

## 2021-01-27 NOTE — Telephone Encounter (Signed)
Pt cld to reschedule her appt w/Dr. Al Pimple to 10/3 at 10am.

## 2021-01-27 NOTE — ED Triage Notes (Signed)
Patient here for treatment of chronic back pain related to a bulging disc, was scheduled to have surgery today but was told "insurance was not ready" and surgery is delayed.

## 2021-01-27 NOTE — H&P (Signed)
Tina Russell is an 40 y.o. female.   Chief Complaint: left upper extremity pain HPI: Tina Russell has complained of pain in the left upper extremity since at least April, 2022. Work up revealed cervical stenosis at C6/7 with fairly severe degenerative changes and foraminal stenosis bilaterally. We had originally planned to do the case, and cancelled to schedule at a later date. She presented to the ED with intractable and increasing pain. Will plan on OR tomorrow.  Past Medical History:  Diagnosis Date   ADHD    Anxiety    Bipolar disorder (HCC)    Cervical spinal stenosis 12/27/2020   C6-7 level   Chronic back pain    Common migraine with intractable migraine 12/27/2020   Depression    Family history of breast cancer    Family history of melanoma    Hypertension    Kidney failure    Migraine    Neuropathy    PTSD (post-traumatic stress disorder)     Past Surgical History:  Procedure Laterality Date   DILATION AND CURETTAGE OF UTERUS     3 blood tranfusions    Family History  Problem Relation Age of Onset   Alcohol abuse Mother    Hypertension Mother    Hyperlipidemia Mother    Skin cancer Mother    Hypertension Father    Hyperlipidemia Father    Alcohol abuse Father    Alcohol abuse Brother    Hypertension Brother    Breast cancer Maternal Aunt        Neg GT in 2017   Melanoma Maternal Aunt    Hypertension Maternal Grandmother    Breast cancer Maternal Grandmother    Throat cancer Paternal Grandmother    Hypertension Paternal Grandfather    Breast cancer Other        Mother's 3 maternal cousins   Breast cancer Other    Melanoma Other        mother's maternal female first cousin   Social History:  reports that she has been smoking e-cigarettes. She has never used smokeless tobacco. She reports that she does not currently use alcohol. She reports current drug use. Drug: Marijuana.  Allergies:  Allergies  Allergen Reactions   Amoxicillin-Pot Clavulanate Rash    Strattera [Atomoxetine Hcl] Hypertension    (Not in a hospital admission)   Results for orders placed or performed during the hospital encounter of 01/27/21 (from the past 48 hour(s))  CBC     Status: None   Collection Time: 01/27/21  1:05 PM  Result Value Ref Range   WBC 7.5 4.0 - 10.5 K/uL   RBC 4.05 3.87 - 5.11 MIL/uL   Hemoglobin 12.4 12.0 - 15.0 g/dL   HCT 63.8 93.7 - 34.2 %   MCV 94.3 80.0 - 100.0 fL   MCH 30.6 26.0 - 34.0 pg   MCHC 32.5 30.0 - 36.0 g/dL   RDW 87.6 81.1 - 57.2 %   Platelets 208 150 - 400 K/uL   nRBC 0.0 0.0 - 0.2 %    Comment: Performed at [facility], [address], [place], [place] [address]  Basic metabolic panel     Status: Abnormal   Collection Time: 01/27/21  1:05 PM  Result Value Ref Range   Sodium 137 135 - 145 mmol/L   Potassium 4.2 3.5 - 5.1 mmol/L   Chloride 105 98 - 111 mmol/L   CO2 25 22 - 32 mmol/L   Glucose, Bld 93 70 - 99  mg/dL    Comment: Glucose reference range applies only to samples taken after fasting for at least 8 hours.   BUN 9 6 - 20 mg/dL   Creatinine, Ser 7.06 0.44 - 1.00 mg/dL   Calcium 8.7 (L) 8.9 - 10.3 mg/dL   GFR, Estimated >23 >76 mL/min    Comment: (NOTE) Calculated using the CKD-EPI Creatinine Equation (2021)    Anion gap 7 5 - 15    Comment: Performed at [facility], [address], [place], [place] [address]  Resp Panel by RT-PCR (Flu A&B, Covid) Nasopharyngeal Swab     Status: None   Collection Time: 01/27/21  1:35 PM   Specimen: Nasopharyngeal Swab; Nasopharyngeal(NP) swabs in vial transport medium  Result Value Ref Range   SARS Coronavirus 2 by RT PCR NEGATIVE NEGATIVE    Comment: (NOTE) SARS-CoV-2 target nucleic acids are NOT DETECTED.  The SARS-CoV-2 RNA is generally detectable in upper respiratory specimens during the acute phase of infection. The lowest concentration of SARS-CoV-2 viral copies this assay can detect is 138 copies/mL. A negative result does not preclude  SARS-Cov-2 infection and should not be used as the sole basis for treatment or other patient management decisions. A negative result may occur with  improper specimen collection/handling, submission of specimen other than nasopharyngeal swab, presence of viral mutation(s) within the areas targeted by this assay, and inadequate number of viral copies(<138 copies/mL). A negative result must be combined with clinical observations, patient history, and epidemiological information. The expected result is Negative.  Fact Sheet for Patients:  BloggerCourse.com  Fact Sheet for Healthcare Providers:  SeriousBroker.it  This test is no t yet approved or cleared by the [place] FDA and  has been authorized for detection and/or diagnosis of SARS-CoV-2 by FDA under an Emergency Use Authorization (EUA). This EUA will remain  in effect (meaning this test can be used) for the duration of the COVID-19 declaration under Section 564(b)(1) of the Act, 21 U.S.C.section 360bbb-3(b)(1), unless the authorization is terminated  or revoked sooner.       Influenza A by PCR NEGATIVE NEGATIVE   Influenza B by PCR NEGATIVE NEGATIVE    Comment: (NOTE) The Xpert Xpress SARS-CoV-2/FLU/RSV plus assay is intended as an aid in the diagnosis of influenza from Nasopharyngeal swab specimens and should not be used as a sole basis for treatment. Nasal washings and aspirates are unacceptable for Xpert Xpress SARS-CoV-2/FLU/RSV testing.  Fact Sheet for Patients: BloggerCourse.com  Fact Sheet for Healthcare Providers: SeriousBroker.it  This test is not yet approved or cleared by the [place] FDA and has been authorized for detection and/or diagnosis of SARS-CoV-2 by FDA under an Emergency Use Authorization (EUA). This EUA will remain in effect (meaning this test can be used) for the duration of the COVID-19  declaration under Section 564(b)(1) of the Act, 21 U.S.C. section 360bbb-3(b)(1), unless the authorization is terminated or revoked.  Performed at [facility], [address], [place], [place] [address]   Urinalysis, Routine w reflex microscopic Urine, Clean Catch     Status: Abnormal   Collection Time: 01/27/21  1:35 PM  Result Value Ref Range   Color, Urine YELLOW YELLOW   APPearance CLEAR CLEAR   Specific Gravity, Urine 1.012 1.005 - 1.030   pH 6.0 5.0 - 8.0   Glucose, UA NEGATIVE NEGATIVE mg/dL   Hgb urine dipstick NEGATIVE NEGATIVE   Bilirubin Urine NEGATIVE NEGATIVE   Ketones, ur 5 (A) NEGATIVE mg/dL   Protein,  ur NEGATIVE NEGATIVE mg/dL   Nitrite NEGATIVE NEGATIVE   Leukocytes,Ua NEGATIVE NEGATIVE    Comment: Performed at [facility], [address] [place], [place] [address]  CBC WITH DIFFERENTIAL     Status: None   Collection Time: 01/27/21  3:53 PM  Result Value Ref Range   WBC 7.5 4.0 - 10.5 K/uL   RBC 4.31 3.87 - 5.11 MIL/uL   Hemoglobin 13.4 12.0 - 15.0 g/dL   HCT 60.440.1 54.036.0 - 98.146.0 %   MCV 93.0 80.0 - 100.0 fL   MCH 31.1 26.0 - 34.0 pg   MCHC 33.4 30.0 - 36.0 g/dL   RDW 19.112.6 47.811.5 - 29.515.5 %   Platelets 217 150 - 400 K/uL   nRBC 0.0 0.0 - 0.2 %   Neutrophils Relative % 86 %   Neutro Abs 6.5 1.7 - 7.7 K/uL   Lymphocytes Relative 11 %   Lymphs Abs 0.8 0.7 - 4.0 K/uL   Monocytes Relative 2 %   Monocytes Absolute 0.1 0.1 - 1.0 K/uL   Eosinophils Relative 0 %   Eosinophils Absolute 0.0 0.0 - 0.5 K/uL   Basophils Relative 0 %   Basophils Absolute 0.0 0.0 - 0.1 K/uL   Immature Granulocytes 1 %   Abs Immature Granulocytes 0.04 0.00 - 0.07 K/uL    Comment: Performed at [facility], [address] [place], [place] [address]  APTT     Status: None   Collection Time: 01/27/21  3:53 PM  Result Value Ref Range   aPTT 27 24 - 36 seconds    Comment: Performed at [facility], [address] [place], [place] [address]  Protime-INR     Status:  None   Collection Time: 01/27/21  3:53 PM  Result Value Ref Range   Prothrombin Time 13.0 11.4 - 15.2 seconds   INR 1.0 0.8 - 1.2    Comment: (NOTE) INR goal varies based on device and disease states. Performed at [facility], [address] [place], [place] [address]    No results found.  Review of Systems  Blood pressure (!) 130/101, pulse 69, temperature 98.8 F (37.1 C), temperature source Oral, resp. rate 16, SpO2 99 %. Physical Exam Constitutional:      General: She is not in acute distress.    Appearance: Normal appearance. She is normal weight. She is not toxic-appearing.  HENT:     Head: Normocephalic and atraumatic.     Nose: Nose normal.     Mouth/Throat:     Mouth: Mucous membranes are moist.     Pharynx: Oropharynx is clear.  Eyes:     Extraocular Movements: Extraocular movements intact.     Pupils: Pupils are equal, round, and reactive to light.  Neck:     Comments: Mildly restricted rotation Cardiovascular:     Rate and Rhythm: Normal rate and regular rhythm.     Pulses: Normal pulses.  Pulmonary:     Effort: Pulmonary effort is normal.  Abdominal:     General: Abdomen is flat.  Musculoskeletal:        General: Normal range of motion.  Skin:    General: Skin is warm and dry.  Neurological:     General: No focal deficit present.     Mental Status: She is alert and oriented to person, place, and time.     Cranial Nerves: No cranial nerve deficit.     Sensory: No sensory deficit.  Motor: Weakness present.     Coordination: Coordination normal.     Gait: Gait normal.     Deep Tendon Reflexes: Reflexes normal.  Psychiatric:        Mood and Affect: Mood normal.        Behavior: Behavior normal.        Thought Content: Thought content normal.        Judgment: Judgment normal.     Assessment/Plan OR for ACDF at C6/7.BP (!) 130/101   Pulse 69   Temp 98.8 F (37.1 C) (Oral)   Resp 16   SpO2 99%  Ms. Bloodsworth has decided to undergo an  anterior cervical decompression and arthrodesis for stenosis at levels C6/7. Risks and benefits including but not limited to bleeding, infection, paralysis, weakness in one or both extremities, bowel and/or bladder dysfunction, fusion failure, hardware failure, need for further surgery, no relief of pain. She understands and wishes to proceed.   Coletta Memos, MD 01/27/2021, 7:00 PM

## 2021-01-27 NOTE — ED Provider Notes (Signed)
[facility] EMERGENCY DEPARTMENT Provider Note   CSN: [id number] Arrival date & time: 01/27/21  1107     History Chief Complaint  Patient presents with   Back Pain    AARILYN DYE is a 40 y.o. female with a history of hypertension, migraines, PTSD, cervical spinal stenosis, and depression who presents to the emergency department with complaints of back pain that has been going on for an extended period of time.  Patient states he has been dealing with pain to the left side of her upper and mid back near the scapula that radiates into the left upper extremity with associated left upper extremity weakness and intermittent paresthesias for quite some time now.  She has seen multiple specialists outpatient with abnormal C-spine MRI- she is followed by neurosurgeon Dr. Franky Macho and there were plans for ACDF C5-6 today, however the surgery was cancelled due to an administrative/insurance issue per the patient. She states the pain is unbearable and is requesting we contact her neurosurgery group. She is currently taking diclofenac at home for pain with minimal relief. She denies any change in her weakness/intermittent paresthesias. She states sometimes she has palpitations which she thinks might be due to anxiety related to everything. She denies fever, chills, hx of IVDU, incontinence, saddle anesthesia, hemoptysis, dyspnea, or leg pain/swelling.    HPI     Past Medical History:  Diagnosis Date   ADHD    Anxiety    Bipolar disorder (HCC)    Cervical spinal stenosis 12/27/2020   C6-7 level   Chronic back pain    Common migraine with intractable migraine 12/27/2020   Depression    Family history of breast cancer    Family history of melanoma    Hypertension    Kidney failure    Migraine    Neuropathy    PTSD (post-traumatic stress disorder)     Patient Active Problem List   Diagnosis Date Noted   Genetic testing 01/10/2021   Family history of breast cancer  12/28/2020   Family history of melanoma 12/28/2020   Cervical spinal stenosis 12/27/2020   Common migraine with intractable migraine 12/27/2020    Past Surgical History:  Procedure Laterality Date   DILATION AND CURETTAGE OF UTERUS     3 blood tranfusions     OB History   No obstetric history on file.     Family History  Problem Relation Age of Onset   Alcohol abuse Mother    Hypertension Mother    Hyperlipidemia Mother    Skin cancer Mother    Hypertension Father    Hyperlipidemia Father    Alcohol abuse Father    Alcohol abuse Brother    Hypertension Brother    Breast cancer Maternal Aunt        Neg GT in 2017   Melanoma Maternal Aunt    Hypertension Maternal Grandmother    Breast cancer Maternal Grandmother    Throat cancer Paternal Grandmother    Hypertension Paternal Grandfather    Breast cancer Other        Mother's 3 maternal cousins   Breast cancer Other    Melanoma Other        mother's maternal female first cousin    Social History   Tobacco Use   Smoking status: Every Day    Types: E-cigarettes   Smokeless tobacco: Never  Vaping Use   Vaping Use: Every day  Substance Use Topics   Alcohol use: Not Currently  Drug use: Yes    Types: Marijuana    Comment: to help with pain    Home Medications Prior to Admission medications   Medication Sig Start Date End Date Taking? Authorizing Provider  cloNIDine (CATAPRES) 0.1 MG tablet Take 0.1 mg by mouth 2 (two) times daily. 11/15/20  Yes [provider]  diclofenac (CATAFLAM) 50 MG tablet Take 1 tablet (50 mg total) by mouth 3 (three) times daily as needed. 12/27/20  Yes York Spaniel, MD  lamoTRIgine (LAMICTAL) 200 MG tablet Take 200 mg by mouth at bedtime. 11/29/20  Yes [provider]  levonorgestrel (MIRENA) 20 MCG/24HR IUD by Intrauterine route.   Yes [provider]  QUEtiapine (SEROQUEL) 25 MG tablet Take 75-100 mg by mouth at bedtime. 07/25/20  Yes [provider]     Allergies    Amoxicillin-pot clavulanate and Strattera [atomoxetine hcl]  Review of Systems   Review of Systems  Constitutional:  Negative for chills and fever.  Respiratory:  Negative for shortness of breath.   Cardiovascular:  Positive for palpitations.  Gastrointestinal:  Negative for abdominal pain and vomiting.  Genitourinary:  Negative for dysuria and hematuria.  Musculoskeletal:  Positive for arthralgias and back pain.  Neurological:  Positive for weakness and numbness.  All other systems reviewed and are negative.  Physical Exam Updated Vital Signs BP (!) 172/106   Pulse 63   Temp 98.8 F (37.1 C) (Oral)   Resp 16   SpO2 100%   Physical Exam Vitals and nursing note reviewed.  Constitutional:      General: She is not in acute distress.    Appearance: Normal appearance. She is not ill-appearing or toxic-appearing.  HENT:     Head: Normocephalic and atraumatic.  Neck:     Comments: No midline tenderness.  Cardiovascular:     Rate and Rhythm: Normal rate and regular rhythm.     Pulses:          Radial pulses are 2+ on the right side and 2+ on the left side.  Pulmonary:     Effort: Pulmonary effort is normal. No respiratory distress.     Breath sounds: Normal breath sounds.  Abdominal:     General: There is no distension.     Palpations: Abdomen is soft.     Tenderness: There is no abdominal tenderness.  Musculoskeletal:     Cervical back: Normal range of motion and neck supple.     Comments: Back: Patient is tender to the left cervical and thoracic paraspinal muscles.  No point/focal vertebral tenderness. Upper extremities: No obvious deformity, appreciable swelling, edema, erythema, ecchymosis, warmth, or open wounds. Patient has intact AROM throughout.  No focal bony tenderness.  Skin:    General: Skin is warm and dry.     Capillary Refill: Capillary refill takes less than 2 seconds.  Neurological:     Mental Status: She is alert.     Comments: Alert.  Clear speech. Sensation grossly intact to bilateral upper extremities. 5/5 symmetric grip strength and strength with elbow flexion/extension.. Ambulatory.   Psychiatric:        Mood and Affect: Mood normal.        Behavior: Behavior normal.    ED Results / Procedures / Treatments   Labs (all labs ordered are listed, but only abnormal results are displayed) Labs Reviewed  CBC  BASIC METABOLIC PANEL  POC URINE PREG, ED    EKG None  Radiology No results found.  Procedures Procedures  Medications Ordered in ED Medications  lidocaine (LIDODERM) 5 % 1 patch (1 patch Transdermal Patch Applied 01/27/21 1314)  acetaminophen (TYLENOL) tablet 1,000 mg (1,000 mg Oral Given 01/27/21 1137)  HYDROmorphone (DILAUDID) injection 1 mg (1 mg Intravenous Given 01/27/21 1314)  dexamethasone (DECADRON) injection 10 mg (10 mg Intravenous Given 01/27/21 1313)    ED Course  I have reviewed the triage vital signs and the nursing notes.  Pertinent labs & imaging results that were available during my care of the patient were reviewed by me and considered in my medical decision making (see chart for details).    MDM Rules/Calculators/A&P                           Patient presents to the ED with complaints of left-sided neck/back pain which has been ongoing for years, followed by outpatient neurology, neurosurgery, and orthopedics.  She was planned to have cervical spine surgery this morning however this was canceled.  She is nontoxic, her blood pressure is elevated, I have a low suspicion for hypertensive emergency.  She has not had any acute change in her neurologic symptoms therefore do not feel that repeat imaging is necessary at this time, will check an EKG and basic blood work given her reports of palpitations, plan for pain management, and page neurosurgery at patient's request.  Additional history obtained:  Additional history obtained from chart review & nursing note review.   MRI cervical  12/23/20:   IMPRESSION:  Cervical spondylosis, as outlined and with findings most notably as  follows.   At C6-C7, there is trace grade 1 retrolisthesis. Moderate to  moderately advanced disc degeneration with mild degenerative  endplate edema. A broad-based central/right center disc protrusion  with associated osteophyte ridge contributes to multifactorial  severe spinal canal stenosis with moderate spinal cord flattening.  Multifactorial bilateral neural foraminal narrowing (moderate/severe  right, moderate left).   At C5-C6, there is mild-to-moderate disc degeneration. Central  posterior annular fissure. Disc bulge. Mild bilateral uncovertebral  hypertrophy. Mild partial effacement of ventral thecal sac without  spinal cord mass effect. No significant foraminal stenosis.   No significant disc herniation, spinal canal stenosis or neural  foraminal narrowing at the remaining levels.    EKG: Sinus rhythm.   Lab Tests:  I Ordered, reviewed, and interpreted labs, which included:  CBC/BMP: unremarkable.   ED Course:  Discussed with neurosurgery team- Dr. Mikal Plane is in the OR currently- plans to come to the ED to see the patient. Appreciate consultation.   On re-assessment patient is feeling anxious, states she has taken klonopin/ativan in the past, will give 0.5 mg of ativan.   15:25: Patient care signed out to Fayrene Helper PA-C at change of shift pending neurosurgery evaluation of the patient & disposition.   Portions of this note were generated with Scientist, clinical (histocompatibility and immunogenetics). Dictation errors may occur despite best attempts at proofreading.  Final Clinical Impression(s) / ED Diagnoses Final diagnoses:  None    Rx / DC Orders ED Discharge Orders     None        Cherly Anderson, PA-C 01/27/21 1526    Arby Barrette, MD 02/12/21 1557

## 2021-01-28 ENCOUNTER — Observation Stay (HOSPITAL_COMMUNITY): Payer: Medicaid Other

## 2021-01-28 ENCOUNTER — Encounter (HOSPITAL_COMMUNITY): Payer: Self-pay | Admitting: Neurosurgery

## 2021-01-28 ENCOUNTER — Observation Stay (HOSPITAL_COMMUNITY): Payer: Medicaid Other | Admitting: Anesthesiology

## 2021-01-28 ENCOUNTER — Encounter (HOSPITAL_COMMUNITY)
Admission: EM | Disposition: A | Discharge status: Home / Self Care | Payer: Self-pay | Source: Home / Self Care | Attending: Emergency Medicine

## 2021-01-28 DIAGNOSIS — M50323 Other cervical disc degeneration at C6-C7 level: Secondary | ICD-10-CM | POA: Diagnosis not present

## 2021-01-28 DIAGNOSIS — Z20822 Contact with and (suspected) exposure to covid-19: Secondary | ICD-10-CM | POA: Diagnosis not present

## 2021-01-28 DIAGNOSIS — I10 Essential (primary) hypertension: Secondary | ICD-10-CM | POA: Diagnosis not present

## 2021-01-28 DIAGNOSIS — M4802 Spinal stenosis, cervical region: Secondary | ICD-10-CM | POA: Diagnosis not present

## 2021-01-28 HISTORY — PX: ANTERIOR CERVICAL DECOMP/DISCECTOMY FUSION: SHX1161

## 2021-01-28 LAB — SURGICAL PCR SCREEN
MRSA, PCR: NEGATIVE
Staphylococcus aureus: NEGATIVE

## 2021-01-28 LAB — PREGNANCY, URINE: Preg Test, Ur: NEGATIVE

## 2021-01-28 IMAGING — CR DG CERVICAL SPINE 2 OR 3 VIEWS
2 series · 2 of 2 positions shown · non-contrast
Comparison: None.

CLINICAL DATA: ACDF C6-7

EXAM:
CERVICAL SPINE - 2-3 VIEW

[xtable (1 of 2)]
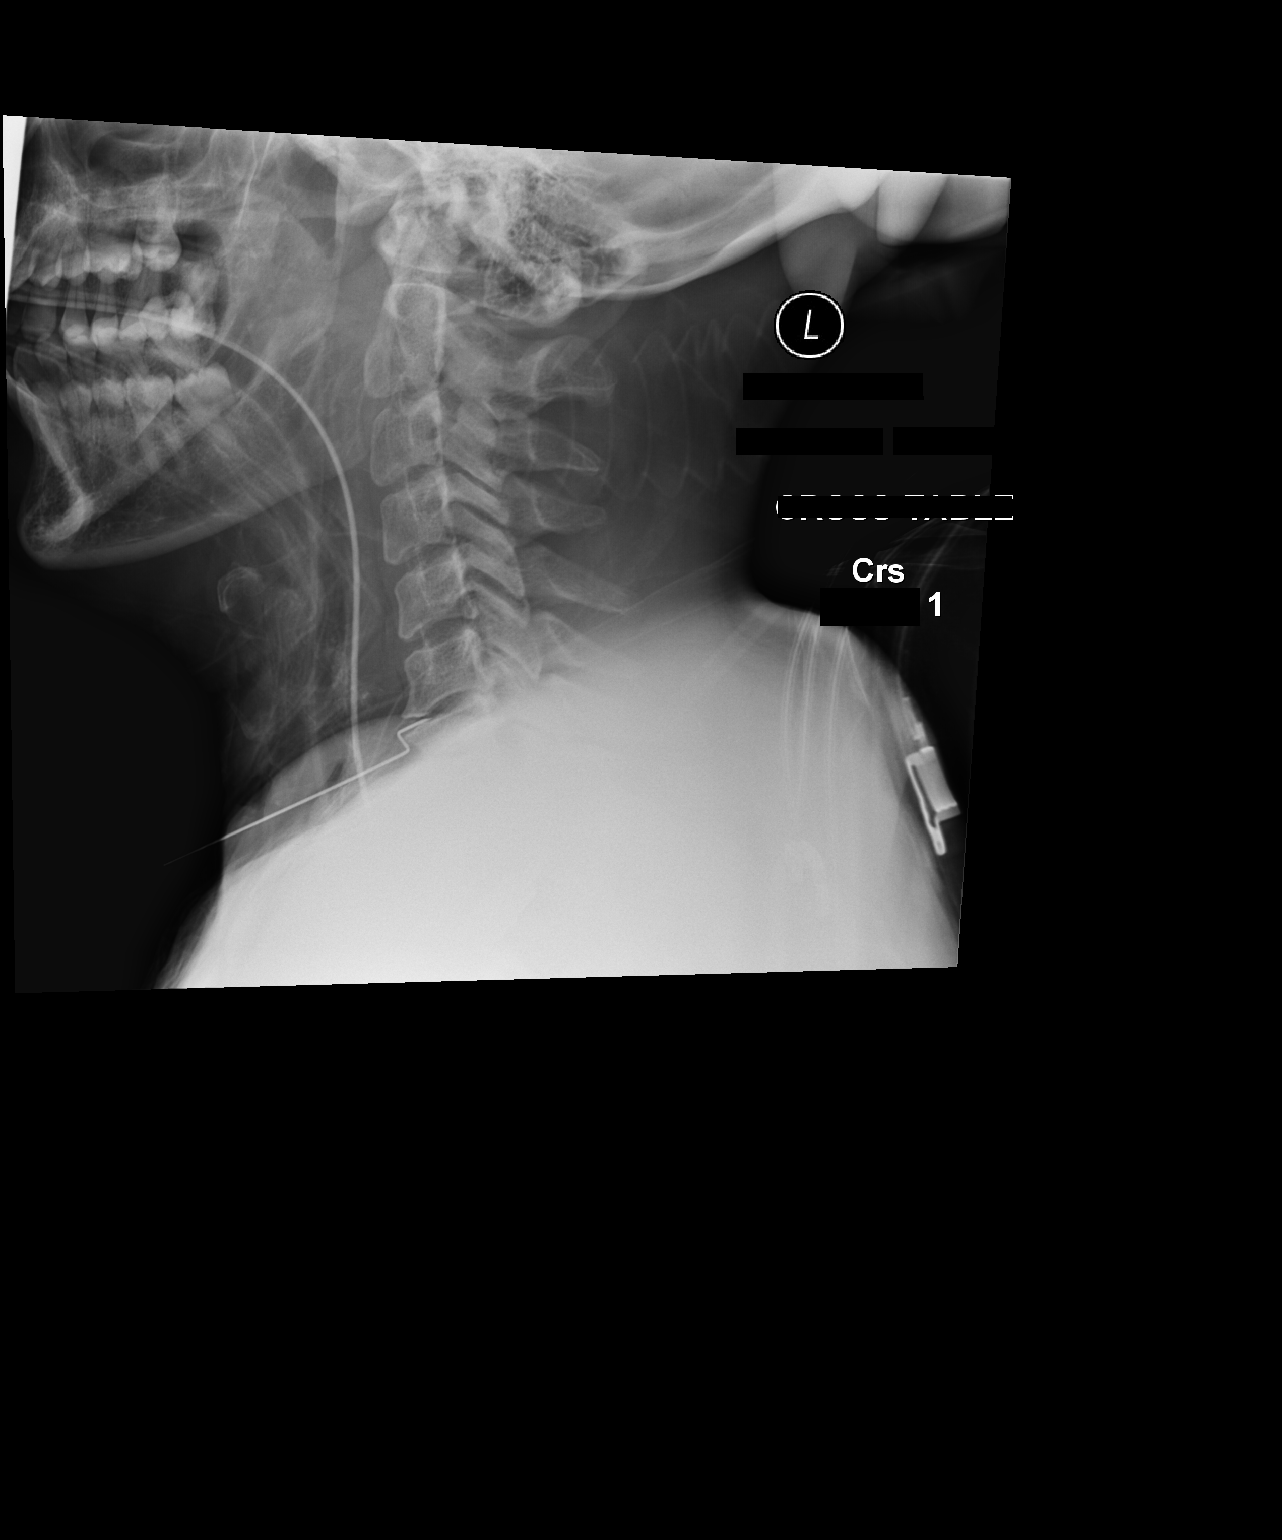

[xtable (2 of 2)]
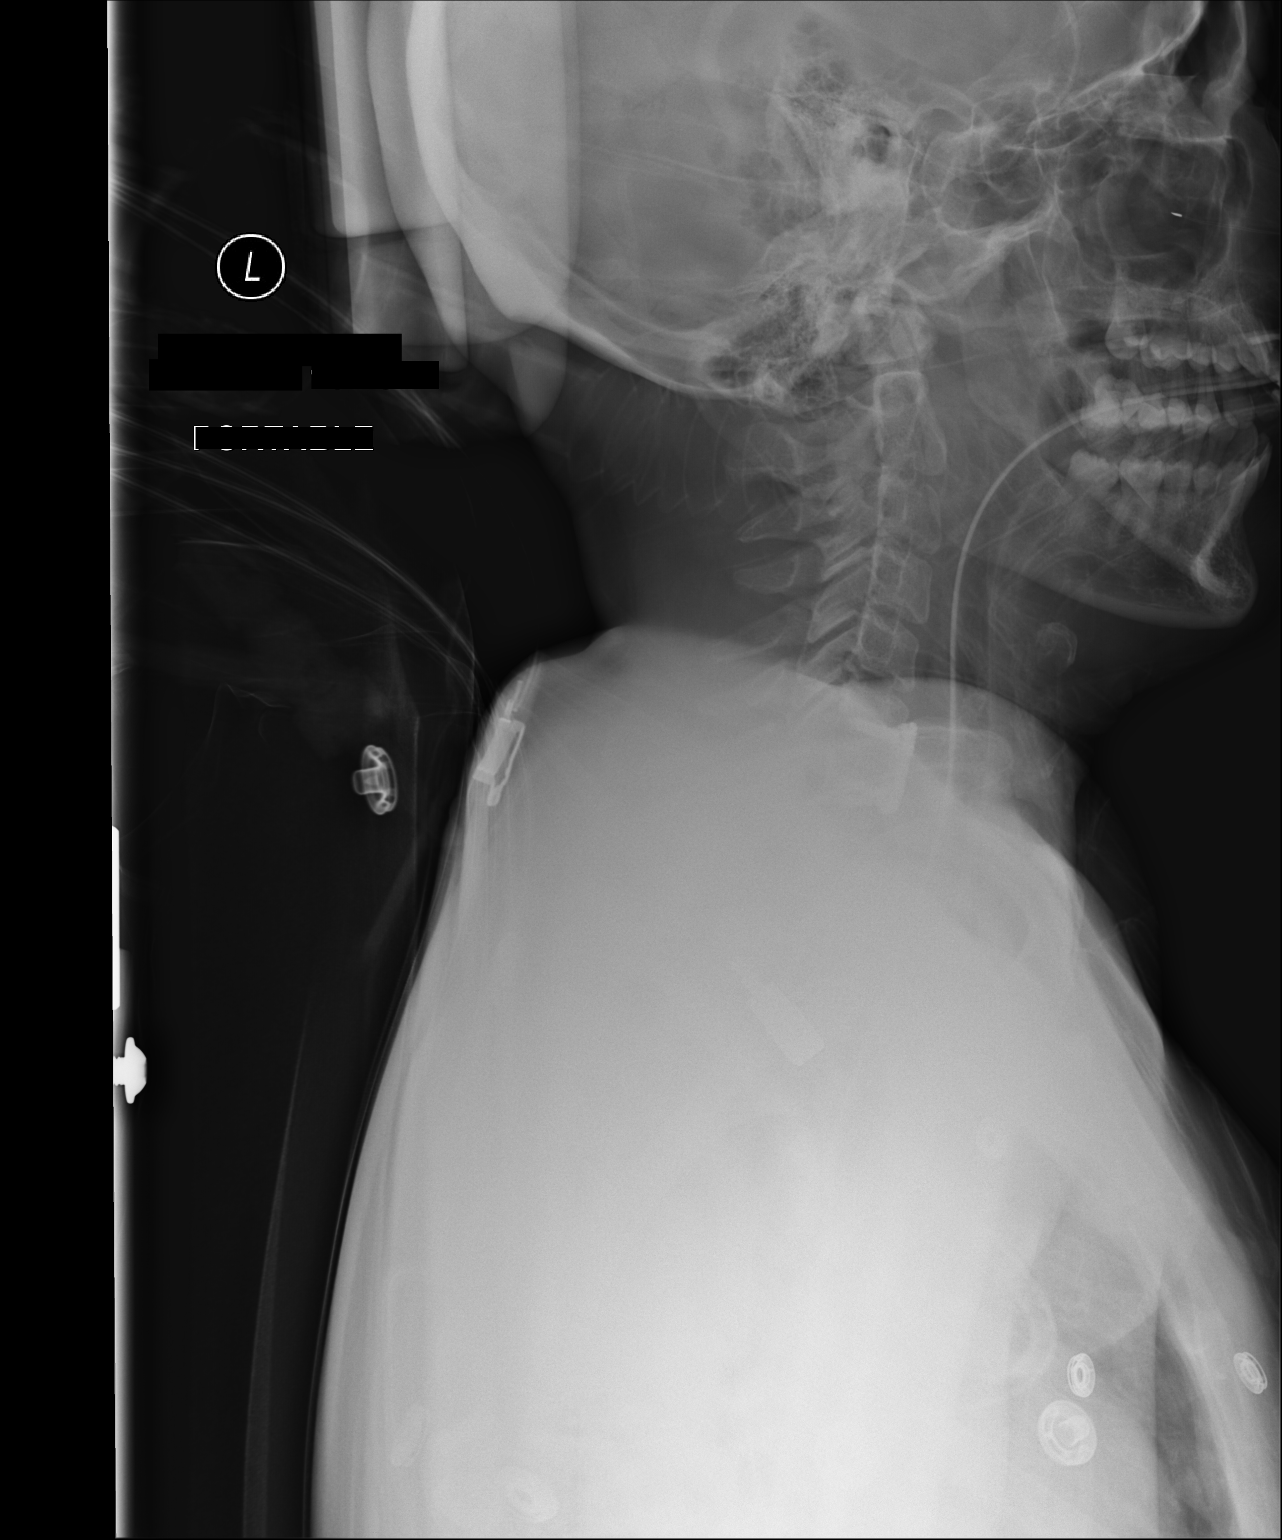

[2 of 2 positions shown; findings below may reference images not displayed]

FINDINGS: First cross-table lateral view demonstrates anterior localizing
instrument at C6-7. Second lateral intraoperative view demonstrates
anterior plate at C6-7. Normal alignment. No complicating feature.
IMPRESSION: ACDF C6-7.  No visible complicating feature.

## 2021-01-28 SURGERY — ANTERIOR CERVICAL DECOMPRESSION/DISCECTOMY FUSION 1 LEVEL
Anesthesia: General | Site: Spine Cervical

## 2021-01-28 MED ORDER — CHLORHEXIDINE GLUCONATE 0.12 % MT SOLN
15.0000 mL | Freq: Once | OROMUCOSAL | Status: AC
  Administered 2021-01-28: 15 mL via OROMUCOSAL
  Filled 2021-01-28: qty 15

## 2021-01-28 MED ORDER — SENNOSIDES-DOCUSATE SODIUM 8.6-50 MG PO TABS: 1.0000 | ORAL_TABLET | Freq: Every evening | ORAL | Status: DC | PRN

## 2021-01-28 MED ORDER — ONDANSETRON HCL 4 MG/2ML IJ SOLN
INTRAMUSCULAR | Status: DC | PRN
  Administered 2021-01-28: 4 mg via INTRAVENOUS

## 2021-01-28 MED ORDER — HYDROMORPHONE HCL 1 MG/ML IJ SOLN
INTRAMUSCULAR | Status: AC
  Filled 2021-01-28: qty 1

## 2021-01-28 MED ORDER — ROCURONIUM BROMIDE 10 MG/ML (PF) SYRINGE
PREFILLED_SYRINGE | INTRAVENOUS | Status: DC | PRN
  Administered 2021-01-28: 60 mg via INTRAVENOUS
  Administered 2021-01-28: 20 mg via INTRAVENOUS

## 2021-01-28 MED ORDER — KETOROLAC TROMETHAMINE 30 MG/ML IJ SOLN
30.0000 mg | Freq: Once | INTRAMUSCULAR | Status: AC
  Administered 2021-01-28: 30 mg via INTRAVENOUS

## 2021-01-28 MED ORDER — MEPERIDINE HCL 25 MG/ML IJ SOLN
6.2500 mg | INTRAMUSCULAR | Status: DC | PRN
  Administered 2021-01-28 (×2): 12.5 mg via INTRAVENOUS

## 2021-01-28 MED ORDER — LACTATED RINGERS IV SOLN: INTRAVENOUS | Status: DC | PRN

## 2021-01-28 MED ORDER — PROPOFOL 10 MG/ML IV BOLUS
INTRAVENOUS | Status: DC | PRN
  Administered 2021-01-28: 200 mg via INTRAVENOUS

## 2021-01-28 MED ORDER — DOCUSATE SODIUM 100 MG PO CAPS
100.0000 mg | ORAL_CAPSULE | Freq: Two times a day (BID) | ORAL | Status: DC
  Administered 2021-01-28 – 2021-01-30 (×4): 100 mg via ORAL
  Filled 2021-01-28 (×5): qty 1

## 2021-01-28 MED ORDER — FENTANYL CITRATE (PF) 250 MCG/5ML IJ SOLN
INTRAMUSCULAR | Status: DC | PRN
  Administered 2021-01-28: 100 ug via INTRAVENOUS
  Administered 2021-01-28 (×3): 50 ug via INTRAVENOUS

## 2021-01-28 MED ORDER — VANCOMYCIN HCL IN DEXTROSE 1-5 GM/200ML-% IV SOLN
1000.0000 mg | INTRAVENOUS | Status: AC
  Administered 2021-01-28: 1000 mg via INTRAVENOUS
  Filled 2021-01-28 (×2): qty 200

## 2021-01-28 MED ORDER — SODIUM CHLORIDE 0.9% FLUSH: 3.0000 mL | INTRAVENOUS | Status: DC | PRN

## 2021-01-28 MED ORDER — LACTATED RINGERS IV SOLN: INTRAVENOUS | Status: DC

## 2021-01-28 MED ORDER — ORAL CARE MOUTH RINSE: 15.0000 mL | Freq: Once | OROMUCOSAL | Status: AC

## 2021-01-28 MED ORDER — LORAZEPAM 2 MG/ML IJ SOLN
0.5000 mg | Freq: Once | INTRAMUSCULAR | Status: AC
  Administered 2021-01-28: 0.5 mg via INTRAVENOUS
  Filled 2021-01-28: qty 1

## 2021-01-28 MED ORDER — KETOROLAC TROMETHAMINE 30 MG/ML IJ SOLN
INTRAMUSCULAR | Status: AC
  Filled 2021-01-28: qty 1

## 2021-01-28 MED ORDER — CHLORHEXIDINE GLUCONATE CLOTH 2 % EX PADS: 6.0000 | MEDICATED_PAD | Freq: Once | CUTANEOUS | Status: DC

## 2021-01-28 MED ORDER — MIDAZOLAM HCL 2 MG/2ML IJ SOLN
0.5000 mg | Freq: Once | INTRAMUSCULAR | Status: AC | PRN
  Administered 2021-01-28: 0.5 mg via INTRAVENOUS

## 2021-01-28 MED ORDER — HYDROMORPHONE HCL 1 MG/ML IJ SOLN
0.2500 mg | INTRAMUSCULAR | Status: DC | PRN
  Administered 2021-01-28 (×4): 0.5 mg via INTRAVENOUS

## 2021-01-28 MED ORDER — HEMOSTATIC AGENTS (NO CHARGE) OPTIME
TOPICAL | Status: DC | PRN
  Administered 2021-01-28: 1 via TOPICAL

## 2021-01-28 MED ORDER — THROMBIN 5000 UNITS EX SOLR
CUTANEOUS | Status: DC | PRN
  Administered 2021-01-28 (×2): 5000 [IU] via TOPICAL

## 2021-01-28 MED ORDER — MENTHOL 3 MG MT LOZG: 1.0000 | LOZENGE | OROMUCOSAL | Status: DC | PRN

## 2021-01-28 MED ORDER — OXYCODONE HCL 5 MG/5ML PO SOLN: 5.0000 mg | Freq: Once | ORAL | Status: DC | PRN

## 2021-01-28 MED ORDER — LIDOCAINE-EPINEPHRINE 0.5 %-1:200000 IJ SOLN
INTRAMUSCULAR | Status: AC
  Filled 2021-01-28: qty 1

## 2021-01-28 MED ORDER — PHENOL 1.4 % MT LIQD
1.0000 | OROMUCOSAL | Status: DC | PRN
  Administered 2021-01-28: 1 via OROMUCOSAL
  Filled 2021-01-28: qty 177

## 2021-01-28 MED ORDER — 0.9 % SODIUM CHLORIDE (POUR BTL) OPTIME
TOPICAL | Status: DC | PRN
  Administered 2021-01-28: 1000 mL

## 2021-01-28 MED ORDER — MEPERIDINE HCL 25 MG/ML IJ SOLN
INTRAMUSCULAR | Status: AC
  Filled 2021-01-28: qty 1

## 2021-01-28 MED ORDER — LIDOCAINE-EPINEPHRINE 0.5 %-1:200000 IJ SOLN
INTRAMUSCULAR | Status: DC | PRN
  Administered 2021-01-28: 3 mL

## 2021-01-28 MED ORDER — DEXAMETHASONE SODIUM PHOSPHATE 10 MG/ML IJ SOLN
INTRAMUSCULAR | Status: DC | PRN
  Administered 2021-01-28: 10 mg via INTRAVENOUS

## 2021-01-28 MED ORDER — MIDAZOLAM HCL 2 MG/2ML IJ SOLN
1.5000 mg | Freq: Once | INTRAMUSCULAR | Status: AC
  Administered 2021-01-28: 1.5 mg via INTRAVENOUS

## 2021-01-28 MED ORDER — THROMBIN 5000 UNITS EX SOLR
CUTANEOUS | Status: AC
  Filled 2021-01-28: qty 10000

## 2021-01-28 MED ORDER — MIDAZOLAM HCL 2 MG/2ML IJ SOLN
INTRAMUSCULAR | Status: DC | PRN
  Administered 2021-01-28: 2 mg via INTRAVENOUS

## 2021-01-28 MED ORDER — SODIUM CHLORIDE 0.9% FLUSH
3.0000 mL | Freq: Two times a day (BID) | INTRAVENOUS | Status: DC
  Administered 2021-01-28 – 2021-01-30 (×4): 3 mL via INTRAVENOUS

## 2021-01-28 MED ORDER — OXYCODONE HCL 5 MG PO TABS: 5.0000 mg | ORAL_TABLET | Freq: Once | ORAL | Status: DC | PRN

## 2021-01-28 MED ORDER — SODIUM CHLORIDE 0.9 % IV SOLN: 250.0000 mL | INTRAVENOUS | Status: DC

## 2021-01-28 MED ORDER — SUGAMMADEX SODIUM 200 MG/2ML IV SOLN
INTRAVENOUS | Status: DC | PRN
  Administered 2021-01-28: 200 mg via INTRAVENOUS

## 2021-01-28 MED ORDER — MIDAZOLAM HCL 2 MG/2ML IJ SOLN
INTRAMUSCULAR | Status: AC
  Filled 2021-01-28: qty 2

## 2021-01-28 MED ORDER — PROMETHAZINE HCL 25 MG/ML IJ SOLN: 6.2500 mg | INTRAMUSCULAR | Status: DC | PRN

## 2021-01-28 MED ORDER — LIDOCAINE 2% (20 MG/ML) 5 ML SYRINGE
INTRAMUSCULAR | Status: DC | PRN
  Administered 2021-01-28: 20 mg via INTRAVENOUS

## 2021-01-28 SURGICAL SUPPLY — 45 items
ALLOGRAFT CA 6X14X11 (Bone Implant) ×2 IMPLANT
BAG COUNTER SPONGE SURGICOUNT (BAG) ×4 IMPLANT
BAND RUBBER #18 3X1/16 STRL (MISCELLANEOUS) ×4 IMPLANT
BUR DRUM 4.0 (BURR) ×2 IMPLANT
BUR MATCHSTICK NEURO 3.0 LAGG (BURR) ×2 IMPLANT
CANISTER SUCT 3000ML PPV (MISCELLANEOUS) ×2 IMPLANT
CARTRIDGE OIL MAESTRO DRILL (MISCELLANEOUS) ×1 IMPLANT
DERMABOND ADVANCED (GAUZE/BANDAGES/DRESSINGS) ×1
DERMABOND ADVANCED .7 DNX12 (GAUZE/BANDAGES/DRESSINGS) ×1 IMPLANT
DIFFUSER DRILL AIR PNEUMATIC (MISCELLANEOUS) ×2 IMPLANT
DRAPE LAPAROTOMY 100X72 PEDS (DRAPES) ×2 IMPLANT
DRAPE MICROSCOPE LEICA (MISCELLANEOUS) ×2 IMPLANT
DURAPREP 6ML APPLICATOR 50/CS (WOUND CARE) ×2 IMPLANT
ELECT COATED BLADE 2.86 ST (ELECTRODE) ×2 IMPLANT
ELECT REM PT RETURN 9FT ADLT (ELECTROSURGICAL) ×2
ELECTRODE REM PT RTRN 9FT ADLT (ELECTROSURGICAL) ×1 IMPLANT
GAUZE 4X4 16PLY ~~LOC~~+RFID DBL (SPONGE) ×2 IMPLANT
GLOVE SURG LTX SZ6.5 (GLOVE) ×2 IMPLANT
GLOVE SURG LTX SZ7 (GLOVE) ×6 IMPLANT
GOWN STRL REUS W/ TWL LRG LVL3 (GOWN DISPOSABLE) ×2 IMPLANT
GOWN STRL REUS W/ TWL XL LVL3 (GOWN DISPOSABLE) ×1 IMPLANT
GOWN STRL REUS W/TWL 2XL LVL3 (GOWN DISPOSABLE) ×4 IMPLANT
GOWN STRL REUS W/TWL LRG LVL3 (GOWN DISPOSABLE) ×2
GOWN STRL REUS W/TWL XL LVL3 (GOWN DISPOSABLE) ×1
KIT BASIN OR (CUSTOM PROCEDURE TRAY) ×2 IMPLANT
KIT TURNOVER KIT B (KITS) ×2 IMPLANT
NEEDLE HYPO 25X1 1.5 SAFETY (NEEDLE) ×2 IMPLANT
NEEDLE SPNL 22GX3.5 QUINCKE BK (NEEDLE) ×2 IMPLANT
NS IRRIG 1000ML POUR BTL (IV SOLUTION) ×2 IMPLANT
OIL CARTRIDGE MAESTRO DRILL (MISCELLANEOUS) ×2
PACK LAMINECTOMY NEURO (CUSTOM PROCEDURE TRAY) ×2 IMPLANT
PAD ARMBOARD 7.5X6 YLW CONV (MISCELLANEOUS) ×6 IMPLANT
PIN DISTRACTION 14MM (PIN) IMPLANT
PLATE ACP 1-LEVEL 1.6V20 (Plate) ×2 IMPLANT
SCREW ACP 3.5 X 13 S/D VARIA (Screw) ×4 IMPLANT
SCREW ACP 3.5X13 S/D VAR ANGLE (Screw) ×2 IMPLANT
SCREW ACP ST VARI 3.5X13 (Screw) ×4 IMPLANT
SPONGE INTESTINAL PEANUT (DISPOSABLE) ×2 IMPLANT
SPONGE SURGIFOAM ABS GEL SZ50 (HEMOSTASIS) ×2 IMPLANT
SUT VIC AB 0 CT1 27 (SUTURE)
SUT VIC AB 0 CT1 27XBRD ANTBC (SUTURE) IMPLANT
SUT VIC AB 3-0 SH 8-18 (SUTURE) ×2 IMPLANT
TOWEL GREEN STERILE (TOWEL DISPOSABLE) ×2 IMPLANT
TOWEL GREEN STERILE FF (TOWEL DISPOSABLE) ×2 IMPLANT
WATER STERILE IRR 1000ML POUR (IV SOLUTION) ×2 IMPLANT

## 2021-01-28 NOTE — Progress Notes (Signed)
Patient ID: Tina Russell, female   DOB: [birth year], 40 y.o.   MRN: [id number] BP (!) 153/100   Pulse 82   Temp 97.9 F (36.6 C) (Oral)   Resp 18   Ht 5\' 4"  (1.626 m)   Wt 68 kg   LMP 01/14/2021   SpO2 100%   BMI 25.75 kg/m  Alert and oriented x 4, speech is clear and fluent Moving all extremities well Ready for the operating room

## 2021-01-28 NOTE — Progress Notes (Signed)
Very anxious.  Raising her voice at staff because IV pump beeping, although it is because she bent her arm and occluded the vein.  She refused to have the site changed, she refused to have the arm stabilizer placed to keep arm straight.  She became very angry and agitated because pt in room next door kept making noise and yelling out.  Said someone needs to help him, as this nurse was in the room with him helping him when she kept calling front desk to request help for him.  I did leave his room to go into her room to tell her I was actually in his room with him, as he was yelling out for help (explaining that he is calling out for help even when someone is in the room with him helping him).  She yells at staff then apologizes, saying we just have to get used to her moods.  I did remind her that we do understand she is anxious and upset and I did give her medication to help calm her, but that she cannot yell and cuss at staff.  She did apologize.

## 2021-01-28 NOTE — Transfer of Care (Signed)
Immediate Anesthesia Transfer of Care Note  Patient: GENEEN DIETER  Procedure(s) Performed: Cervical Six-Seven Anterior Cervical Decompression/Discectomy/Fusion (Spine Cervical)  Patient Location: PACU  Anesthesia Type:General  Level of Consciousness: awake and alert   Airway & Oxygen Therapy: Patient Spontanous Breathing and Patient connected to face mask oxygen  Post-op Assessment: Report given to RN and Post -op Vital signs reviewed and stable  Post vital signs: Reviewed and stable  Last Vitals:  Vitals Value Taken Time  BP 167/102 01/28/21 1041  Temp    Pulse 102 01/28/21 1044  Resp 24 01/28/21 1044  SpO2 97 % 01/28/21 1044  Vitals shown include unvalidated device data.  Last Pain:  Vitals:   01/28/21 0810  TempSrc: Oral  PainSc:          Complications: No notable events documented.

## 2021-01-28 NOTE — Anesthesia Procedure Notes (Signed)
Procedure Name: Intubation Date/Time: 01/28/2021 9:00 AM Performed by: Carlos American, CRNA Pre-anesthesia Checklist: Patient identified, Emergency Drugs available, Suction available and Patient being monitored Patient Re-evaluated:Patient Re-evaluated prior to induction Oxygen Delivery Method: Circle System Utilized Preoxygenation: Pre-oxygenation with 100% oxygen Induction Type: IV induction Ventilation: Mask ventilation without difficulty Laryngoscope Size: Glidescope and 3 Grade View: Grade I Tube type: Oral Tube size: 7.0 mm Number of attempts: 1 Airway Equipment and Method: Rigid stylet and Video-laryngoscopy Placement Confirmation: ETT inserted through vocal cords under direct vision, positive ETCO2 and breath sounds checked- equal and bilateral Secured at: 21 cm Tube secured with: Tape Dental Injury: Teeth and Oropharynx as per pre-operative assessment

## 2021-01-28 NOTE — Progress Notes (Signed)
   01/28/21 0124  Provider Notification  Provider Name/Title NP Leo Grosser  Date Provider Notified 01/28/21  Time Provider Notified [phone]  Notification Type Page  Notification Reason Other (Comment) (PT requesting for anxiety medication)  Provider response See new orders  Date of Provider Response 01/28/21  Time of Provider Response [phone]

## 2021-01-28 NOTE — Anesthesia Preprocedure Evaluation (Addendum)
Anesthesia Evaluation  Patient identified by MRN, date of birth, ID band Patient awake    Reviewed: Allergy & Precautions, NPO status , Patient's Chart, lab work & pertinent test results  History of Anesthesia Complications Negative for: history of anesthetic complications  Airway Mallampati: II  TM Distance: >3 FB Neck ROM: Full    Dental  (+) Dental Advisory Given, Teeth Intact   Pulmonary Current Smoker and Patient abstained from smoking.,    breath sounds clear to auscultation       Cardiovascular hypertension, (-) angina Rhythm:Regular Rate:Normal     Neuro/Psych  Headaches, PSYCHIATRIC DISORDERS Anxiety Depression Bipolar Disorder Pt very teary and emotional PTSDChronic back pain    GI/Hepatic negative GI ROS, (+)     substance abuse  marijuana use,   Endo/Other  negative endocrine ROS  Renal/GU negative Renal ROS     Musculoskeletal   Abdominal   Peds  Hematology negative hematology ROS (+)   Anesthesia Other Findings   Reproductive/Obstetrics                            Anesthesia Physical Anesthesia Plan  ASA: 3  Anesthesia Plan: General   Post-op Pain Management:    Induction: Intravenous  PONV Risk Score and Plan: 2 and Ondansetron, Dexamethasone and Treatment may vary due to age or medical condition  Airway Management Planned: Oral ETT and Video Laryngoscope Planned  Additional Equipment: None  Intra-op Plan:   Post-operative Plan: Extubation in OR  Informed Consent: I have reviewed the patients History and Physical, chart, labs and discussed the procedure including the risks, benefits and alternatives for the proposed anesthesia with the patient or authorized representative who has indicated his/her understanding and acceptance.     Dental advisory given  Plan Discussed with: CRNA and Surgeon  Anesthesia Plan Comments:        Anesthesia Quick  Evaluation

## 2021-01-28 NOTE — Op Note (Signed)
01/28/2021  10:45 AM  PATIENT:  Tina Russell  40 y.o. female With neck and upper extremity pain. 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 Lymon was taken to the operating room, intubated, and placed under general anesthesia without difficulty. She was positioned supine with her head in slight extension on a horseshoe headrest. The neck was prepped and draped in a sterile manner. I infiltrated 4 cc's 1/2%lidocaine/1:200,000 strength epinephrine into the planned incision starting from the midline to the medial border of the left sternocleidomastoid muscle. I opened the incision with a 10 blade and dissected sharply through soft tissue to the platysma. I dissected in the plane superior to the platysma both rostrally and caudally. I then opened the platysma in a horizontal fashion with Metzenbaum scissors, and dissected in the inferior plane rostrally and caudally. With both blunt and sharp technique I created an avascular corridor to the cervical spine. I placed a spinal needle(s) in the disc space at 6/7 . I then reflected the longus colli from C6 to C7 and placed self retaining retractors. We opened the disc space(s) at 6/7 with a 15 blade. We removed disc with curettes, Kerrison punches, and the drill. Using the drill I removed osteophytes and prepared for the decompression.  We decompressed the spinal canal and the C7 root(s) with the drill, Kerrison punches, and the curettes. We used the  microscope to aid in microdissection. We removed the posterior longitudinal ligament to fully expose and decompress the thecal sac. We exposed the roots laterally taking down the 6/7 uncovertebral joints. With the decompression complete I moved on to the arthrodesis. I used the drill to level the surfaces of C6, and 7. I removed soft tissue to prepare the disc space and the bony surfaces. I measured the space and placed a 71mm structural allograft into the disc space.  I then placed the anterior instrumentation. I placed 2 screws in each vertebral body through the plate. I locked the screws into place. Intraoperative xray showed the graft, plate, and screws to be in good position. I irrigated the wound, achieved hemostasis, and closed the wound in layers. I approximated the platysma, and the subcuticular plane with vicryl sutures. I used Dermabond for a sterile dressing.   PLAN OF CARE: Admit for overnight observation  PATIENT DISPOSITION:  PACU - hemodynamically stable.   Delay start of Pharmacological VTE agent (>24hrs) due to surgical blood loss or risk of bleeding:  yes

## 2021-01-28 NOTE — Anesthesia Postprocedure Evaluation (Signed)
Anesthesia Post Note  Patient: Tina Russell  Procedure(s) Performed: Cervical Six-Seven Anterior Cervical Decompression/Discectomy/Fusion (Spine Cervical)     Patient location during evaluation: PACU Anesthesia Type: General Level of consciousness: awake and alert, patient cooperative and oriented Pain control: pain is improving. Vital Signs Assessment: post-procedure vital signs reviewed and stable Respiratory status: spontaneous breathing, nonlabored ventilation and respiratory function stable Cardiovascular status: blood pressure returned to baseline and stable Postop Assessment: no apparent nausea or vomiting Anesthetic complications: no   No notable events documented.  Last Vitals:  Vitals:   01/28/21 1126 01/28/21 1141  BP: (!) 121/91 121/68  Pulse: 73 62  Resp: 13 13  Temp:    SpO2: 96% 93%    Last Pain:  Vitals:   01/28/21 1111  TempSrc:   PainSc: 10-Worst pain ever                 Donye Dauenhauer,E. Omni Dunsworth

## 2021-01-29 MED ORDER — OXYCODONE HCL 5 MG PO TABS
5.0000 mg | ORAL_TABLET | ORAL | Status: DC | PRN
Start: 2021-01-29 — End: 2021-01-31
  Administered 2021-01-29 – 2021-01-30 (×4): 5 mg via ORAL
  Filled 2021-01-29 (×4): qty 1

## 2021-01-29 MED ORDER — HYDROCODONE-ACETAMINOPHEN 5-325 MG PO TABS
1.0000 | ORAL_TABLET | ORAL | Status: DC | PRN
  Administered 2021-01-29 – 2021-01-30 (×5): 2 via ORAL
  Filled 2021-01-29 (×5): qty 2

## 2021-01-29 MED ORDER — METHOCARBAMOL 500 MG PO TABS: 500.0000 mg | ORAL_TABLET | Freq: Four times a day (QID) | ORAL | 0 refills | Status: DC

## 2021-01-29 MED ORDER — OXYCODONE-ACETAMINOPHEN 5-325 MG PO TABS: 1.0000 | ORAL_TABLET | ORAL | 0 refills | Status: DC | PRN

## 2021-01-29 NOTE — Discharge Instructions (Signed)
Discharge Instructions       Call MD for:  difficulty breathing, headache or visual disturbances   Complete by: As directed      Call MD for:  hives   Complete by: As directed      Call MD for:  persistant nausea and vomiting   Complete by: As directed      Call MD for:  redness, tenderness, or signs of infection (pain, swelling, redness, odor or green/yellow discharge around incision site)   Complete by: As directed      Call MD for:  severe uncontrolled pain   Complete by: As directed      Call MD for:  temperature >100.4   Complete by: As directed      Diet - low sodium heart healthy   Complete by: As directed      Driving Restrictions   Complete by: As directed      No driving for 2 weeks, no riding in the car for 1 week    Increase activity slowly   Complete by: As directed      Lifting restrictions   Complete by: As directed      No lifting more than 8 lbs    No wound care   Complete by: As directed

## 2021-01-29 NOTE — Progress Notes (Signed)
Very anxious about going home today.  Said she does not know how she will manage at home, especially since she has a young son with ADHD.  Said she worries about not having anti-anxiety medication at home to get her through.  She was reminded that, per her admission, she sees a psychiatrist who follows her and prescribes medications to help her anxiety.  She asked that I page the surgeon to get a script for valium or ativan.  I reminded her that she will need to follow up with her pyschiatrist since she is an active patient and is being followed.  I did, however, page Neuro Surgery for her and am waiting for a return call.

## 2021-01-29 NOTE — Progress Notes (Signed)
Paged NP for the second time because pt said her pain is not controlled with the pills and she wanted to know if she could have more anti-anxiety meds and IV pain meds, as she is more anxious because of her pain.  She was walking with the tech and said she felt weak and light-headed and pain was even worse today than it was yesterday.  She has been on and off the phone with family and significant other and said her household is very stressful and she cannot go home like that.  Said she would not get any rest at home.  I did remind her that, per MD and NP assessment, she is ready for discharge and a rowdy household is not a reason to stay in the hospital.  I asked if she could have them to leave and allow her to get some rest and sleep.  She then asked if she could have more IV pain meds.  I reminded her that she was discharged to go home and they usually do not want you to have IV pain meds going home, as we will not be able to tell if the po meds actually work.  She declined other pain interventions and said she would just wait for next dose of medication.  She did receive Lidoderm patch and placed on brace for comfort.  Said she does not know if she should be going home today or not.

## 2021-01-29 NOTE — Discharge Summary (Addendum)
Physician Discharge Summary  Patient ID: SERAYA JOBST MRN: [id number] DOB/AGE: 03-06-1981 40 y.o.  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 The patient tolerated the procedure well and was taken to the recovery room and then to the floor in stable condition. The hospital course was routine. There were no complications. The wound remained clean dry and intact. Pt had appropriate neck soreness. No complaints of arm pain or new N/T/W. The patient remained afebrile with stable vital signs, and tolerated a regular diet. The patient continued to increase activities, and pain was well controlled with oral pain medications.   Consults: None  Significant Diagnostic Studies:  Results for orders placed or performed during the hospital encounter of 01/27/21  Resp Panel by RT-PCR (Flu A&B, Covid) Nasopharyngeal Swab   Specimen: Nasopharyngeal Swab; Nasopharyngeal(NP) swabs in vial transport medium  Result Value Ref Range   SARS Coronavirus 2 by RT PCR NEGATIVE NEGATIVE   Influenza A by PCR NEGATIVE NEGATIVE   Influenza B by PCR NEGATIVE NEGATIVE  Surgical pcr screen   Specimen: Nasal Mucosa; Nasal Swab  Result Value Ref Range   MRSA, PCR NEGATIVE NEGATIVE   Staphylococcus aureus NEGATIVE NEGATIVE  CBC  Result Value Ref Range   WBC 7.5 4.0 - 10.5 K/uL   RBC 4.05 3.87 - 5.11 MIL/uL   Hemoglobin 12.4 12.0 - 15.0 g/dL   HCT 37.8 58.8 - 50.2 %   MCV 94.3 80.0 - 100.0 fL   MCH 30.6 26.0 - 34.0 pg   MCHC 32.5 30.0 - 36.0 g/dL   RDW 77.4 12.8 - 78.6 %   Platelets 208 150 - 400 K/uL   nRBC 0.0 0.0 - 0.2 %  Basic metabolic panel  Result Value Ref Range   Sodium 137 135 - 145 mmol/L   Potassium 4.2 3.5 - 5.1 mmol/L   Chloride 105 98 - 111 mmol/L    CO2 25 22 - 32 mmol/L   Glucose, Bld 93 70 - 99 mg/dL   BUN 9 6 - 20 mg/dL   Creatinine, Ser 7.67 0.44 - 1.00 mg/dL   Calcium 8.7 (L) 8.9 - 10.3 mg/dL   GFR, Estimated >20 >94 mL/min   Anion gap 7 5 - 15  HIV Antibody (routine testing w rflx)  Result Value Ref Range   HIV Screen 4th Generation wRfx Non Reactive Non Reactive  CBC WITH DIFFERENTIAL  Result Value Ref Range   WBC 7.5 4.0 - 10.5 K/uL   RBC 4.31 3.87 - 5.11 MIL/uL   Hemoglobin 13.4 12.0 - 15.0 g/dL   HCT 70.9 62.8 - 36.6 %   MCV 93.0 80.0 - 100.0 fL   MCH 31.1 26.0 - 34.0 pg   MCHC 33.4 30.0 - 36.0 g/dL   RDW 29.4 76.5 - 46.5 %   Platelets 217 150 - 400 K/uL   nRBC 0.0 0.0 - 0.2 %   Neutrophils Relative % 86 %   Neutro Abs 6.5 1.7 - 7.7 K/uL   Lymphocytes Relative 11 %   Lymphs Abs 0.8 0.7 - 4.0 K/uL   Monocytes Relative 2 %   Monocytes Absolute 0.1 0.1 - 1.0 K/uL   Eosinophils Relative 0 %   Eosinophils Absolute 0.0 0.0 - 0.5 K/uL   Basophils Relative 0 %  Basophils Absolute 0.0 0.0 - 0.1 K/uL   Immature Granulocytes 1 %   Abs Immature Granulocytes 0.04 0.00 - 0.07 K/uL  APTT  Result Value Ref Range   aPTT 27 24 - 36 seconds  Protime-INR  Result Value Ref Range   Prothrombin Time 13.0 11.4 - 15.2 seconds   INR 1.0 0.8 - 1.2  Urinalysis, Routine w reflex microscopic Urine, Clean Catch  Result Value Ref Range   Color, Urine YELLOW YELLOW   APPearance CLEAR CLEAR   Specific Gravity, Urine 1.012 1.005 - 1.030   pH 6.0 5.0 - 8.0   Glucose, UA NEGATIVE NEGATIVE mg/dL   Hgb urine dipstick NEGATIVE NEGATIVE   Bilirubin Urine NEGATIVE NEGATIVE   Ketones, ur 5 (A) NEGATIVE mg/dL   Protein, ur NEGATIVE NEGATIVE mg/dL   Nitrite NEGATIVE NEGATIVE   Leukocytes,Ua NEGATIVE NEGATIVE  Pregnancy, urine  Result Value Ref Range   Preg Test, Ur NEGATIVE NEGATIVE  Type and screen [facility]  Result Value Ref Range   ABO/RH(D) O POS    Antibody Screen POS    Sample Expiration       01/30/2021,2359 Performed at [facility], [address] [place], [place] [address]    Unit Number [id number]    Blood Component Type RED CELLS,LR    Unit division 00    Status of Unit ALLOCATED    Donor AG Type NEGATIVE FOR E ANTIGEN    Transfusion Status OK TO TRANSFUSE    Crossmatch Result COMPATIBLE    Unit Number [id number]    Blood Component Type RBC LR PHER1    Unit division 00    Status of Unit ALLOCATED    Donor AG Type NEGATIVE FOR E ANTIGEN    Transfusion Status OK TO TRANSFUSE    Crossmatch Result COMPATIBLE   BPAM RBC  Result Value Ref Range   Blood Product Unit Number [id number]    PRODUCT CODE G3151V61    Unit Type and Rh 5100    Blood Product Expiration Date 607371062694    Blood Product Unit Number [id number]    PRODUCT CODE G1829H37    Unit Type and Rh 5100    Blood Product Expiration Date 169678938101     DG Cervical Spine 2 or 3 views  Result Date: 01/28/2021 CLINICAL DATA:  ACDF C6-7 EXAM: CERVICAL SPINE - 2-3 VIEW COMPARISON:  None. FINDINGS: First cross-table lateral view demonstrates anterior localizing instrument at C6-7. Second lateral intraoperative view demonstrates anterior plate at B5-1. Normal alignment. No complicating feature. IMPRESSION: ACDF C6-7.  No visible complicating feature. Electronically Signed   By: Charlett Nose M.D.   On: 01/28/2021 12:11    Antibiotics:  Anti-infectives (From admission, onward)    Start     Dose/Rate Route Frequency Ordered Stop   01/28/21 0830  vancomycin (VANCOCIN) IVPB 1000 mg/200 mL premix        1,000 mg 200 mL/hr over 60 Minutes Intravenous On call to O.R. 01/28/21 0741 01/28/21 0930       Discharge Exam: Blood pressure 123/77, pulse 75, temperature 98.3 F (36.8 C), temperature source Oral, resp. rate 18, height 5\' 4"  (1.626 m), weight 68 kg, last menstrual period 01/14/2021, SpO2 100 %. Neurologic: Grossly normal Ambulating and voiding well, incision cdi   Discharge  Medications:   Allergies as of 01/29/2021       Reactions   Amoxicillin-pot Clavulanate Rash   Strattera [atomoxetine Hcl] Hypertension        Medication  List     STOP taking these medications    diclofenac 50 MG tablet Commonly known as: CATAFLAM       TAKE these medications    cloNIDine 0.1 MG tablet Commonly known as: CATAPRES Take 0.1 mg by mouth 2 (two) times daily.   lamoTRIgine 200 MG tablet Commonly known as: LAMICTAL Take 200 mg by mouth at bedtime.   levonorgestrel 20 MCG/24HR IUD Commonly known as: MIRENA by Intrauterine route.   methocarbamol 500 MG tablet Commonly known as: Robaxin Take 1 tablet (500 mg total) by mouth 4 (four) times daily.   oxyCODONE-acetaminophen 5-325 MG tablet Commonly known as: Percocet Take 1 tablet by mouth every 4 (four) hours as needed for severe pain.   QUEtiapine 25 MG tablet Commonly known as: SEROQUEL Take 75-100 mg by mouth at bedtime.        Disposition: home   Final Dx: acdf C6-7  Discharge Instructions     Call MD for:  difficulty breathing, headache or visual disturbances   Complete by: As directed    Call MD for:  hives   Complete by: As directed    Call MD for:  persistant nausea and vomiting   Complete by: As directed    Call MD for:  redness, tenderness, or signs of infection (pain, swelling, redness, odor or green/yellow discharge around incision site)   Complete by: As directed    Call MD for:  severe uncontrolled pain   Complete by: As directed    Call MD for:  temperature >100.4   Complete by: As directed    Diet - low sodium heart healthy   Complete by: As directed    Driving Restrictions   Complete by: As directed    No driving for 2 weeks, no riding in the car for 1 week   Increase activity slowly   Complete by: As directed    Lifting restrictions   Complete by: As directed    No lifting more than 8 lbs   No wound care   Complete by: As directed            Signed: Tiana Loft Meyran 01/29/2021, 9:32 AM

## 2021-01-29 NOTE — Progress Notes (Signed)
NP Leo Grosser did return call and explained that they are not able to prescribe anxiety meds for her and she would need to follow up with her psychiatrist.

## 2021-01-29 NOTE — Plan of Care (Signed)
  Problem: Education: Goal: Knowledge of General Education information will improve Description: Including pain rating scale, medication(s)/side effects and non-pharmacologic comfort measures Outcome: Progressing   Problem: Health Behavior/Discharge Planning: Goal: Ability to manage health-related needs will improve Outcome: Progressing   Problem: Clinical Measurements: Goal: Ability to maintain clinical measurements within normal limits will improve Outcome: Progressing Goal: Will remain free from infection Outcome: Progressing Goal: Diagnostic test results will improve Outcome: Progressing Goal: Respiratory complications will improve Outcome: Progressing   Problem: Activity: Goal: Risk for activity intolerance will decrease Outcome: Progressing   Problem: Coping: Goal: Level of anxiety will decrease Outcome: Progressing   Problem: Pain Managment: Goal: General experience of comfort will improve Outcome: Progressing   Problem: Safety: Goal: Ability to remain free from injury will improve Outcome: Progressing   Problem: Skin Integrity: Goal: Risk for impaired skin integrity will decrease Outcome: Progressing   Problem: Education: Goal: Ability to verbalize activity precautions or restrictions will improve Outcome: Progressing Goal: Knowledge of the prescribed therapeutic regimen will improve Outcome: Progressing Goal: Understanding of discharge needs will improve Outcome: Progressing   Problem: Activity: Goal: Ability to avoid complications of mobility impairment will improve Outcome: Progressing Goal: Ability to tolerate increased activity will improve Outcome: Progressing Goal: Will remain free from falls Outcome: Progressing   Problem: Bowel/Gastric: Goal: Gastrointestinal status for postoperative course will improve Outcome: Progressing   Problem: Clinical Measurements: Goal: Ability to maintain clinical measurements within normal limits will  improve Outcome: Progressing Goal: Postoperative complications will be avoided or minimized Outcome: Progressing Goal: Diagnostic test results will improve Outcome: Progressing   Problem: Pain Management: Goal: Pain level will decrease Outcome: Progressing   Problem: Skin Integrity: Goal: Will show signs of wound healing Outcome: Progressing   Problem: Health Behavior/Discharge Planning: Goal: Identification of resources available to assist in meeting health care needs will improve Outcome: Progressing   Problem: Bladder/Genitourinary: Goal: Urinary functional status for postoperative course will improve Outcome: Progressing   

## 2021-01-29 NOTE — Progress Notes (Signed)
NEUROSURGERY PROGRESS NOTE Postop day 1 acdf Doing well. Complains of appropriate neck soreness. No arm pain No numbness, tingling or weakness Ambulating and voiding well Good strength and sensation Incision CDI  Temp:  [97.6 F (36.4 C)-98.4 F (36.9 C)] 98.3 F (36.8 C) (08/21 0757) Pulse Rate:  [56-106] 75 (08/21 0757) Resp:  [13-23] 18 (08/21 0757) BP: (118-167)/(68-102) 123/77 (08/21 0757) SpO2:  [93 %-100 %] 100 % (08/21 0757)  Plan: Ambulate in the hallway today. Control pain with oral medications and avoid IV. Ok to discharge home this afternoon if she is walking well and pain is under control.   Sherryl Manges, NP 01/29/2021 9:33 AM

## 2021-01-29 NOTE — Plan of Care (Signed)
  Problem: Education: Goal: Ability to verbalize activity precautions or restrictions will improve Outcome: Progressing Goal: Knowledge of the prescribed therapeutic regimen will improve Outcome: Progressing Goal: Understanding of discharge needs will improve Outcome: Progressing   Problem: Activity: Goal: Ability to avoid complications of mobility impairment will improve Outcome: Progressing Goal: Ability to tolerate increased activity will improve Outcome: Progressing Goal: Will remain free from falls Outcome: Progressing   Problem: Bowel/Gastric: Goal: Gastrointestinal status for postoperative course will improve Outcome: Progressing   Problem: Clinical Measurements: Goal: Ability to maintain clinical measurements within normal limits will improve Outcome: Progressing Goal: Postoperative complications will be avoided or minimized Outcome: Progressing

## 2021-01-30 ENCOUNTER — Inpatient Hospital Stay: Payer: Medicaid Other | Admitting: Hematology and Oncology

## 2021-01-30 ENCOUNTER — Encounter (HOSPITAL_COMMUNITY): Payer: Self-pay | Admitting: Neurosurgery

## 2021-01-30 LAB — TYPE AND SCREEN
ABO/RH(D): O POS
Antibody Screen: POSITIVE
PT AG Type: NEGATIVE

## 2021-01-30 MED ORDER — OXYCODONE-ACETAMINOPHEN 5-325 MG PO TABS: 1.0000 | ORAL_TABLET | ORAL | 0 refills | Status: DC | PRN

## 2021-01-30 NOTE — Plan of Care (Signed)

## 2021-01-30 NOTE — Plan of Care (Signed)
Problem: Education: Goal: Knowledge of General Education information will improve Description: Including pain rating scale, medication(s)/side effects and non-pharmacologic comfort measures 01/30/2021 1900 by Thom Chimes, RN Outcome: Adequate for Discharge 01/30/2021 [phone] by Thom Chimes, RN Outcome: Progressing   Problem: Health Behavior/Discharge Planning: Goal: Ability to manage health-related needs will improve 01/30/2021 1900 by Thom Chimes, RN Outcome: Adequate for Discharge 01/30/2021 [phone] by Thom Chimes, RN Outcome: Progressing   Problem: Clinical Measurements: Goal: Ability to maintain clinical measurements within normal limits will improve 01/30/2021 1900 by Thom Chimes, RN Outcome: Adequate for Discharge 01/30/2021 0932 by Thom Chimes, RN Outcome: Progressing Goal: Will remain free from infection 01/30/2021 1900 by Thom Chimes, RN Outcome: Adequate for Discharge 01/30/2021 [phone] by Thom Chimes, RN Outcome: Progressing Goal: Diagnostic test results will improve 01/30/2021 1900 by Thom Chimes, RN Outcome: Adequate for Discharge 01/30/2021 3220 by Thom Chimes, RN Outcome: Progressing Goal: Respiratory complications will improve 01/30/2021 1900 by Thom Chimes, RN Outcome: Adequate for Discharge 01/30/2021 0948 by Thom Chimes, RN Outcome: Progressing   Problem: Activity: Goal: Risk for activity intolerance will decrease 01/30/2021 1900 by Thom Chimes, RN Outcome: Adequate for Discharge 01/30/2021 [phone] by Thom Chimes, RN Outcome: Progressing   Problem: Coping: Goal: Level of anxiety will decrease 01/30/2021 1900 by Thom Chimes, RN Outcome: Adequate for Discharge 01/30/[phone] by Thom Chimes, RN Outcome: Progressing   Problem: Pain Managment: Goal: General experience of comfort will improve 01/30/2021 1900 by Thom Chimes, RN Outcome: Adequate for Discharge 01/30/2021 3762 by Thom Chimes, RN Outcome: Progressing   Problem: Safety: Goal: Ability to remain free from injury will improve 01/30/2021 1900 by Thom Chimes, RN Outcome: Adequate for Discharge 01/30/2021 8315 by Thom Chimes, RN Outcome: Progressing   Problem: Skin Integrity: Goal: Risk for impaired skin integrity will decrease 01/30/2021 1900 by Thom Chimes, RN Outcome: Adequate for Discharge 01/30/2021 1761 by Thom Chimes, RN Outcome: Progressing   Problem: Education: Goal: Ability to verbalize activity precautions or restrictions will improve 01/30/2021 1900 by Thom Chimes, RN Outcome: Adequate for Discharge 01/30/2021 [phone] by Thom Chimes, RN Outcome: Progressing Goal: Knowledge of the prescribed therapeutic regimen will improve 01/30/2021 1900 by Thom Chimes, RN Outcome: Adequate for Discharge 01/30/2021 7106 by Thom Chimes, RN Outcome: Progressing Goal: Understanding of discharge needs will improve 01/30/2021 1900 by Thom Chimes, RN Outcome: Adequate for Discharge 01/30/2021 0948 by Thom Chimes, RN Outcome: Progressing   Problem: Activity: Goal: Ability to avoid complications of mobility impairment will improve 01/30/2021 1900 by Thom Chimes, RN Outcome: Adequate for Discharge 01/30/2021 [phone] by Thom Chimes, RN Outcome: Progressing Goal: Ability to tolerate increased activity will improve 01/30/2021 1900 by Thom Chimes, RN Outcome: Adequate for Discharge 01/30/2021 8546 by Thom Chimes, RN Outcome: Progressing Goal: Will remain free from falls 01/30/2021 1900 by Thom Chimes, RN Outcome: Adequate for Discharge 01/30/2021 0948 by Thom Chimes, RN Outcome: Progressing   Problem: Bowel/Gastric: Goal: Gastrointestinal status for postoperative course will improve 01/30/2021 1900 by Thom Chimes, RN Outcome: Adequate for Discharge 01/30/2021 [phone] by Thom Chimes, RN Outcome: Progressing   Problem: Clinical  Measurements: Goal: Ability to maintain clinical measurements within normal limits will improve 01/30/2021 1900 by Thom Chimes, RN Outcome: Adequate for Discharge 01/30/2021 5009 by Thom Chimes, RN Outcome: Progressing Goal: Postoperative complications will be  avoided or minimized 01/30/2021 1900 by Thom Chimes, RN Outcome: Adequate for Discharge 01/30/2021 [phone] by Thom Chimes, RN Outcome: Progressing Goal: Diagnostic test results will improve 01/30/2021 1900 by Thom Chimes, RN Outcome: Adequate for Discharge 01/30/2021 [phone] by Thom Chimes, RN Outcome: Progressing   Problem: Pain Management: Goal: Pain level will decrease 01/30/2021 1900 by Thom Chimes, RN Outcome: Adequate for Discharge 01/30/2021 0932 by Thom Chimes, RN Outcome: Progressing   Problem: Skin Integrity: Goal: Will show signs of wound healing 01/30/2021 1900 by Thom Chimes, RN Outcome: Adequate for Discharge 01/30/2021 0948 by Thom Chimes, RN Outcome: Progressing   Problem: Health Behavior/Discharge Planning: Goal: Identification of resources available to assist in meeting health care needs will improve 01/30/2021 1900 by Thom Chimes, RN Outcome: Adequate for Discharge 01/30/2021 6712 by Thom Chimes, RN Outcome: Progressing   Problem: Bladder/Genitourinary: Goal: Urinary functional status for postoperative course will improve 01/30/2021 1900 by Thom Chimes, RN Outcome: Adequate for Discharge 01/30/2021 0948 by Thom Chimes, RN Outcome: Progressing

## 2021-01-30 NOTE — TOC Transition Note (Signed)
Transition of Care [facility]) - CM/SW Discharge Note   Patient Details  Name: Tina Russell MRN: [id number] Date of Birth: 12/02/1980  [facility] CM/SW Contact:  Kermit Balo, RN Phone Number: 01/30/2021, 1:59 PM   Clinical Narrative:    Patient is from home with her significant other, son and step daughter (staying a day or 2). She asked to see CM about resources for  assistance paying bills at home during her time out of work. CM provided her with the crisis/ emergency list of agencies/ groups that assist with bills and covering rent.  Pt unhappy with her current PCP and asking for assistance in getting a new one. CM was able to get an appt at Primary care at [facility] and information on the AVS. Pt has transportation home when medically ready.   Final next level of care: Home/Self Care Barriers to Discharge: No Barriers Identified   Patient Goals and CMS Choice        Discharge Placement                       Discharge Plan and Services                                     Social Determinants of Health (SDOH) Interventions     Readmission Risk Interventions No flowsheet data found.

## 2021-01-30 NOTE — Progress Notes (Signed)
Discharged to home after IV access removed and discharge instructions reviewed with pt and signif other.  Dr Franky Macho came up to review discharge instructions with and answer all questions.

## 2021-01-30 NOTE — Plan of Care (Signed)
  Problem: Education: Goal: Knowledge of General Education information will improve Description: Including pain rating scale, medication(s)/side effects and non-pharmacologic comfort measures Outcome: Progressing   Problem: Health Behavior/Discharge Planning: Goal: Ability to manage health-related needs will improve Outcome: Progressing   Problem: Clinical Measurements: Goal: Ability to maintain clinical measurements within normal limits will improve Outcome: Progressing Goal: Will remain free from infection Outcome: Progressing Goal: Diagnostic test results will improve Outcome: Progressing Goal: Respiratory complications will improve Outcome: Progressing   Problem: Activity: Goal: Risk for activity intolerance will decrease Outcome: Progressing   Problem: Coping: Goal: Level of anxiety will decrease Outcome: Progressing   Problem: Pain Managment: Goal: General experience of comfort will improve Outcome: Progressing   Problem: Safety: Goal: Ability to remain free from injury will improve Outcome: Progressing   Problem: Skin Integrity: Goal: Risk for impaired skin integrity will decrease Outcome: Progressing   Problem: Education: Goal: Ability to verbalize activity precautions or restrictions will improve Outcome: Progressing Goal: Knowledge of the prescribed therapeutic regimen will improve Outcome: Progressing Goal: Understanding of discharge needs will improve Outcome: Progressing   Problem: Activity: Goal: Ability to avoid complications of mobility impairment will improve Outcome: Progressing Goal: Ability to tolerate increased activity will improve Outcome: Progressing Goal: Will remain free from falls Outcome: Progressing   Problem: Bowel/Gastric: Goal: Gastrointestinal status for postoperative course will improve Outcome: Progressing   Problem: Clinical Measurements: Goal: Ability to maintain clinical measurements within normal limits will  improve Outcome: Progressing Goal: Postoperative complications will be avoided or minimized Outcome: Progressing Goal: Diagnostic test results will improve Outcome: Progressing   Problem: Pain Management: Goal: Pain level will decrease Outcome: Progressing   Problem: Skin Integrity: Goal: Will show signs of wound healing Outcome: Progressing   Problem: Health Behavior/Discharge Planning: Goal: Identification of resources available to assist in meeting health care needs will improve Outcome: Progressing   Problem: Bladder/Genitourinary: Goal: Urinary functional status for postoperative course will improve Outcome: Progressing

## 2021-01-30 NOTE — Progress Notes (Addendum)
I did call back to NP Benita Gutter to inform him that she is still saying she is getting little relief from the pain meds and does not feel comfortable leaving.  Day shift NP Selena Batten, who I had corresponded with throughout the day about her, said to trying anther dose of the po pain med when it was due and if that does not relieve her pain then she would just have her stay and be discharged tomorrow.  When I called back, Selena Batten was not available to Ivin Booty was.  He was informed of what was going on and that the current med regimen was not relieving her pain the way she thought it was going to and it was making her very nervous, tense, and stressed.  He said to DC IVF and IV pain meds and he added Norco to the pain med  prn regimen and said to cancel her DC for today and she would just have to be discharged tomorrow.  Pt and significant other was notified and she was very happy about that.

## 2021-01-31 LAB — BPAM RBC
Blood Product Expiration Date: 202209142359
Blood Product Expiration Date: 202209152359
Unit Type and Rh: 5100
Unit Type and Rh: 5100

## 2021-01-31 LAB — TYPE AND SCREEN
ABO/RH(D): O POS
Antibody Screen: POSITIVE
Donor AG Type: NEGATIVE
Donor AG Type: NEGATIVE
Unit division: 0
Unit division: 0

## 2021-02-16 ENCOUNTER — Encounter: Payer: Self-pay | Admitting: Physical Therapy

## 2021-02-16 ENCOUNTER — Other Ambulatory Visit: Payer: Self-pay

## 2021-02-16 ENCOUNTER — Ambulatory Visit: Payer: Medicaid Other | Attending: Neurosurgery | Admitting: Physical Therapy

## 2021-02-16 DIAGNOSIS — M6289 Other specified disorders of muscle: Secondary | ICD-10-CM

## 2021-02-16 DIAGNOSIS — M542 Cervicalgia: Secondary | ICD-10-CM

## 2021-02-16 DIAGNOSIS — M545 Low back pain, unspecified: Secondary | ICD-10-CM | POA: Diagnosis present

## 2021-02-16 DIAGNOSIS — M6281 Muscle weakness (generalized): Secondary | ICD-10-CM | POA: Diagnosis present

## 2021-02-16 NOTE — Patient Instructions (Signed)
Access Code: [email or web address] URL: [email or web address] Date: 02/16/2021 Prepared by: Alphonzo Severance  Exercises Seated Scapular Retraction - 2-3 x daily - 7 x weekly - 2 sets - 10 reps - 5 hold Seated Cervical Retraction - 2-3 x daily - 7 x weekly - 10 reps - 5 hold

## 2021-02-16 NOTE — Therapy (Signed)
[facility] [address] [place], [place], [address] Phone: [phone]   Fax:  [phone]  Physical Therapy Evaluation  Patient Details  Name: Tina Russell MRN: [id number] Date of Birth: Nov 04, 1980 Referring Provider (PT): Coletta Memos, MD  Encounter Date: 02/16/2021   PT End of Session - 02/16/21 1343     Visit Number 1    Number of Visits 10    Date for PT Re-Evaluation 04/27/21    Authorization Type [employer]    Authorization - Visit Number 1    PT Start Time 0920    PT Stop Time 1000    PT Time Calculation (min) 40 min             Past Medical History:  Diagnosis Date   ADHD    Anxiety    Bipolar disorder (HCC)    Cervical spinal stenosis 12/27/2020   C6-7 level   Chronic back pain    Common migraine with intractable migraine 12/27/2020   Depression    Family history of breast cancer    Family history of melanoma    Hypertension    Kidney failure    Migraine    Neuropathy    PTSD (post-traumatic stress disorder)     Past Surgical History:  Procedure Laterality Date   ANTERIOR CERVICAL DECOMP/DISCECTOMY FUSION N/A 01/28/2021   Procedure: Cervical Six-Seven Anterior Cervical Decompression/Discectomy/Fusion;  Surgeon: Coletta Memos, MD;  Location: [facility] OR;  Service: Neurosurgery;  Laterality: N/A;   DILATION AND CURETTAGE OF UTERUS     3 blood tranfusions    There were no vitals filed for this visit.    Subjective Assessment - 02/16/21 1353     Subjective LIADAN Russell is a 40 y.o. female who presents to clinic with chief complaint of stiffness and pain following anterior cervical decompressionand disk replacement c6/7 - 8/20.  MOI/History of condition: Pt has been dealing with L UE and periscapular pain for ~3 years.  Found to be a bulging disk.  Her MD told her that she could ease back into gentle movement and wean from brace.  Pain location: between shoulder shoulder blades, some shooting into L UE.  Red  flags: none.  48 hour pain intensity:  highest 10/10, current 8/10, best 5/10.  Aggs: prolonged positions (20-30 min).  Eases: heat, gentle movement.  Nature: shooting, piercing.  Severity: high.  Irritability: mod.  Stage: acute/subacute.  Stability: getting better.  24 hour pattern: no clear pattern.  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  Hobbies: taekwando.  Functional limitations/goals: recreation, laundry, getting dressed.  Home environment: lives with SO and son (12), no steps.  Assistive device: none.   Hand dominance: R.  Falls: Yes, no fear.    Patient is accompained by: Family member    Pertinent History Significant PMH:  bipolar, PTSD, ADHD                [facility] PT Assessment - 02/16/21 0001       Assessment   Medical Diagnosis Referral diagnosis: Other cervical disc displacement, unspecified cervical region (M50.20)    Referring Provider (PT) Coletta Memos, MD    Onset Date/Surgical Date 01/28/21    Hand Dominance Right    Next MD Visit 9/20    Prior Therapy yes, for shoulder pain      Precautions   Precaution Comments Significant PMH:  bipolar, PTSD, ADHD      Restrictions   Weight Bearing Restrictions Yes    Other  Position/Activity Restrictions anterior cervical decompressionand disk replacement c6/7 - 8/20      Balance Screen   Has the patient fallen in the past 6 months Yes    How many times? 1    Has the patient had a decrease in activity level because of a fear of falling?  No    Is the patient reluctant to leave their home because of a fear of falling?  No      Prior Function   Level of Independence Independent      Functional Tests   Functional tests Other      Other:   Other/ Comments Grip strength (kg): L: 25, R: 23      ROM / Strength   AROM / PROM / Strength AROM      AROM   AROM Assessment Site Cervical    Cervical Flexion 15 P!    Cervical Extension 10 P!    Cervical - Right Side Bend 15 P!    Cervical - Left Side Bend 10 P!     Cervical - Right Rotation 15 P!    Cervical - Left Rotation 30 P!      Palpation   Palpation comment incision appears well healed                        Objective measurements completed on examination: See above findings.                PT Education - 02/16/21 1347     Education Details POC, diagnosis, prognosis, HEP.  Pt educated via explanation, demonstration, and handout (HEP).  Pt confirms understanding verbally.              PT Short Term Goals - 02/16/21 1335       PT SHORT TERM GOAL #1   Title XANDRA LARAMEE will be >75% HEP compliant throughout therapy to improve carryover between sessions and facilitate independent management of condition    Baseline no HEP    Status New    Target Date 03/09/21               PT Long Term Goals - 02/16/21 1335       PT LONG TERM GOAL #1   Title Tina Russell will achieve 28 kg grip strength in bil UE  EVAL: L 25 kg, R 23 kg  target date: 04/27/2021    Baseline EVAL: L 25 kg, R 23 kg    Target Date 04/27/21      PT LONG TERM GOAL #2   Title ROSANNE Russell will improve cervical rotation to >/= 45 degrees  EVAL: L: 30, R 15 degrees  target date: 04/27/2021    Baseline EVAL: L: 30, R 15 degrees    Target Date 04/27/21      PT LONG TERM GOAL #3   Title Tina Russell will improve the following MMTs to >/= 4/5 to show improvement in strength:  shoulder ER and flexion  EVAL: unable to test  target date: 04/27/2021    Baseline unable to test    Target Date 04/27/21      PT LONG TERM GOAL #4   Title Tina Russell will report >/= 70% decrease in pain from evaluation  EVAL: 10/10 max pain  target date: 04/27/2021    Baseline 10/10 pain    Target Date 04/27/21      PT LONG TERM GOAL #5  Title Tina Russell will improve NDI score to 20 (40% disability) as a proxy for functional improvement  EVAL: 40 (80% disability)  target date: 04/27/2021    Baseline 20 (40% disability)    Target Date  04/27/21                    Plan - 02/16/21 1346     Clinical Impression Statement [name] is a 40 y.o. female who presents to clinic with signs and sxs consistent with neck and periscapular pain secondary to anterior cervical decompression and disk replacement of C6-7 on 8/20.  Pt presents with pain and impairments/deficits in: UE strength, cervical strength, periscapular strength, cervical ROM.  Activity limitations include: cervical rotation while driving, sitting in prolonged positions, writing.  Participation limitations include: taekwando, writing and reading for work.  Pt will benefit from skilled therapy to address pain and the listed deficits in order to achieve functional goals, enable safety and independence in completion of daily tasks, and return to PLOF.    Stability/Clinical Decision Making Stable/Uncomplicated    Clinical Decision Making Low    Rehab Potential Good    PT Frequency 2x / week    PT Duration Other (comment)   10   PT Treatment/Interventions ADLs/Self Care Home Management;Aquatic Therapy;Iontophoresis 4mg /ml Dexamethasone;Moist Heat;Gait training;Therapeutic activities;Therapeutic exercise;Neuromuscular re-education;Balance training;Dry needling    PT Next Visit Plan Gentle AROM, periscapular progression, cervical progression as appropriate    PT Home Exercise Plan [id number]    Consulted and Agree with Plan of Care Patient             Patient will benefit from skilled therapeutic intervention in order to improve the following deficits and impairments:  Pain, Impaired UE functional use, Decreased strength, Decreased range of motion  Visit Diagnosis: Cervicalgia  Low back pain, unspecified back pain laterality, unspecified chronicity, unspecified whether sciatica present  Muscle weakness  Muscle tightness     Problem List Patient Active Problem List   Diagnosis Date Noted   Cervical spondylosis with radiculopathy 01/27/2021   Genetic  testing 01/10/2021   Family history of breast cancer 12/28/2020   Family history of melanoma 12/28/2020   Cervical spinal stenosis 12/27/2020   Common migraine with intractable migraine 12/27/2020    12/29/2020, PT 02/16/2021, 1:56 PM  [facility] [address] [place], [place], [place] Phone: [phone]   Fax:  [phone]  Name: HERTHA GERGEN MRN: [name] Date of Birth: Oct 11, 1980

## 2021-02-17 ENCOUNTER — Emergency Department (HOSPITAL_COMMUNITY)
Admission: EM | Admit: 2021-02-17 | Discharge: 2021-02-18 | Disposition: A | Discharge status: Home / Self Care | Payer: Medicaid Other | Attending: Emergency Medicine | Admitting: Emergency Medicine

## 2021-02-17 ENCOUNTER — Encounter (HOSPITAL_COMMUNITY): Payer: Self-pay

## 2021-02-17 ENCOUNTER — Other Ambulatory Visit: Payer: Self-pay

## 2021-02-17 DIAGNOSIS — F419 Anxiety disorder, unspecified: Secondary | ICD-10-CM | POA: Diagnosis not present

## 2021-02-17 DIAGNOSIS — F17290 Nicotine dependence, other tobacco product, uncomplicated: Secondary | ICD-10-CM | POA: Insufficient documentation

## 2021-02-17 DIAGNOSIS — R202 Paresthesia of skin: Secondary | ICD-10-CM | POA: Insufficient documentation

## 2021-02-17 DIAGNOSIS — M542 Cervicalgia: Secondary | ICD-10-CM | POA: Diagnosis not present

## 2021-02-17 DIAGNOSIS — I10 Essential (primary) hypertension: Secondary | ICD-10-CM | POA: Diagnosis not present

## 2021-02-17 DIAGNOSIS — Z79899 Other long term (current) drug therapy: Secondary | ICD-10-CM | POA: Diagnosis not present

## 2021-02-17 LAB — URINALYSIS, ROUTINE W REFLEX MICROSCOPIC
Bilirubin Urine: NEGATIVE
Glucose, UA: NEGATIVE mg/dL
Hgb urine dipstick: NEGATIVE
Ketones, ur: NEGATIVE mg/dL
Leukocytes,Ua: NEGATIVE
Nitrite: NEGATIVE
Protein, ur: NEGATIVE mg/dL
Specific Gravity, Urine: <1.005 — ABNORMAL LOW (ref 1.005–1.030)
pH: 7 (ref 5.0–8.0)

## 2021-02-17 NOTE — ED Triage Notes (Signed)
Pt c/o cervical spine pain 8/20 sx, states pain shoots into head & lower back, BLE. Endorses tremor, shaking in arms, states pain mgmt is not appropriate under current MD- Cabbell  10/10 shooting, sharp

## 2021-02-18 MED ORDER — OXYCODONE-ACETAMINOPHEN 5-325 MG PO TABS
2.0000 | ORAL_TABLET | Freq: Once | ORAL | Status: AC
  Administered 2021-02-18: 2 via ORAL
  Filled 2021-02-18: qty 2

## 2021-02-18 MED ORDER — LIDOCAINE 5 % EX PTCH
1.0000 | MEDICATED_PATCH | CUTANEOUS | Status: DC
  Administered 2021-02-18: 1 via TRANSDERMAL
  Filled 2021-02-18: qty 1

## 2021-02-18 MED ORDER — METHOCARBAMOL 500 MG PO TABS
500.0000 mg | ORAL_TABLET | Freq: Once | ORAL | Status: AC
  Administered 2021-02-18: 500 mg via ORAL
  Filled 2021-02-18: qty 1

## 2021-02-18 NOTE — ED Provider Notes (Signed)
[facility]   CSN: [id number] Arrival date & time: 02/17/21  1715     History Chief Complaint  Patient presents with   Neck Pain    Tina ColonelKaren F Cowsert is a 40 y.o. female.   Neck Pain Associated symptoms: no chest pain, no fever, no headaches and no weakness   Patient presents for pain that is located between her shoulder blades and currently radiating to her right scapular area.  She had a recent admission 3 weeks ago for ACDF of C6-7 with Dr. Franky Machoabbell.  She reports that her current pain has been present and persistent since her surgery.  She denies any acute worsening.  Prior to her surgery, she had similar pain that was more towards the left scapula.  Since her surgery, she has had a follow-up appoint with neurosurgery.  This was last week.  At that time, postoperative assessment was reassuring.  Patient has been treating her pain at home with oxycodone and tizanidine.  She has been avoiding NSAIDs due to kidney injury that occurred years ago.  She has been increasing her activity.  She has started with physical therapy.  She reports that she has a history of anxiety and ADHD.  Given the ongoing postoperative pain, she has had a worsening of her anxiety.  Patient is concerned that the severity and duration of the pain might be indicative of postoperative complication.  In addition to the pain, she does endorse paresthesias in her bilateral ulnar distributions.  This has been chronic since before her surgery.  She denies any other symptoms.    Past Medical History:  Diagnosis Date   ADHD    Anxiety    Bipolar disorder (HCC)    Cervical spinal stenosis 12/27/2020   C6-7 level   Chronic back pain    Common migraine with intractable migraine 12/27/2020   Depression    Family history of breast cancer    Family history of melanoma    Hypertension    Kidney failure    Migraine    Neuropathy    PTSD (post-traumatic stress disorder)      Patient Active Problem List   Diagnosis Date Noted   Cervical spondylosis with radiculopathy 01/27/2021   Genetic testing 01/10/2021   Family history of breast cancer 12/28/2020   Family history of melanoma 12/28/2020   Cervical spinal stenosis 12/27/2020   Common migraine with intractable migraine 12/27/2020    Past Surgical History:  Procedure Laterality Date   ANTERIOR CERVICAL DECOMP/DISCECTOMY FUSION N/A 01/28/2021   Procedure: Cervical Six-Seven Anterior Cervical Decompression/Discectomy/Fusion;  Surgeon: Coletta Memosabbell, Kyle, MD;  [facility];  Service: Neurosurgery;  Laterality: N/A;   DILATION AND CURETTAGE OF UTERUS     3 blood tranfusions     OB History   No obstetric history on file.     Family History  Problem Relation Age of Onset   Alcohol abuse Mother    Hypertension Mother    Hyperlipidemia Mother    Skin cancer Mother    Hypertension Father    Hyperlipidemia Father    Alcohol abuse Father    Alcohol abuse Brother    Hypertension Brother    Breast cancer Maternal Aunt        Neg GT in 2017   Melanoma Maternal Aunt    Hypertension Maternal Grandmother    Breast cancer Maternal Grandmother    Throat cancer Paternal Grandmother    Hypertension Paternal Grandfather    Breast  cancer Other        Mother's 3 maternal cousins   Breast cancer Other    Melanoma Other        mother's maternal female first cousin    Social History   Tobacco Use   Smoking status: Every Day    Types: E-cigarettes   Smokeless tobacco: Never  Vaping Use   Vaping Use: Every day  Substance Use Topics   Alcohol use: Not Currently   Drug use: Yes    Types: Marijuana    Comment: to help with pain    Home Medications Prior to Admission medications   Medication Sig Start Date End Date Taking? Authorizing Provider  cloNIDine (CATAPRES) 0.1 MG tablet Take 0.1 mg by mouth 2 (two) times daily. 11/15/20   [provider]  lamoTRIgine (LAMICTAL) 200 MG tablet Take 200 mg  by mouth at bedtime. 11/29/20   [provider]  levonorgestrel (MIRENA) 20 MCG/24HR IUD by Intrauterine route.    [provider]  methocarbamol (ROBAXIN) 500 MG tablet Take 1 tablet (500 mg total) by mouth 4 (four) times daily. 01/29/21   Meyran, Tiana Loft, NP  oxyCODONE-acetaminophen (PERCOCET) 5-325 MG tablet Take 1 tablet by mouth every 4 (four) hours as needed for severe pain. Patient not taking: Reported on 02/16/2021 01/30/21 01/30/22  Jadene Pierini, MD  QUEtiapine (SEROQUEL) 25 MG tablet Take 75-100 mg by mouth at bedtime. 07/25/20   [provider]    Allergies    Amoxicillin-pot clavulanate and Strattera [atomoxetine hcl]  Review of Systems   Review of Systems  Constitutional:  Negative for activity change, appetite change, chills, fatigue and fever.  HENT:  Negative for ear pain and sore throat.   Eyes:  Negative for pain and visual disturbance.  Respiratory:  Negative for cough, chest tightness and shortness of breath.   Cardiovascular:  Negative for chest pain and palpitations.  Gastrointestinal:  Negative for abdominal pain, nausea and vomiting.  Genitourinary:  Negative for dysuria and hematuria.  Musculoskeletal:  Positive for neck pain. Negative for arthralgias, back pain, joint swelling, myalgias and neck stiffness.  Skin:  Negative for color change and rash.  Neurological:  Negative for dizziness, seizures, syncope, speech difficulty, weakness, light-headedness and headaches.       Bilateral ulnar paresthesias  All other systems reviewed and are negative.  Physical Exam Updated Vital Signs BP (!) 172/80 (BP Location: Right Arm)   Pulse 66   Temp 97.6 F (36.4 C) (Oral)   Resp 16   SpO2 100%   Physical Exam Vitals and nursing note reviewed.  Constitutional:      General: She is not in acute distress.    Appearance: Normal appearance. She is well-developed. She is not ill-appearing, toxic-appearing or diaphoretic.  HENT:      Head: Normocephalic and atraumatic.     Right Ear: External ear normal.     Left Ear: External ear normal.     Nose: Nose normal.     Mouth/Throat:     Mouth: Mucous membranes are moist.  Eyes:     Extraocular Movements: Extraocular movements intact.     Conjunctiva/sclera: Conjunctivae normal.  Neck:     Comments: Appropriately healing scar in left anterior aspect of her neck Cardiovascular:     Rate and Rhythm: Normal rate and regular rhythm.     Heart sounds: No murmur heard. Pulmonary:     Effort: Pulmonary effort is normal. No respiratory distress.     Breath  sounds: Normal breath sounds.  Abdominal:     Palpations: Abdomen is soft.     Tenderness: There is no abdominal tenderness.  Musculoskeletal:     Cervical back: Neck supple. No tenderness.     Comments: Mild tenderness to trapezius muscles  Skin:    General: Skin is warm and dry.  Neurological:     General: No focal deficit present.     Mental Status: She is alert and oriented to person, place, and time.     Cranial Nerves: No cranial nerve deficit.     Sensory: No sensory deficit.     Motor: No weakness.     Comments: Strength and sensation to bilateral upper extremities is intact  Psychiatric:        Mood and Affect: Mood is anxious. Affect is tearful.        Speech: Speech normal.        Behavior: Behavior normal. Behavior is cooperative.    ED Results / Procedures / Treatments   Labs (all labs ordered are listed, but only abnormal results are displayed) Labs Reviewed  URINALYSIS, ROUTINE W REFLEX MICROSCOPIC - Abnormal; Notable for the following components:      Result Value   Specific Gravity, Urine <1.005 (*)    All other components within normal limits    EKG None  Radiology No results found.  Procedures Procedures   Medications Ordered in ED Medications  lidocaine (LIDODERM) 5 % 1 patch (1 patch Transdermal Patch Applied 02/18/21 0754)  methocarbamol (ROBAXIN) tablet 500 mg (500 mg Oral  Given 02/18/21 0749)  oxyCODONE-acetaminophen (PERCOCET/ROXICET) 5-325 MG per tablet 2 tablet (2 tablets Oral Given 02/18/21 0749)    ED Course  I have reviewed the triage vital signs and the nursing notes.  Pertinent labs & imaging results that were available during my care of the patient were reviewed by me and considered in my medical decision making (see chart for details).    MDM Rules/Calculators/A&P                           Patient is a 40 year old female who presents for postoperative pain.  She underwent ACDF of C6-7 3 weeks ago.  She has had ongoing burning pain in the posterior aspect of her neck and shoulders.  She has also had ongoing paresthesias to the ulnar distributions of her bilateral upper extremities.  Prior to being bedded in the ED, patient did have a prolonged waiting room stay.  Because of this, she has not received any analgesia since yesterday.  Typically, she takes scheduled oxycodone and tizanidine.  On initial assessment, patient endorses 10/10 severity pain.  Because of this pain, she is tearful.  She has had worsening anxiety due to the ongoing pain.  She has followed up with her neurosurgeon and has been in contact with his office.  Patient has been offered reassurance by her neurosurgeon.  Due to the persistence of the pain, patient presents to the ED.  On exam, patient has intact strength and sensation in her bilateral upper extremities.  She is well-appearing.  Surgical scar is well-healing.  Patient was given home medications for treatment of her pain.  On reassessment, she reports improvement.  Given reassuring exam, I do not feel that any imaging is indicated at this time.  Patient to follow-up with neurosurgery for further assessment and reassurance.  She is to continue physical therapy and to continue home pain regimen.  Patient  was discharged in good condition.  Final Clinical Impression(s) / ED Diagnoses Final diagnoses:  Neck pain    Rx / DC Orders ED  Discharge Orders     None        Gloris Manchester, MD 02/18/21 1020

## 2021-02-27 ENCOUNTER — Ambulatory Visit: Payer: Medicaid Other

## 2021-02-27 ENCOUNTER — Emergency Department (HOSPITAL_COMMUNITY)
Admission: EM | Admit: 2021-02-27 | Discharge: 2021-02-28 | Disposition: A | Discharge status: Home / Self Care | Payer: Medicaid Other | Attending: Emergency Medicine | Admitting: Emergency Medicine

## 2021-02-27 ENCOUNTER — Other Ambulatory Visit: Payer: Self-pay

## 2021-02-27 ENCOUNTER — Emergency Department (HOSPITAL_COMMUNITY): Payer: Medicaid Other

## 2021-02-27 ENCOUNTER — Encounter (HOSPITAL_COMMUNITY): Payer: Self-pay | Admitting: Emergency Medicine

## 2021-02-27 DIAGNOSIS — I10 Essential (primary) hypertension: Secondary | ICD-10-CM | POA: Insufficient documentation

## 2021-02-27 DIAGNOSIS — F17290 Nicotine dependence, other tobacco product, uncomplicated: Secondary | ICD-10-CM | POA: Diagnosis not present

## 2021-02-27 DIAGNOSIS — S62326A Displaced fracture of shaft of fifth metacarpal bone, right hand, initial encounter for closed fracture: Secondary | ICD-10-CM | POA: Diagnosis not present

## 2021-02-27 DIAGNOSIS — Z79899 Other long term (current) drug therapy: Secondary | ICD-10-CM | POA: Diagnosis not present

## 2021-02-27 DIAGNOSIS — W2209XA Striking against other stationary object, initial encounter: Secondary | ICD-10-CM | POA: Diagnosis not present

## 2021-02-27 DIAGNOSIS — S6991XA Unspecified injury of right wrist, hand and finger(s), initial encounter: Secondary | ICD-10-CM | POA: Diagnosis present

## 2021-02-27 IMAGING — CR DG HAND COMPLETE 3+V*R*
3 series · 3 of 3 positions shown · non-contrast
Comparison: None.

CLINICAL DATA: Hand injury.  Patient punched a wall.

EXAM:
RIGHT HAND - COMPLETE 3+ VIEW

[x hand pa right]
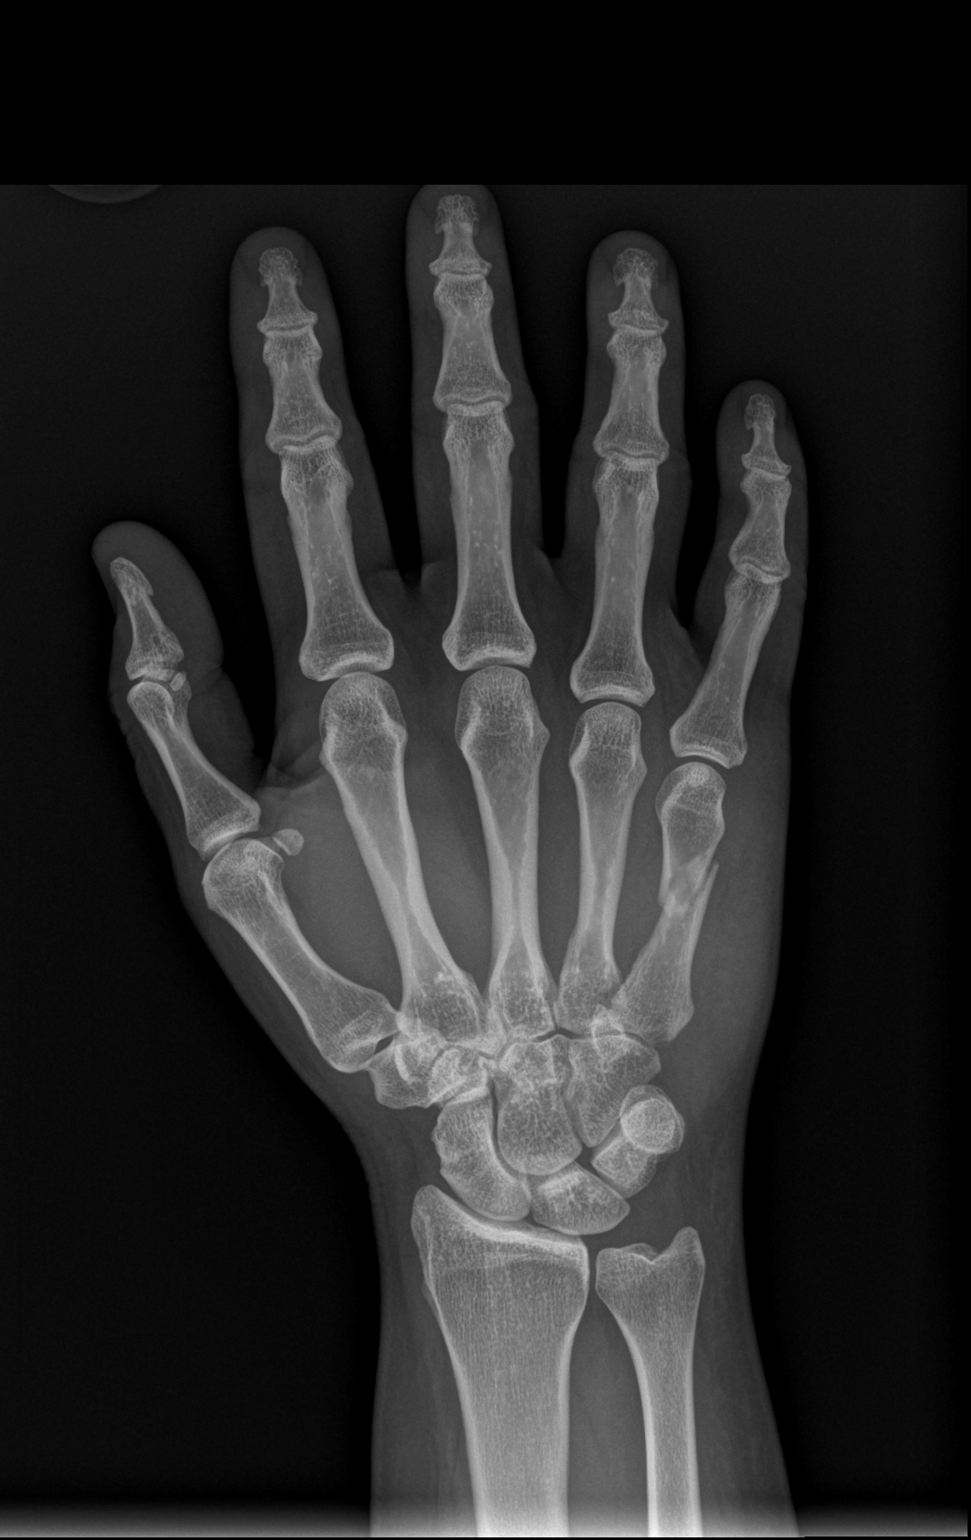

[x hand obl right]
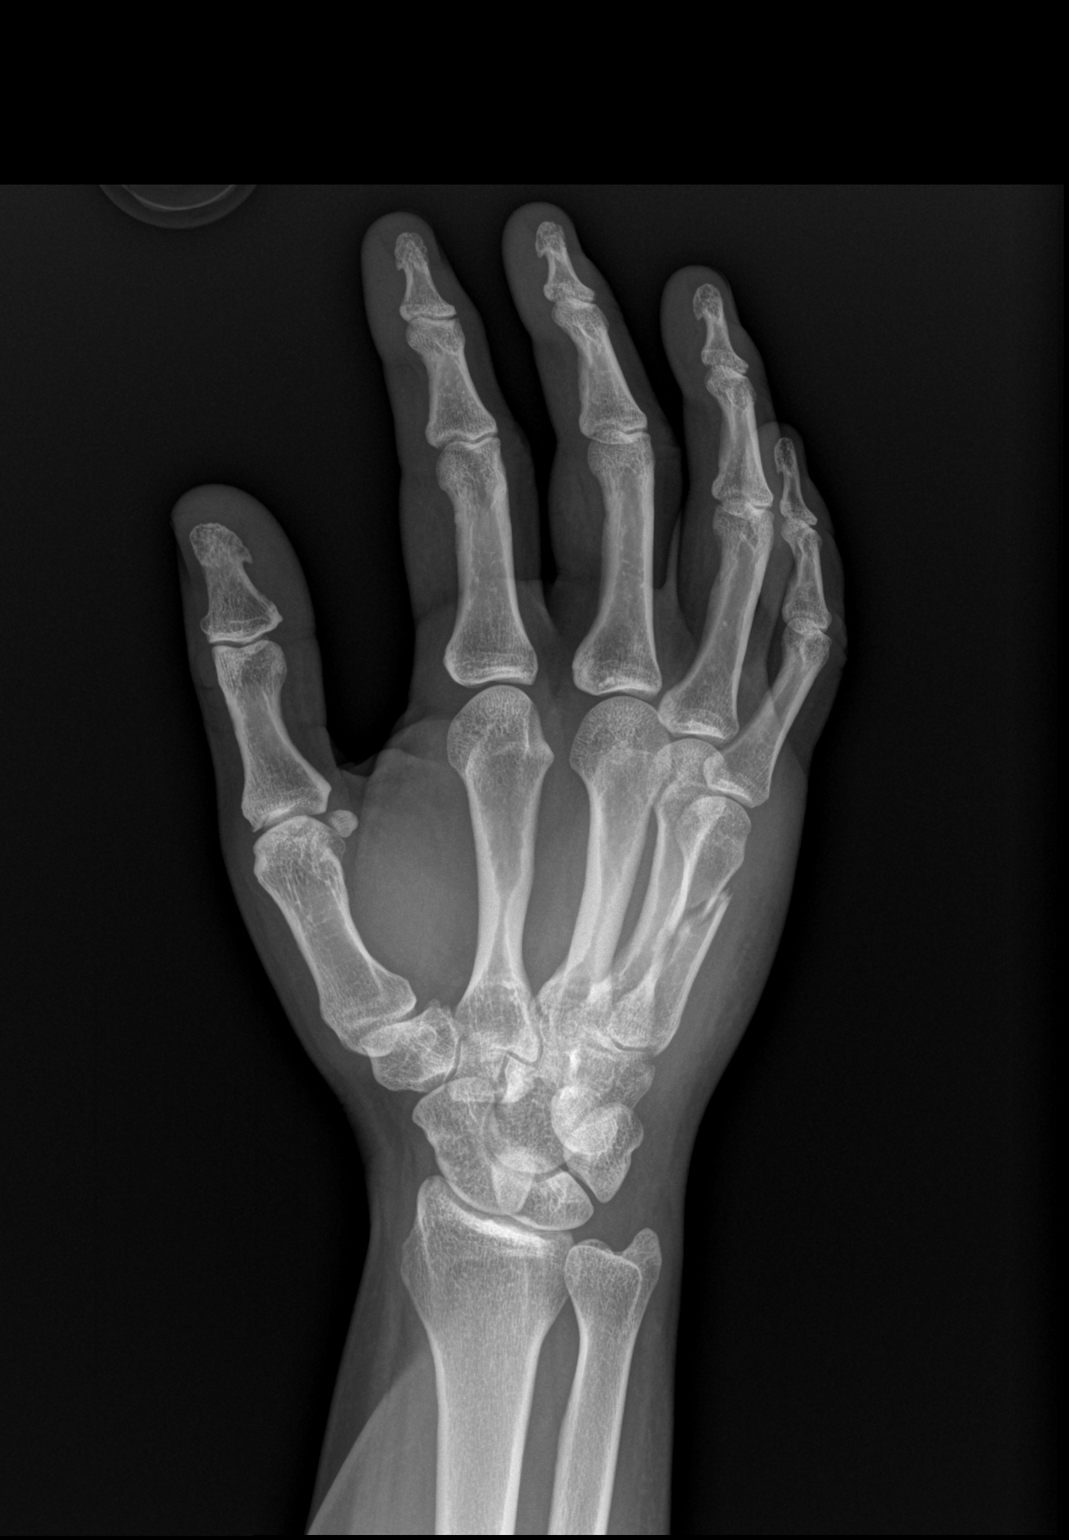

[x hand lat right]
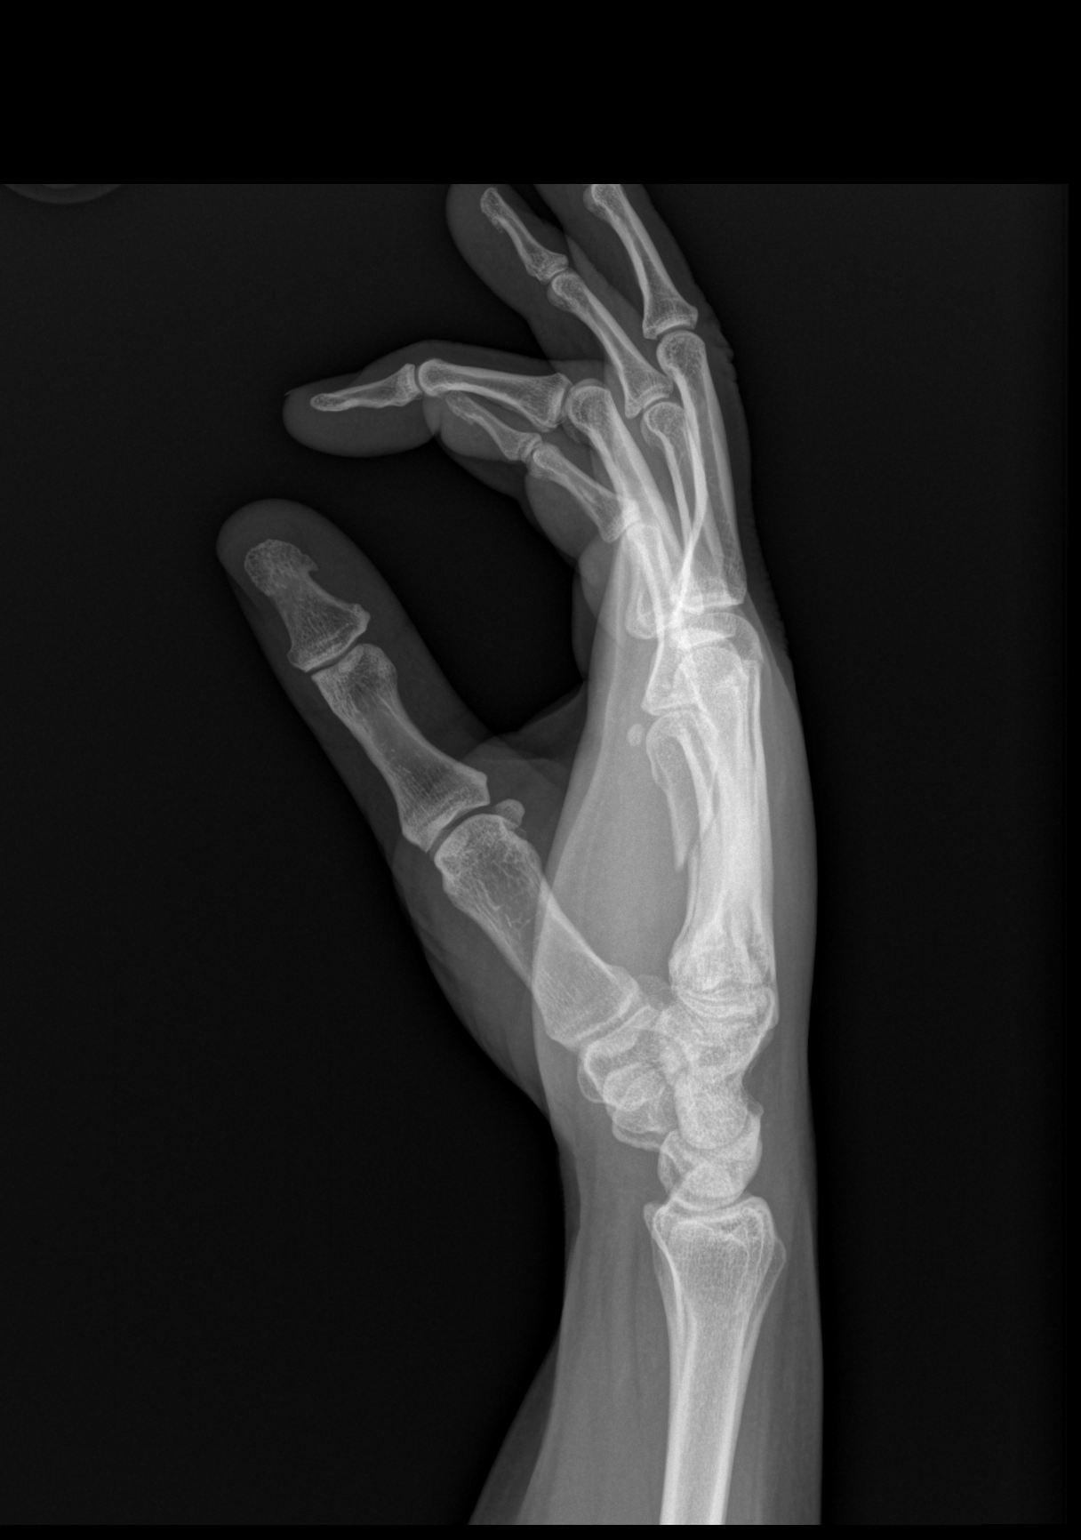

[3 of 3 positions shown; findings below may reference images not displayed]

FINDINGS: Oblique fracture of the midshaft right fifth metacarpal bone with
mild lateral displacement and volar displacement of the distal
fracture fragment. No articular involvement. Associated soft tissue
swelling. No additional fractures identified.
IMPRESSION: Oblique fracture midshaft right fifth metacarpal bone with mild
displacement.

## 2021-02-27 MED ORDER — OXYCODONE-ACETAMINOPHEN 5-325 MG PO TABS
1.0000 | ORAL_TABLET | Freq: Once | ORAL | Status: AC
  Administered 2021-02-27: 1 via ORAL
  Filled 2021-02-27: qty 1

## 2021-02-27 NOTE — ED Provider Notes (Signed)
[facility]  HOSPITAL-EMERGENCY DEPT Provider Note   CSN: [id number] Arrival date & time: 02/27/21  2024     History Chief Complaint  Patient presents with   Hand Pain    Tina Russell is a 40 y.o. female.  Patient has a past medical history of hypertension, bipolar disorder, PTSD, cervical spinal stenosis status post anterior cervical discectomy fusion in August 2022.  Patient presented today after hitting her right hand on the fifth metatarsal side on a door handle when she got angered by something earlier this afternoon.  She states that has been throbbing ever since.  It started to swell and bruise up.  She does have some neuropathy in her right pinky and right ring finger however she recently underwent an anterior cervical discectomy fusion couple weeks ago and has had neuropathy since then.  Patient denies any other injuries.  HPI     Past Medical History:  Diagnosis Date   ADHD    Anxiety    Bipolar disorder (HCC)    Cervical spinal stenosis 12/27/2020   C6-7 level   Chronic back pain    Common migraine with intractable migraine 12/27/2020   Depression    Family history of breast cancer    Family history of melanoma    Hypertension    Kidney failure    Migraine    Neuropathy    PTSD (post-traumatic stress disorder)     Patient Active Problem List   Diagnosis Date Noted   Cervical spondylosis with radiculopathy 01/27/2021   Genetic testing 01/10/2021   Family history of breast cancer 12/28/2020   Family history of melanoma 12/28/2020   Cervical spinal stenosis 12/27/2020   Common migraine with intractable migraine 12/27/2020    Past Surgical History:  Procedure Laterality Date   ANTERIOR CERVICAL DECOMP/DISCECTOMY FUSION N/A 01/28/2021   Procedure: Cervical Six-Seven Anterior Cervical Decompression/Discectomy/Fusion;  Surgeon: Coletta Memos, MD;  Location: [facility] OR;  Service: Neurosurgery;  Laterality: N/A;   DILATION AND CURETTAGE OF UTERUS     3  blood tranfusions     OB History   No obstetric history on file.     Family History  Problem Relation Age of Onset   Alcohol abuse Mother    Hypertension Mother    Hyperlipidemia Mother    Skin cancer Mother    Hypertension Father    Hyperlipidemia Father    Alcohol abuse Father    Alcohol abuse Brother    Hypertension Brother    Breast cancer Maternal Aunt        Neg GT in 2017   Melanoma Maternal Aunt    Hypertension Maternal Grandmother    Breast cancer Maternal Grandmother    Throat cancer Paternal Grandmother    Hypertension Paternal Grandfather    Breast cancer Other        Mother's 3 maternal cousins   Breast cancer Other    Melanoma Other        mother's maternal female first cousin    Social History   Tobacco Use   Smoking status: Every Day    Types: E-cigarettes   Smokeless tobacco: Never  Vaping Use   Vaping Use: Every day  Substance Use Topics   Alcohol use: Not Currently   Drug use: Yes    Types: Marijuana    Comment: to help with pain    Home Medications Prior to Admission medications   Medication Sig Start Date End Date Taking? Authorizing Provider  cloNIDine (CATAPRES) 0.1 MG  tablet Take 0.1 mg by mouth 2 (two) times daily. 11/15/20   [provider]  lamoTRIgine (LAMICTAL) 200 MG tablet Take 200 mg by mouth at bedtime. 11/29/20   [provider]  levonorgestrel (MIRENA) 20 MCG/24HR IUD by Intrauterine route.    [provider]  methocarbamol (ROBAXIN) 500 MG tablet Take 1 tablet (500 mg total) by mouth 4 (four) times daily. 01/29/21   Meyran, Tiana Loft, NP  oxyCODONE-acetaminophen (PERCOCET) 5-325 MG tablet Take 1 tablet by mouth every 4 (four) hours as needed for severe pain. Patient not taking: Reported on 02/16/2021 01/30/21 01/30/22  Jadene Pierini, MD  QUEtiapine (SEROQUEL) 25 MG tablet Take 75-100 mg by mouth at bedtime. 07/25/20   [provider]    Allergies    Amoxicillin-pot clavulanate and  Strattera [atomoxetine hcl]  Review of Systems   Review of Systems  Constitutional:  Negative for chills and fever.  HENT:  Negative for congestion and rhinorrhea.   Eyes:  Negative for visual disturbance.  Respiratory:  Negative for cough, chest tightness and shortness of breath.   Cardiovascular:  Negative for chest pain, palpitations and leg swelling.  Gastrointestinal:  Negative for abdominal pain, constipation, diarrhea, nausea and vomiting.  Genitourinary:  Negative for difficulty urinating.  Musculoskeletal:  Positive for arthralgias and joint swelling. Negative for back pain.  Skin:  Negative for rash and wound.  Neurological:  Positive for numbness. Negative for dizziness, syncope, weakness, light-headedness and headaches.  All other systems reviewed and are negative.  Physical Exam Updated Vital Signs BP (!) 151/108   Pulse 63   Temp 98.2 F (36.8 C)   Resp 16   SpO2 100%   Physical Exam Vitals and nursing note reviewed.  Constitutional:      General: She is not in acute distress.    Appearance: She is not toxic-appearing.  HENT:     Head: Normocephalic and atraumatic.  Eyes:     General: No scleral icterus.       Right eye: No discharge.        Left eye: No discharge.  Pulmonary:     Effort: No respiratory distress.  Musculoskeletal:     Right hand: Swelling, tenderness and bony tenderness present. No deformity or lacerations. Decreased range of motion. Decreased strength. Decreased sensation of the ulnar distribution. Normal capillary refill. Normal pulse.     Left hand: Normal.     Comments: Bruising noted on right hand  Skin:    General: Skin is warm and dry.  Neurological:     Mental Status: She is alert.  Psychiatric:        Mood and Affect: Mood normal.        Behavior: Behavior normal.    ED Results / Procedures / Treatments   Labs (all labs ordered are listed, but only abnormal results are displayed) Labs Reviewed - No data to  display  EKG None  Radiology DG Hand Complete Right  Result Date: 02/27/2021 CLINICAL DATA:  Hand injury.  Patient punched a wall. EXAM: RIGHT HAND - COMPLETE 3+ VIEW COMPARISON:  None. FINDINGS: Oblique fracture of the midshaft right fifth metacarpal bone with mild lateral displacement and volar displacement of the distal fracture fragment. No articular involvement. Associated soft tissue swelling. No additional fractures identified. IMPRESSION: Oblique fracture midshaft right fifth metacarpal bone with mild displacement. Electronically Signed   By: Burman Nieves M.D.   On: 02/27/2021 21:34    Procedures Procedures   Medications Ordered in  ED Medications  oxyCODONE-acetaminophen (PERCOCET/ROXICET) 5-325 MG per tablet 1 tablet (1 tablet Oral Given 02/27/21 2226)    ED Course  I have reviewed the triage vital signs and the nursing notes.  Pertinent labs & imaging results that were available during my care of the patient were reviewed by me and considered in my medical decision making (see chart for details).    MDM Rules/Calculators/A&P                          Is a well-appearing 40 year old female who presents the emergency department with right hand pain after hitting her hand against a door handle.  She is afebrile and her vital signs are hemodynamically stable.  Her exam is notable for swelling and bruising of her right dorsum with limited range of motion of her right pinky.  She does have numbness and tingling in her ulnar distribution of her hand however she recently had a discectomy of her cervical spine and that she has had the symptoms prior to this injury.  Will prescribe her home pain medication Percocet for her pain while she is here. She will have right ulnar gutter splint placed in the ED. She will need to follow-up with hand surgery outpatient.   Final Clinical Impression(s) / ED Diagnoses Final diagnoses:  Closed displaced fracture of shaft of fifth metacarpal  bone of right hand, initial encounter    Rx / DC Orders ED Discharge Orders          Ordered    Ambulatory referral to Hand Surgery        02/27/21 2232             Claudie Leach, PA-C 02/28/21 0019    Charlynne Pander, MD 03/01/21 2110

## 2021-02-27 NOTE — ED Notes (Signed)
Ortho called and they stated they are on their way.

## 2021-02-27 NOTE — ED Triage Notes (Signed)
Patient thought she was going to hit the wall, but she actually hit a door handle with her right hand. She now complains of right wrist and hand pain.

## 2021-02-27 NOTE — Discharge Instructions (Addendum)
You have fractured your 5th metatarsal finger in  your right hand. It is midshaft. It is mildly displaced. We have placed an ulnar gutter splint. You need to call the Hand Surgery Doctor provided in your discharge paperwork. (Dr. Yehuda Budd).

## 2021-02-28 ENCOUNTER — Ambulatory Visit (INDEPENDENT_AMBULATORY_CARE_PROVIDER_SITE_OTHER): Payer: Medicaid Other | Admitting: Family Medicine

## 2021-02-28 ENCOUNTER — Encounter: Payer: Self-pay | Admitting: Family Medicine

## 2021-02-28 VITALS — BP 126/84 | HR 60 | Temp 98.2°F | Resp 16

## 2021-02-28 DIAGNOSIS — Z7689 Persons encountering health services in other specified circumstances: Secondary | ICD-10-CM

## 2021-02-28 DIAGNOSIS — M4800 Spinal stenosis, site unspecified: Secondary | ICD-10-CM | POA: Diagnosis not present

## 2021-02-28 NOTE — Progress Notes (Signed)
Patient is looking for PCP.   Patient is her for HFU from [facility].

## 2021-02-28 NOTE — Progress Notes (Signed)
Orthopedic Tech Progress Note Patient Details:  Tina Russell 1980/07/19 [id number]  Ortho Devices Type of Ortho Device: Ulna gutter splint Ortho Device/Splint Location: RUE Ortho Device/Splint Interventions: Ordered, Application, Adjustment   Post Interventions Patient Tolerated: Well Instructions Provided: Care of device, Poper ambulation with device  Alper Guilmette 02/28/2021, 12:17 AM

## 2021-02-28 NOTE — Progress Notes (Signed)
New Patient Office Visit  Subjective:  Patient ID: Tina Russell, adult    DOB: [birth year]  Age: 40 y.o. MRN: [id number]  CC:  Chief Complaint  Patient presents with   Establish Care   Follow-up    hospital    HPI [name] presents for to establish care. Patient reports that she had recent surgery for her cervical spinal stenosis and needed a PCP.   Past Medical History:  Diagnosis Date   ADHD    ADHD    Anxiety    Bipolar disorder (HCC)    Cervical spinal stenosis 12/27/2020   C6-7 level   Chronic back pain    Common migraine with intractable migraine 12/27/2020   Depression    Family history of breast cancer    Family history of melanoma    Hypertension    Kidney failure    Migraine    Neuropathy    PTSD (post-traumatic stress disorder)     Past Surgical History:  Procedure Laterality Date   ANTERIOR CERVICAL DECOMP/DISCECTOMY FUSION N/A 01/28/2021   Procedure: Cervical Six-Seven Anterior Cervical Decompression/Discectomy/Fusion;  Surgeon: Coletta Memos, MD;  Location: [facility] OR;  Service: Neurosurgery;  Laterality: N/A;   DILATION AND CURETTAGE OF UTERUS     3 blood tranfusions    Family History  Problem Relation Age of Onset   Alcohol abuse Mother    Hypertension Mother    Hyperlipidemia Mother    Skin cancer Mother    Hypertension Father    Hyperlipidemia Father    Alcohol abuse Father    Alcohol abuse Brother    Hypertension Brother    Breast cancer Maternal Aunt        Neg GT in 2017   Melanoma Maternal Aunt    Hypertension Maternal Grandmother    Breast cancer Maternal Grandmother    Throat cancer Paternal Grandmother    Hypertension Paternal Grandfather    Breast cancer Other        Mother's 3 maternal cousins   Breast cancer Other    Melanoma Other        mother's maternal female first cousin    Social History   Socioeconomic History   Marital status: Single    Spouse name: Not on file   Number of children: 1   Years of education: Not  on file   Highest education level: Bachelor's degree (e.g., BA, AB, BS)  Occupational History   Not on file  Tobacco Use   Smoking status: Former    Types: E-cigarettes    Quit date: 01/20/2021    Years since quitting: 0.1   Smokeless tobacco: Never  Vaping Use   Vaping Use: Former  Substance and Sexual Activity   Alcohol use: Not Currently   Drug use: Yes    Types: Marijuana    Comment: to help with pain   Sexual activity: Not on file  Other Topics Concern   Not on file  Social History Narrative   Lives with partner, son   Social Determinants of Health   Financial Resource Strain: Not on file  Food Insecurity: Not on file  Transportation Needs: Not on file  Physical Activity: Not on file  Stress: Not on file  Social Connections: Not on file  Intimate Partner Violence: Not on file    ROS Review of Systems  All other systems reviewed and are negative.  Objective:   Today's Vitals: BP 126/84 (BP Location: Right Arm, Patient Position: Sitting, Cuff Size:  Normal)   Pulse 60   Temp 98.2 F (36.8 C) (Oral)   Resp 16   SpO2 97%   Physical Exam Vitals and nursing note reviewed.  Constitutional:      General: DOYNE Russell is not in acute distress. Neck:     Comments: Patient with cervical collar Cardiovascular:     Rate and Rhythm: Normal rate and regular rhythm.  Pulmonary:     Effort: Pulmonary effort is normal.     Breath sounds: Normal breath sounds.  Musculoskeletal:     Comments: Right hand/forearm with splint noted  Neurological:     General: No focal deficit present.     Mental Status: SIGNE Russell is alert and oriented to person, place, and time.    Assessment & Plan:  1. Spinal stenosis, not cervical Management as per consultant  2. Encounter to establish care    Outpatient Encounter Medications as of 02/28/2021  Medication Sig   cloNIDine (CATAPRES) 0.1 MG tablet Take 0.1 mg by mouth 2 (two) times daily.   gabapentin (NEURONTIN) 300 MG  capsule Take 300 mg by mouth 3 (three) times daily.   lamoTRIgine (LAMICTAL) 200 MG tablet Take 200 mg by mouth at bedtime.   levonorgestrel (MIRENA) 20 MCG/24HR IUD by Intrauterine route.   oxyCODONE-acetaminophen (PERCOCET) 5-325 MG tablet Take 1 tablet by mouth every 4 (four) hours as needed for severe pain.   methocarbamol (ROBAXIN) 500 MG tablet Take 1 tablet (500 mg total) by mouth 4 (four) times daily.   QUEtiapine (SEROQUEL) 25 MG tablet Take 75-100 mg by mouth at bedtime.   No facility-administered encounter medications on file as of 02/28/2021.    Follow-up: Return in about 4 months (around 06/30/2021) for physical.   [name], MD

## 2021-03-02 ENCOUNTER — Ambulatory Visit: Payer: Medicaid Other

## 2021-03-06 ENCOUNTER — Ambulatory Visit: Payer: Medicaid Other

## 2021-03-10 ENCOUNTER — Ambulatory Visit: Payer: Medicaid Other | Admitting: Physical Therapy

## 2021-03-13 ENCOUNTER — Other Ambulatory Visit: Payer: Self-pay

## 2021-03-13 ENCOUNTER — Inpatient Hospital Stay: Payer: Medicaid Other | Admitting: Hematology and Oncology

## 2021-03-13 NOTE — Progress Notes (Signed)
[facility] CONSULT NOTE  Patient Care Team: Georganna Skeans, MD as PCP - General (Family Medicine) Betha Loa, MD as Consulting Physician (Orthopedic Surgery) Dannielle Huh, MD as Consulting Physician (Orthopedic Surgery)  CHIEF COMPLAINTS/PURPOSE OF CONSULTATION:    ASSESSMENT & PLAN:  No problem-specific Assessment & Plan notes found for this encounter.  No orders of the defined types were placed in this encounter.  We discussed her life time risk of breast cancer and 5 yr risk of breast cancer today which is 26%  2. We discussed different risk reducing strategies for future breast cancer risk. We discussed chemoprevention and lifestyle modification. We discussed role of ongoing surveillance with mammograms versus MRIs. Patient would be a good candidate for chemoprevention with  tamoxifen or Evista or aromatase inhibitors. We discussed different medications available. We discussed their particular side effects. With tamoxifen side effects would include but not limited to cataracts, increased risk of DVT/PE/cardiovascular events, uterine malignancy, hot flashes, and mood swings. With Evista patient would and could experience mood swings, hot flashes ,aches and pains , and possibly thrombosis. Aromatase inhibitors side effects also include but not limiting to bone loss, aches and pains, hyperlipidemia, hot flashes and mood swings.  Patient felt more comfortable with starting ****.   3. Lifestyle modification: We discussed different interventions exercise at least 5 days a week including both aerobic as well as weight-bearing exercises and strength training. He discussed dietary modification including increasing number of servings of fruit and vegetables as well as decreased in number of servings of meat. We discussed the importance of maintaining a good BMI and limiting alcohol intake.  4. Surveillance: Patient certainly would be a good candidate for ongoing mammograms on a  yearly basis. Certainly she could benefit from a 3-D mammogram. We discussed self breast examination as well as ongoing clinical examination.   5. Chemoprevention: ***  6. Genetics: Genetic testing ****  7. Followup: Patient will be seen back in 6 months time in followup. We will check her CBC as well as cmet at the time.   HISTORY OF PRESENTING ILLNESS:  Tina Russell 40 y.o. adult is here because she has high risk for breast cancer.    REVIEW OF SYSTEMS:   Constitutional: Denies fevers, chills or abnormal night sweats Eyes: Denies blurriness of vision, double vision or watery eyes Ears, nose, mouth, throat, and face: Denies mucositis or sore throat Respiratory: Denies cough, dyspnea or wheezes Cardiovascular: Denies palpitation, chest discomfort or lower extremity swelling Gastrointestinal:  Denies nausea, heartburn or change in bowel habits Skin: Denies abnormal skin rashes Lymphatics: Denies new lymphadenopathy or easy bruising Neurological:Denies numbness, tingling or new weaknesses Behavioral/Psych: Mood is stable, no new changes  All other systems were reviewed with the patient and are negative.  MEDICAL HISTORY:  Past Medical History:  Diagnosis Date  . ADHD   . ADHD   . Anxiety   . Bipolar disorder (HCC)   . Cervical spinal stenosis 12/27/2020   C6-7 level  . Chronic back pain   . Common migraine with intractable migraine 12/27/2020  . Depression   . Family history of breast cancer   . Family history of melanoma   . Hypertension   . Kidney failure   . Migraine   . Neuropathy   . PTSD (post-traumatic stress disorder)     SURGICAL HISTORY: Past Surgical History:  Procedure Laterality Date  . ANTERIOR CERVICAL DECOMP/DISCECTOMY FUSION N/A 01/28/2021   Procedure: Cervical Six-Seven Anterior Cervical Decompression/Discectomy/Fusion;  Surgeon:  Coletta Memos, MD;  Location: [facility] OR;  Service: Neurosurgery;  Laterality: N/A;  . DILATION AND CURETTAGE OF UTERUS     3  blood tranfusions    SOCIAL HISTORY: Social History   Socioeconomic History  . Marital status: Single    Spouse name: Not on file  . Number of children: 1  . Years of education: Not on file  . Highest education level: Bachelor's degree (e.g., BA, AB, BS)  Occupational History  . Not on file  Tobacco Use  . Smoking status: Former    Types: E-cigarettes    Quit date: 01/20/2021    Years since quitting: 0.1  . Smokeless tobacco: Never  Vaping Use  . Vaping Use: Former  Substance and Sexual Activity  . Alcohol use: Not Currently  . Drug use: Yes    Types: Marijuana    Comment: to help with pain  . Sexual activity: Not on file  Other Topics Concern  . Not on file  Social History Narrative   Lives with partner, son   Social Determinants of Health   Financial Resource Strain: Not on file  Food Insecurity: Not on file  Transportation Needs: Not on file  Physical Activity: Not on file  Stress: Not on file  Social Connections: Not on file  Intimate Partner Violence: Not on file    FAMILY HISTORY: Family History  Problem Relation Age of Onset  . Alcohol abuse Mother   . Hypertension Mother   . Hyperlipidemia Mother   . Skin cancer Mother   . Hypertension Father   . Hyperlipidemia Father   . Alcohol abuse Father   . Alcohol abuse Brother   . Hypertension Brother   . Breast cancer Maternal Aunt        Neg GT in 2017  . Melanoma Maternal Aunt   . Hypertension Maternal Grandmother   . Breast cancer Maternal Grandmother   . Throat cancer Paternal Grandmother   . Hypertension Paternal Grandfather   . Breast cancer Other        Mother's 3 maternal cousins  . Breast cancer Other   . Melanoma Other        mother's maternal female first cousin    ALLERGIES:  is allergic to amoxicillin-pot clavulanate and strattera [atomoxetine hcl].  MEDICATIONS:  Current Outpatient Medications  Medication Sig Dispense Refill  . cloNIDine (CATAPRES) 0.1 MG tablet Take 0.1 mg by  mouth 2 (two) times daily.    Marland Kitchen gabapentin (NEURONTIN) 300 MG capsule Take 300 mg by mouth 3 (three) times daily.    Marland Kitchen lamoTRIgine (LAMICTAL) 200 MG tablet Take 200 mg by mouth at bedtime.    Marland Kitchen levonorgestrel (MIRENA) 20 MCG/24HR IUD by Intrauterine route.    Marland Kitchen oxyCODONE-acetaminophen (PERCOCET) 5-325 MG tablet Take 1 tablet by mouth every 4 (four) hours as needed for severe pain. 30 tablet 0   No current facility-administered medications for this visit.     PHYSICAL EXAMINATION: ECOG PERFORMANCE STATUS:   There were no vitals filed for this visit. There were no vitals filed for this visit.  GENERAL:alert, no distress and comfortable SKIN: skin color, texture, turgor are normal, no rashes or significant lesions EYES: normal, conjunctiva are pink and non-injected, sclera clear OROPHARYNX:no exudate, no erythema and lips, buccal mucosa, and tongue normal  NECK: supple, thyroid normal size, non-tender, without nodularity LYMPH:  no palpable lymphadenopathy in the cervical, axillary or inguinal LUNGS: clear to auscultation and percussion with normal breathing effort HEART: regular rate &  rhythm and no murmurs and no lower extremity edema ABDOMEN:abdomen soft, non-tender and normal bowel sounds Musculoskeletal:no cyanosis of digits and no clubbing  PSYCH: alert & oriented x 3 with fluent speech NEURO: no focal motor/sensory deficits  LABORATORY DATA:  I have reviewed the data as listed Lab Results  Component Value Date   WBC 7.5 01/27/2021   HGB 13.4 01/27/2021   HCT 40.1 01/27/2021   MCV 93.0 01/27/2021   PLT 217 01/27/2021     Chemistry      Component Value Date/Time   NA 137 01/27/2021 1305   K 4.2 01/27/2021 1305   CL 105 01/27/2021 1305   CO2 25 01/27/2021 1305   BUN 9 01/27/2021 1305   CREATININE 0.81 01/27/2021 1305      Component Value Date/Time   CALCIUM 8.7 (L) 01/27/2021 1305       RADIOGRAPHIC STUDIES: I have personally reviewed the radiological images  as listed and agreed with the findings in the report. DG Hand Complete Right  Result Date: 02/27/2021 CLINICAL DATA:  Hand injury.  Patient punched a wall. EXAM: RIGHT HAND - COMPLETE 3+ VIEW COMPARISON:  None. FINDINGS: Oblique fracture of the midshaft right fifth metacarpal bone with mild lateral displacement and volar displacement of the distal fracture fragment. No articular involvement. Associated soft tissue swelling. No additional fractures identified. IMPRESSION: Oblique fracture midshaft right fifth metacarpal bone with mild displacement. Electronically Signed   By: Burman Nieves M.D.   On: 02/27/2021 21:34    All questions were answered. The patient knows to call the clinic with any problems, questions or concerns. I spent *** minutes in the care of this patient including H and P, review of records, counseling and coordination of care.     Rachel Moulds, MD 03/14/2021 8:52 AM     This encounter was created in error - please disregard.

## 2021-03-14 ENCOUNTER — Telehealth: Payer: Self-pay | Admitting: Neurology

## 2021-03-14 NOTE — Telephone Encounter (Signed)
Contacted pt back, she stated She is experiencing intense pain on L side as well as involuntary movements, mostly in L arrm. These symptom have worsen since surgery. She is not sure if this is neurosurgery issue or neurology issue. She can't get in touch with neurosurgery and is getting worried. Not sure what to do. Any suggestions? Please advise

## 2021-03-14 NOTE — Telephone Encounter (Signed)
Pt called says she had her neurosurgery back in August, however she is experiencing some symptoms that she doesn't think is coming for her surgery. Pt states she has called the neurosurgery office and also went to the ED. Pt requesting a call back.

## 2021-03-14 NOTE — Telephone Encounter (Signed)
I called the patient.  The patient indicates that she has had some myoclonic jerks involving the left arm and left leg over the last month primarily, but she has had some minor issues with this even before the cervical spine surgery.  She went up on her gabapentin dose to months ago from 300 mg 3 times a day to 600 mg 3 times daily.  She has not had any jerks or twitches on the right side of the body.  Occasionally, she may feel a sharp pain down the right arm with the jerk but this is not always the case.  She did have a fall at a swimming pool several weeks ago, she has had some low back pain since that time.  She will drop the gabapentin dose to 300 mg 3 times daily.  We will need to see her in the office for a full evaluation.

## 2021-03-14 NOTE — Telephone Encounter (Signed)
I called the patient. I left a message, I will call back later. 

## 2021-03-15 NOTE — Telephone Encounter (Signed)
I called the pt and left a vm asking for CB to have appt scheduled. Per dr. Anne Hahn if not availability pt can be scheduled to see Dr. Terrace Arabia, Dr. Marjory Lies, or Dr. Lucia Gaskins.  TE staff can assist with scheduling this appt.

## 2021-03-16 ENCOUNTER — Inpatient Hospital Stay: Payer: Medicaid Other | Attending: Genetic Counselor | Admitting: Hematology and Oncology

## 2021-03-16 ENCOUNTER — Other Ambulatory Visit: Payer: Self-pay

## 2021-03-16 ENCOUNTER — Encounter: Payer: Self-pay | Admitting: Hematology and Oncology

## 2021-03-16 VITALS — BP 142/94 | HR 66 | Temp 97.0°F | Resp 18 | Wt 150.2 lb

## 2021-03-16 DIAGNOSIS — Z803 Family history of malignant neoplasm of breast: Secondary | ICD-10-CM | POA: Diagnosis not present

## 2021-03-16 DIAGNOSIS — Z1501 Genetic susceptibility to malignant neoplasm of breast: Secondary | ICD-10-CM | POA: Diagnosis not present

## 2021-03-16 DIAGNOSIS — Z9189 Other specified personal risk factors, not elsewhere classified: Secondary | ICD-10-CM

## 2021-03-16 NOTE — Progress Notes (Signed)
[facility] CONSULT NOTE  Patient Care Team: Georganna Skeans, MD as PCP - General (Family Medicine) Betha Loa, MD as Consulting Physician (Orthopedic Surgery) Dannielle Huh, MD as Consulting Physician (Orthopedic Surgery)  CHIEF COMPLAINTS/PURPOSE OF CONSULTATION:  High risk for breast cancer.  ASSESSMENT & PLAN:    We discussed her life time risk of breast cancer and 5 yr risk of breast cancer today which is 26% and 1.6% respectively  2. We discussed different risk reducing strategies for future breast cancer risk.  We discussed about annual MRIs in addition to mammograms.  We have discussed about unknown long-term risks of multiple MRIs as well as increased need for biopsies with MRIs at times.  She is worried about long-term complications of gadolinium deposition hence she would like to consider MRI every 2 years instead of annually.  We will order the first MRI to be done in November 2022 and reassess.  3. Lifestyle modification: We discussed different interventions exercise at least 5 days a week including both aerobic as well as weight-bearing exercises and strength training. He discussed dietary modification including increasing number of servings of fruit and vegetables as well as decreased in number of servings of meat. We discussed the importance of maintaining a good BMI and limiting alcohol intake.  4. Surveillance: As mentioned above.  Self breast exam monthly, physical examination with breast oncology every 6 months and imaging annually.  5. Chemoprevention: Her current 5-year risk of breast cancer is 1.6% which is right at the borderline.  Given underlying psychiatric issues and her risk right below the cutoff, I do not recommend any treatment chemoprevention at this time.  We have briefly discussed about tamoxifen.  6. 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  7. Followup: Patient will be seen back in 6 months time in followup.   With regards to her  psychiatric issues, have encouraged her to find a new psychiatrist to help her as well as minimize stress.  She has some ongoing pain issues in her left axilla and she wonders if this is from her recent surgery.  I encouraged her to talk to her neurosurgeon about pain management.  She expressed understanding of all the recommendations.   HISTORY OF PRESENTING ILLNESS:   Tina Russell 40 y.o. adult is here because she has high risk for breast cancer.  Patient arrived to the appointment today by herself.  She mentions family history of breast cancer in maternal aunt, maternal grandmother as well as some maternal cousins.  She denies any history of ovarian cancer.  She is not sure of melanoma in her mother however her mom had some kind of skin cancer mostly basal cell carcinoma.  She is currently dealing with a lot of pain issues given her recent surgery, right little finger fracture and some ongoing psychiatric issues.  She says she is trying to find a new psychiatrist, she did not fit well with her current psychiatrist.  She apparently had a episode of rage when she tried to hit her hand against the wall and broke her little finger.  She is otherwise in good health.  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  No other breast biopsies.  Mammogram in May 2022. Rest of the pertinent 10 point ROS reviewed and negative.  Family history significant for breast cancer in maternal aunt negative genetic testing, maternal grandmother, mother's 3 maternal cousins.  Father had cancer polyps.  Age at menarche: 70 Age at first  childbirth: 28 She is not on any hormone replacement therapy or birth control at this time  REVIEW OF SYSTEMS:   Constitutional: Denies fevers, chills or abnormal night sweats Eyes: Denies blurriness of vision, double vision or watery eyes Ears, nose, mouth, throat, and face: Denies mucositis or sore throat Respiratory: Denies cough, dyspnea or  wheezes Cardiovascular: Denies palpitation, chest discomfort or lower extremity swelling Gastrointestinal:  Denies nausea, heartburn or change in bowel habits Skin: Denies abnormal skin rashes Lymphatics: Denies new lymphadenopathy or easy bruising Neurological:Denies numbness, tingling or new weaknesses Behavioral/Psych: Mood is stable, no new changes  All other systems were reviewed with the patient and are negative.  MEDICAL HISTORY:  Past Medical History:  Diagnosis Date   ADHD    ADHD    Anxiety    Bipolar disorder (HCC)    Cervical spinal stenosis 12/27/2020   C6-7 level   Chronic back pain    Common migraine with intractable migraine 12/27/2020   Depression    Family history of breast cancer    Family history of melanoma    Hypertension    Kidney failure    Migraine    Neuropathy    PTSD (post-traumatic stress disorder)     SURGICAL HISTORY: Past Surgical History:  Procedure Laterality Date   ANTERIOR CERVICAL DECOMP/DISCECTOMY FUSION N/A 01/28/2021   Procedure: Cervical Six-Seven Anterior Cervical Decompression/Discectomy/Fusion;  Surgeon: Coletta Memos, MD;  Location: [facility] OR;  Service: Neurosurgery;  Laterality: N/A;   DILATION AND CURETTAGE OF UTERUS     3 blood tranfusions    SOCIAL HISTORY: Social History   Socioeconomic History   Marital status: Single    Spouse name: Not on file   Number of children: 1   Years of education: Not on file   Highest education level: Bachelor's degree (e.g., BA, AB, BS)  Occupational History   Not on file  Tobacco Use   Smoking status: Former    Types: E-cigarettes    Quit date: 01/20/2021    Years since quitting: 0.1   Smokeless tobacco: Never  Vaping Use   Vaping Use: Former  Substance and Sexual Activity   Alcohol use: Not Currently   Drug use: Yes    Types: Marijuana    Comment: to help with pain   Sexual activity: Not on file  Other Topics Concern   Not on file  Social History Narrative   Lives with  partner, son   Social Determinants of Health   Financial Resource Strain: Not on file  Food Insecurity: Not on file  Transportation Needs: Not on file  Physical Activity: Not on file  Stress: Not on file  Social Connections: Not on file  Intimate Partner Violence: Not on file    FAMILY HISTORY: Family History  Problem Relation Age of Onset   Alcohol abuse Mother    Hypertension Mother    Hyperlipidemia Mother    Skin cancer Mother    Hypertension Father    Hyperlipidemia Father    Alcohol abuse Father    Alcohol abuse Brother    Hypertension Brother    Breast cancer Maternal Aunt        Neg GT in 2017   Melanoma Maternal Aunt    Hypertension Maternal Grandmother    Breast cancer Maternal Grandmother    Throat cancer Paternal Grandmother    Hypertension Paternal Grandfather    Breast cancer Other        Mother's 3 maternal cousins   Breast cancer Other  Melanoma Other        mother's maternal female first cousin    ALLERGIES:  is allergic to amoxicillin-pot clavulanate and strattera [atomoxetine hcl].  MEDICATIONS:  Current Outpatient Medications  Medication Sig Dispense Refill   cloNIDine (CATAPRES) 0.1 MG tablet Take 0.1 mg by mouth 2 (two) times daily.     gabapentin (NEURONTIN) 300 MG capsule Take 300 mg by mouth 3 (three) times daily.     lamoTRIgine (LAMICTAL) 200 MG tablet Take 200 mg by mouth at bedtime.     levonorgestrel (MIRENA) 20 MCG/24HR IUD by Intrauterine route.     tiZANidine (ZANAFLEX) 4 MG tablet Take 4 mg by mouth every 6 (six) hours as needed for muscle spasms.     oxyCODONE-acetaminophen (PERCOCET) 5-325 MG tablet Take 1 tablet by mouth every 4 (four) hours as needed for severe pain. (Patient not taking: Reported on 03/16/2021) 30 tablet 0   No current facility-administered medications for this visit.   PHYSICAL EXAMINATION: ECOG PERFORMANCE STATUS:   Vitals:   03/16/21 1001  BP: (!) 142/94  Pulse: 66  Resp: 18  Temp: (!) 97 F (36.1  C)  SpO2: 98%   Filed Weights   03/16/21 1001  Weight: 150 lb 3 oz (68.1 kg)    GENERAL:alert, no distress and comfortable Breast: Bilateral breasts inspected.  No palpable masses, skin changes or nipple changes.  No regional adenopathy. Lower extremities: No lower extremity edema.  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  RADIOGRAPHIC STUDIES: I have personally reviewed the radiological images as listed and agreed with the findings in the report. DG Hand Complete Right  Result Date: 02/27/2021 CLINICAL DATA:  Hand injury.  Patient punched a wall. EXAM: RIGHT HAND - COMPLETE 3+ VIEW COMPARISON:  None. FINDINGS: Oblique fracture of the midshaft right fifth metacarpal bone with mild lateral displacement and volar displacement of the distal fracture fragment. No articular involvement. Associated soft tissue swelling. No additional fractures identified. IMPRESSION: Oblique fracture midshaft right fifth metacarpal bone with mild displacement. Electronically Signed   By: Burman Nieves M.D.   On: 02/27/2021 21:34    All questions were answered. The patient knows to call the clinic with any problems, questions or concerns. I spent 45 minutes in the care of this patient including H and P, review of records, counseling and coordination of care.     Rachel Moulds, MD 03/16/2021 12:01 PM     This encounter was created in error - please disregard.

## 2021-03-22 ENCOUNTER — Ambulatory Visit: Payer: Medicaid Other | Attending: Neurosurgery

## 2021-03-22 ENCOUNTER — Other Ambulatory Visit: Payer: Self-pay

## 2021-03-22 DIAGNOSIS — M6281 Muscle weakness (generalized): Secondary | ICD-10-CM

## 2021-03-22 DIAGNOSIS — M6289 Other specified disorders of muscle: Secondary | ICD-10-CM | POA: Diagnosis present

## 2021-03-22 DIAGNOSIS — M545 Low back pain, unspecified: Secondary | ICD-10-CM

## 2021-03-22 DIAGNOSIS — M542 Cervicalgia: Secondary | ICD-10-CM | POA: Diagnosis not present

## 2021-03-23 NOTE — Therapy (Signed)
[facility] [address] [place], [place], [address] Phone: [phone]   Fax:  [phone]  Physical Therapy Treatment  Patient Details  Name: Tina Russell MRN: [id number] Date of Birth: 07/27/1980 Referring Provider (PT): Coletta Memos, MD   Encounter Date: 03/22/2021   PT End of Session - 03/22/21 0852     Visit Number 2    Number of Visits 10    Date for PT Re-Evaluation 04/27/21    Authorization Type [place] [employer]    Authorization - Visit Number 2    Authorization - Number of Visits 3    Progress Note Due on Visit 10    PT Start Time 0950    PT Stop Time 1030    PT Time Calculation (min) 40 min    Activity Tolerance Patient tolerated treatment well    Behavior During Therapy [facility] for tasks assessed/performed             Past Medical History:  Diagnosis Date   ADHD    ADHD    Anxiety    Bipolar disorder (HCC)    Cervical spinal stenosis 12/27/2020   C6-7 level   Chronic back pain    Common migraine with intractable migraine 12/27/2020   Depression    Family history of breast cancer    Family history of melanoma    Hypertension    Kidney failure    Migraine    Neuropathy    PTSD (post-traumatic stress disorder)     Past Surgical History:  Procedure Laterality Date   ANTERIOR CERVICAL DECOMP/DISCECTOMY FUSION N/A 01/28/2021   Procedure: Cervical Six-Seven Anterior Cervical Decompression/Discectomy/Fusion;  Surgeon: Coletta Memos, MD;  Location: [facility] OR;  Service: Neurosurgery;  Laterality: N/A;   DILATION AND CURETTAGE OF UTERUS     3 blood tranfusions    There were no vitals filed for this visit.   Subjective Assessment - 03/22/21 0859     Subjective Pt reports she has returned back to activity as tolerated. Pt reports she has alot of stress life. She notes she is having R lower neck/upper shoulder and mid back since surgery. The pain on the R is similar as to what she experienced with her L neck and  shoulder. Pt also has pain with her L lateral ribs and R low back following a slip/fall at a pool this summer. Pt notes she is scheduled additional MRIs for her neck and one for her L lateral ribs. Pt states she is not sleeping well due to her pain level which develops and increases while she is sleeping.    Pertinent History Significant PMH:  bipolar, PTSD, ADHD    Currently in Pain? Yes    Pain Score 1     Pain Location Neck    Pain Orientation Left    Pain Descriptors / Indicators Aching    Pain Type Chronic pain    Multiple Pain Sites Yes    Pain Score 3    Pain Location Neck   shoulder   Pain Orientation Right    Pain Descriptors / Indicators Aching;Throbbing    Pain Type Acute pain    Pain Onset More than a month ago                               [facility] Adult PT Treatment/Exercise - 03/23/21 0001       Exercises   Exercises Neck;Shoulder  Neck Exercises: Seated   Neck Retraction 10 reps;3 secs    Cervical Rotation 5 reps   3 sec   Cervical Rotation Limitations cervical retraction completed prior to rotation    Other Seated Exercise Scapular retractions, 10x, 3 sec      Neck Exercises: Stretches   Upper Trapezius Stretch Right;Left;2 reps;20 seconds    Levator Stretch Right;Left;2 reps;20 seconds                     PT Education - 03/23/21 0439     Education Details Updated HEP. Education for proper computer set up and posture and for sleeping positions and support for SL and supine. Advised pt against sleeping prone.    Person(s) Educated Patient    Methods Explanation    Comprehension Verbalized understanding              PT Short Term Goals - 02/16/21 1335       PT SHORT TERM GOAL #1   Title Tina Russell will be >75% HEP compliant throughout therapy to improve carryover between sessions and facilitate independent management of condition    Baseline no HEP    Status New    Target Date 03/09/21               PT  Long Term Goals - 02/16/21 1335       PT LONG TERM GOAL #1   Title Tina Russell will achieve 28 kg grip strength in bil UE  EVAL: L 25 kg, R 23 kg  target date: 04/27/2021    Baseline EVAL: L 25 kg, R 23 kg    Target Date 04/27/21      PT LONG TERM GOAL #2   Title Tina Russell will improve cervical rotation to >/= 45 degrees  EVAL: L: 30, R 15 degrees  target date: 04/27/2021    Baseline EVAL: L: 30, R 15 degrees    Target Date 04/27/21      PT LONG TERM GOAL #3   Title Tina Russell will improve the following MMTs to >/= 4/5 to show improvement in strength:  shoulder ER and flexion  EVAL: unable to test  target date: 04/27/2021    Baseline unable to test    Target Date 04/27/21      PT LONG TERM GOAL #4   Title Tina Russell will report >/= 70% decrease in pain from evaluation  EVAL: 10/10 max pain  target date: 04/27/2021    Baseline 10/10 pain    Target Date 04/27/21      PT LONG TERM GOAL #5   Title Tina Russell will improve NDI score to 20 (40% disability) as a proxy for functional improvement  EVAL: 40 (80% disability)  target date: 04/27/2021    Baseline 20 (40% disability)    Target Date 04/27/21                   Plan - 03/23/21 0450     Clinical Impression Statement With pt's concern re: her ability to have restful sleep due to the development of pain, education re: sleeping positions emphasizing neutral positions with proper support was provided. Additionally, proper sitting posture and computer set up education was also provided. Per pt's description, her computer set up sounds appropriate. pt is an Recruitment consultant. Pt voiced understanding of sleeping recommendations. Addtionally, pt HEP for postural strengthening was completed and reviewed and gentle stretching/ROM exs were added.  Pt returned proper demonstraion of the added exs. Pt tolerated the exs without adverse effects.    Stability/Clinical Decision Making Stable/Uncomplicated     Clinical Decision Making Low    Rehab Potential Good    PT Frequency 2x / week   10 weeks   PT Duration Other (comment)    PT Treatment/Interventions ADLs/Self Care Home Management;Aquatic Therapy;Iontophoresis 4mg /ml Dexamethasone;Moist Heat;Gait training;Therapeutic activities;Therapeutic exercise;Neuromuscular re-education;Balance training;Dry needling    PT Next Visit Plan Gentle AROM, periscapular progression, cervical progression as appropriate    PT Home Exercise Plan [id number]    Consulted and Agree with Plan of Care Patient             Patient will benefit from skilled therapeutic intervention in order to improve the following deficits and impairments:  Pain, Impaired UE functional use, Decreased strength, Decreased range of motion  Visit Diagnosis: Cervicalgia  Muscle tightness  Muscle weakness  Low back pain, unspecified back pain laterality, unspecified chronicity, unspecified whether sciatica present     Problem List Patient Active Problem List   Diagnosis Date Noted   Cervical spondylosis with radiculopathy 01/27/2021   Genetic testing 01/10/2021   Family history of breast cancer 12/28/2020   Family history of melanoma 12/28/2020   Cervical spinal stenosis 12/27/2020   Common migraine with intractable migraine 12/27/2020   12/29/2020 MS, PT 03/23/21 5:00 AM   [facility] [address] [place], [place], [place] Phone: [phone]   Fax:  [phone]  Name: Tina Russell MRN: [name] Date of Birth: January 27, 1981

## 2021-03-25 ENCOUNTER — Ambulatory Visit: Payer: Medicaid Other

## 2021-03-25 ENCOUNTER — Other Ambulatory Visit: Payer: Self-pay

## 2021-03-25 DIAGNOSIS — M6289 Other specified disorders of muscle: Secondary | ICD-10-CM

## 2021-03-25 DIAGNOSIS — M542 Cervicalgia: Secondary | ICD-10-CM | POA: Diagnosis not present

## 2021-03-25 DIAGNOSIS — M6281 Muscle weakness (generalized): Secondary | ICD-10-CM

## 2021-03-25 DIAGNOSIS — M545 Low back pain, unspecified: Secondary | ICD-10-CM

## 2021-03-26 NOTE — Therapy (Signed)
[facility] [address] [place], [place], [address] Phone: [phone]   Fax:  [phone]  Physical Therapy Treatment  Patient Details  Name: Tina Russell MRN: [id number] Date of Birth: 15-Jun-1980 Referring Provider (PT): Coletta Memos, MD   Encounter Date: 03/25/2021    Past Medical History:  Diagnosis Date   ADHD    ADHD    Anxiety    Bipolar disorder (HCC)    Cervical spinal stenosis 12/27/2020   C6-7 level   Chronic back pain    Common migraine with intractable migraine 12/27/2020   Depression    Family history of breast cancer    Family history of melanoma    Hypertension    Kidney failure    Migraine    Neuropathy    PTSD (post-traumatic stress disorder)     Past Surgical History:  Procedure Laterality Date   ANTERIOR CERVICAL DECOMP/DISCECTOMY FUSION N/A 01/28/2021   Procedure: Cervical Six-Seven Anterior Cervical Decompression/Discectomy/Fusion;  Surgeon: Coletta Memos, MD;  Location: [facility] OR;  Service: Neurosurgery;  Laterality: N/A;   DILATION AND CURETTAGE OF UTERUS     3 blood tranfusions    There were no vitals filed for this visit.   Subjective Assessment - 03/26/21 2221     Subjective Pt reports education on sleeping positions has been helpful in that she is sleeping some better, has various positions to try, and knowing having pain when sleeping is normal.    Currently in Pain? Yes    Pain Score 3     Pain Location Neck    Pain Orientation Left    Pain Descriptors / Indicators Aching    Pain Type Chronic pain    Pain Onset More than a month ago    Pain Frequency Constant    Pain Score 1    Pain Location Neck    Pain Orientation Right    Pain Descriptors / Indicators Aching    Pain Type Acute pain    Pain Onset More than a month ago    Pain Frequency Constant                               [facility] Adult PT Treatment/Exercise - 03/26/21 0001       Exercises    Exercises Neck;Shoulder      Neck Exercises: Machines for Strengthening   UBE (Upper Arm Bike) 3 mins; 1:30 forward, 1;30 backwards      Neck Exercises: Seated   Neck Retraction 10 reps;3 secs    Cervical Rotation 5 reps   3 sec   Cervical Rotation Limitations cervical retraction completed prior to rotation    Other Seated Exercise Thoracic ext c towel 5x. Pt reported increased mid back discomfort and the exs was [facility]    Other Seated Exercise Shoulder ER, red theraband, 12x. Shoulder horizonal shoulder abd , red theraband, 12x      Neck Exercises: Stretches   Upper Trapezius Stretch Right;Left;2 reps;20 seconds    Levator Stretch Right;Left;2 reps;20 seconds      Manual Therapy   Manual Therapy Soft tissue mobilization    Soft tissue mobilization STM and TP release for the Lt and Rt upper trap, and interscapular/thoracic paraspinals. increased muscle tension was noted Rt vs Lt.                       PT Short Term Goals - 02/16/21 1335  PT SHORT TERM GOAL #1   Title MORGIN HALLS will be >75% HEP compliant throughout therapy to improve carryover between sessions and facilitate independent management of condition    Baseline no HEP    Status New    Target Date 03/09/21               PT Long Term Goals - 02/16/21 1335       PT LONG TERM GOAL #1   Title RAINBOW SALMAN will achieve 28 kg grip strength in bil UE  EVAL: L 25 kg, R 23 kg  target date: 04/27/2021    Baseline EVAL: L 25 kg, R 23 kg    Target Date 04/27/21      PT LONG TERM GOAL #2   Title LAQUEISHA CATALINA will improve cervical rotation to >/= 45 degrees  EVAL: L: 30, R 15 degrees  target date: 04/27/2021    Baseline EVAL: L: 30, R 15 degrees    Target Date 04/27/21      PT LONG TERM GOAL #3   Title DASHAUNA HEYMANN will improve the following MMTs to >/= 4/5 to show improvement in strength:  shoulder ER and flexion  EVAL: unable to test  target date: 04/27/2021    Baseline unable to test     Target Date 04/27/21      PT LONG TERM GOAL #4   Title CASHAY MANGANELLI will report >/= 70% decrease in pain from evaluation  EVAL: 10/10 max pain  target date: 04/27/2021    Baseline 10/10 pain    Target Date 04/27/21      PT LONG TERM GOAL #5   Title ALYONA ROMACK will improve NDI score to 20 (40% disability) as a proxy for functional improvement  EVAL: 40 (80% disability)  target date: 04/27/2021    Baseline 20 (40% disability)    Target Date 04/27/21                    Patient will benefit from skilled therapeutic intervention in order to improve the following deficits and impairments:  Pain, Impaired UE functional use, Decreased strength, Decreased range of motion  Visit Diagnosis: Cervicalgia  Muscle weakness  Muscle tightness  Low back pain, unspecified back pain laterality, unspecified chronicity, unspecified whether sciatica present     Problem List Patient Active Problem List   Diagnosis Date Noted   Cervical spondylosis with radiculopathy 01/27/2021   Genetic testing 01/10/2021   Family history of breast cancer 12/28/2020   Family history of melanoma 12/28/2020   Cervical spinal stenosis 12/27/2020   Common migraine with intractable migraine 12/27/2020   Joellyn Rued MS, PT 03/26/21 10:26 PM   [facility] [address] [place], [place], [address] Phone: [phone]   Fax:  [phone]  Name: SHARLA TANKARD MRN: [id number] Date of Birth: 16-Dec-1980

## 2021-03-28 ENCOUNTER — Other Ambulatory Visit: Payer: Self-pay

## 2021-03-28 ENCOUNTER — Other Ambulatory Visit: Payer: Self-pay | Admitting: Neurosurgery

## 2021-03-28 ENCOUNTER — Ambulatory Visit: Payer: Medicaid Other

## 2021-03-28 DIAGNOSIS — M542 Cervicalgia: Secondary | ICD-10-CM | POA: Diagnosis not present

## 2021-03-28 DIAGNOSIS — M6289 Other specified disorders of muscle: Secondary | ICD-10-CM

## 2021-03-28 DIAGNOSIS — M545 Low back pain, unspecified: Secondary | ICD-10-CM

## 2021-03-28 DIAGNOSIS — M6281 Muscle weakness (generalized): Secondary | ICD-10-CM

## 2021-03-28 DIAGNOSIS — M5020 Other cervical disc displacement, unspecified cervical region: Secondary | ICD-10-CM

## 2021-03-29 NOTE — Therapy (Signed)
[facility] Outpatient Rehabilitation [facility] [address] [place], [place], [address] Phone: [phone]   Fax:  [phone]  Physical Therapy Treatment/Re-Auth Patient Details  Name: Tina Russell MRN: [id number] Date of Birth: 1981/01/26 Referring Provider (PT): Coletta Memos, MD   Encounter Date: 03/28/2021   PT End of Session - 03/28/21 0920     Visit Number 4    Number of Visits 20    Date for PT Re-Evaluation 04/27/21    Authorization Type [place] [employer]    Authorization - Visit Number 3    Authorization - Number of Visits 3    Progress Note Due on Visit 10    PT Start Time 0958    PT Stop Time 1054    PT Time Calculation (min) 56 min    Activity Tolerance Patient tolerated treatment well    Behavior During Therapy [facility] for tasks assessed/performed             Past Medical History:  Diagnosis Date   ADHD    ADHD    Anxiety    Bipolar disorder (HCC)    Cervical spinal stenosis 12/27/2020   C6-7 level   Chronic back pain    Common migraine with intractable migraine 12/27/2020   Depression    Family history of breast cancer    Family history of melanoma    Hypertension    Kidney failure    Migraine    Neuropathy    PTSD (post-traumatic stress disorder)     Past Surgical History:  Procedure Laterality Date   ANTERIOR CERVICAL DECOMP/DISCECTOMY FUSION N/A 01/28/2021   Procedure: Cervical Six-Seven Anterior Cervical Decompression/Discectomy/Fusion;  Surgeon: Coletta Memos, MD;  Location: [facility] OR;  Service: Neurosurgery;  Laterality: N/A;   DILATION AND CURETTAGE OF UTERUS     3 blood tranfusions    There were no vitals filed for this visit.   Subjective Assessment - 03/28/21 0900     Subjective Pt reports she is not having as good of a day with her forgetting to take her muscle relaxor this AM. She notes she hasn't been sleeping well, 3-4 hours per night. She reports reach back c R arm to hand somethng to her son in the back seat and experienced  an increase in R shoulder pain . Pt reports she is having more difficulty c swolling which her surgeon stated may be a long term issue. Pt reports completing her HEP daily.    Pertinent History Significant PMH:  bipolar, PTSD, ADHD    Currently in Pain? Yes    Pain Score 3     Pain Location Neck   upper shoulder, mid back   Pain Orientation Left    Pain Descriptors / Indicators Aching    Pain Type Chronic pain    Pain Onset More than a month ago    Pain Frequency Constant    Pain Score 2   8 c movement   Pain Location Shoulder    Pain Orientation Right    Pain Descriptors / Indicators Aching    Pain Type Acute pain    Pain Onset More than a month ago    Pain Frequency Constant                               [facility] Adult PT Treatment/Exercise - 03/29/21 0001       Exercises   Exercises Neck;Shoulder      Neck Exercises:  Seated   Neck Retraction 10 reps;3 secs    Cervical Rotation 5 reps   3 sec   Cervical Rotation Limitations cervical retraction completed prior to rotation    Other Seated Exercise Scapular retractions, 10x, 3 sec hold      Neck Exercises: Supine   Other Supine Exercise --      Neck Exercises: Stretches   Upper Trapezius Stretch Right;Left;3 reps;20 seconds    Levator Stretch Right;Left;2 reps;20 seconds      Modalities   Modalities Moist Heat      Moist Heat Therapy   Number Minutes Moist Heat 15 Minutes    Moist Heat Location Shoulder;Cervical      Manual Therapy   Manual Therapy Soft tissue mobilization    Soft tissue mobilization STM for the Lt and Rt upper trap, levator scapulae, and interscapular/thoracic paraspinals. increased muscle tension was noted Rt vs Lt.                       PT Short Term Goals - 03/29/21 0602       PT SHORT TERM GOAL #1   Title TASHEENA WAMBOLT will be >75% HEP compliant throughout therapy to improve carryover between sessions and facilitate independent management of condition    Status  Achieved    Target Date 03/29/21               PT Long Term Goals - 03/29/21 0602       PT LONG TERM GOAL #1   Title TEREN FRANCKOWIAK will achieve 28 kg grip strength in bil UE  EVAL: L 25 kg, R 23 kg  target date: 04/27/2021    Baseline EVAL: L 25 kg, R 23 kg    Status On-going    Target Date 05/27/21      PT LONG TERM GOAL #2   Title LIYANA SUNIGA will improve cervical rotation to >/= 45 degrees  EVAL: L: 30, R 15 degrees  target date: 04/27/2021    Baseline EVAL: L: 30, R 15 degrees    Status On-going    Target Date 05/27/21      PT LONG TERM GOAL #3   Title CHEREE FOWLES will improve the following MMTs to >/= 4/5 to show improvement in strength:  shoulder ER and flexion  EVAL: unable to test  target date: 04/27/2021    Baseline unable to test    Status On-going    Target Date 05/27/21      PT LONG TERM GOAL #4   Title PATRIC VANPELT will report >/= 70% decrease in pain from evaluation  EVAL: 10/10 max pain  target date: 04/27/2021. 03/28/21: 1-3/10 for L lower cervical/upper shoulder pain    Baseline 10/10 pain    Status On-going      PT LONG TERM GOAL #5   Title PAYDEN BONUS will improve NDI score to 20 (40% disability) as a proxy for functional improvement  EVAL: 40 (80% disability)  target date: 04/27/2021    Baseline 20 (40% disability)    Status On-going    Target Date 05/27/21                   Plan - 03/29/21 0601     Clinical Impression Statement PT was completed for STM of the upper traps, levator scapulae, and interscapular muculature f/b cervical and upper shoulder postural and ROM and stretching exs. Pt completed the ther ex properly. Delbert Harness  postural/posterior chain exs were not completed today with pt reporting aggrevating her Rt shoulder with reaching behind her to hand her son something in the back seat. Overall, pt's pain level is improved, but with forgetting to take her muscle relaxor this Am, she reporting more pain and  tightness today. Pt will continue to benefit from PT 2w8 to decrease pain, increase ROM and strength in order to optimize function and return to PLOF.    Stability/Clinical Decision Making Stable/Uncomplicated    Clinical Decision Making Low    Rehab Potential Good    PT Frequency 2x / week    PT Duration 8 weeks    PT Treatment/Interventions ADLs/Self Care Home Management;Aquatic Therapy;Iontophoresis 4mg /ml Dexamethasone;Moist Heat;Gait training;Therapeutic activities;Therapeutic exercise;Neuromuscular re-education;Balance training;Dry needling    PT Next Visit Plan Gentle AROM, periscapular progression, cervical progression as appropriate    PT Home Exercise Plan [id number]    Consulted and Agree with Plan of Care Patient             Patient will benefit from skilled therapeutic intervention in order to improve the following deficits and impairments:  Pain, Impaired UE functional use, Decreased strength, Decreased range of motion  Visit Diagnosis: Cervicalgia  Muscle weakness  Muscle tightness  Low back pain, unspecified back pain laterality, unspecified chronicity, unspecified whether sciatica present     Problem List Patient Active Problem List   Diagnosis Date Noted   Cervical spondylosis with radiculopathy 01/27/2021   Genetic testing 01/10/2021   Family history of breast cancer 12/28/2020   Family history of melanoma 12/28/2020   Cervical spinal stenosis 12/27/2020   Common migraine with intractable migraine 12/27/2020   12/29/2020 MS, PT 03/29/21 6:27 AM   [facility] Outpatient Rehabilitation [facility] [address] [place], [place], [place] Phone: [phone]   Fax:  [phone]  Name: TEQUILA ROTTMANN MRN: [name] Date of Birth: 30-Jan-1981

## 2021-04-04 LAB — POC URINE PREG, ED

## 2021-04-05 ENCOUNTER — Ambulatory Visit (INDEPENDENT_AMBULATORY_CARE_PROVIDER_SITE_OTHER): Payer: Medicaid Other | Admitting: Nurse Practitioner

## 2021-04-05 ENCOUNTER — Encounter: Payer: Self-pay | Admitting: Nurse Practitioner

## 2021-04-05 ENCOUNTER — Other Ambulatory Visit: Payer: Self-pay

## 2021-04-05 VITALS — BP 132/97 | HR 62 | Resp 18

## 2021-04-05 DIAGNOSIS — R0789 Other chest pain: Secondary | ICD-10-CM | POA: Diagnosis not present

## 2021-04-05 MED ORDER — TIZANIDINE HCL 4 MG PO TABS: 4.0000 mg | ORAL_TABLET | Freq: Four times a day (QID) | ORAL | 0 refills | Status: DC | PRN

## 2021-04-05 NOTE — Assessment & Plan Note (Signed)
Intermittent Chest Pain / pressure:  Please keep appointment with neurology for further evaluation - this could be nerve type pain  Continue current medications  EKG in office today: NSR  Will reorder tizanidine   Anxiety:  Continue to follow with psychiatry and counseling   Follow up:  Follow up with PCP in 1-2 weeks 

## 2021-04-05 NOTE — Patient Instructions (Addendum)
Intermittent Chest Pain / pressure:  Please keep appointment with neurology for further evaluation - this could be nerve type pain  Continue current medications  EKG in office today: NSR  Will reorder tizanidine   Anxiety:  Continue to follow with psychiatry and counseling   Follow up:  Follow up with PCP in 1-2 weeks

## 2021-04-05 NOTE — Progress Notes (Signed)
@Patient  ID: , adult    DOB: 1981/05/01, 40 y.o.   MRN: [id number]  Chief Complaint  Patient presents with   Chest Pain    Referring provider: [id number], MD   HPI  Patient presents today for intermittent chest pain.  She states that she did recently have spinal surgery.  She states that she is experiencing stabbing pains to the left side of her chest and around her back under her shoulder blade.  We discussed that this could be nerve related to recent surgery.  She also states that she has been under a lot of anxiety recently because her and her partner have separated.  She has been experiencing this intermittent chest pain since around the time that the separation occurred.  Patient does currently see a psychiatrist and a Georganna Skeans.  EKG in office today shows normal sinus rhythm.  Vital signs are stable in office today. Denies f/c/s, n/v/d, hemoptysis, PND, or edema.     Allergies  Allergen Reactions   Amoxicillin-Pot Clavulanate Rash   Strattera [Atomoxetine Hcl] Hypertension    Immunization History  Administered Date(s) Administered   PFIZER(Purple Top)SARS-COV-2 Vaccination 09/26/2019    Past Medical History:  Diagnosis Date   ADHD    ADHD    Anxiety    Bipolar disorder (HCC)    Cervical spinal stenosis 12/27/2020   C6-7 level   Chronic back pain    Common migraine with intractable migraine 12/27/2020   Depression    Family history of breast cancer    Family history of melanoma    Hypertension    Kidney failure    Migraine    Neuropathy    PTSD (post-traumatic stress disorder)     Tobacco History: Social History   Tobacco Use  Smoking Status Former   Types: E-cigarettes   Quit date: 01/20/2021   Years since quitting: 0.2  Smokeless Tobacco Never   Counseling given: Yes   Outpatient Encounter Medications as of 04/05/2021  Medication Sig   cloNIDine (CATAPRES) 0.1 MG tablet Take 0.1 mg by mouth 2 (two) times daily.   gabapentin  (NEURONTIN) 300 MG capsule Take 300 mg by mouth 3 (three) times daily.   lamoTRIgine (LAMICTAL) 200 MG tablet Take 200 mg by mouth at bedtime.   levonorgestrel (MIRENA) 20 MCG/24HR IUD by Intrauterine route.   oxyCODONE-acetaminophen (PERCOCET) 5-325 MG tablet Take 1 tablet by mouth every 4 (four) hours as needed for severe pain. (Patient not taking: Reported on 03/16/2021)   tiZANidine (ZANAFLEX) 4 MG tablet Take 1 tablet (4 mg total) by mouth every 6 (six) hours as needed for muscle spasms.   [DISCONTINUED] tiZANidine (ZANAFLEX) 4 MG tablet Take 4 mg by mouth every 6 (six) hours as needed for muscle spasms.   No facility-administered encounter medications on file as of 04/05/2021.     Review of Systems  Review of Systems  Constitutional: Negative.   HENT: Negative.    Cardiovascular:  Positive for chest pain.  Gastrointestinal: Negative.   Allergic/Immunologic: Negative.   Neurological: Negative.   Psychiatric/Behavioral:  The patient is nervous/anxious.       Physical Exam  BP (!) 132/97   Pulse 62   Resp 18   SpO2 96%   Wt Readings from Last 5 Encounters:  03/16/21 150 lb 3 oz (68.1 kg)  01/28/21 150 lb (68 kg)  01/24/21 154 lb 1 oz (69.9 kg)  12/27/20 150 lb (68 kg)     Physical Exam Vitals and nursing note  reviewed.  Constitutional:      General: Tina Russell is not in acute distress.    Appearance: Tina Russell is well-developed.  Cardiovascular:     Rate and Rhythm: Normal rate and regular rhythm.  Pulmonary:     Effort: Pulmonary effort is normal.     Breath sounds: Normal breath sounds.  Skin:    General: Skin is warm and dry.  Neurological:     Mental Status: Tina Russell is alert and oriented to person, place, and time.     Lab Results:  CBC    Component Value Date/Time   WBC 7.5 01/27/2021 1553   RBC 4.31 01/27/2021 1553   HGB 13.4 01/27/2021 1553   HCT 40.1 01/27/2021 1553   PLT 217 01/27/2021 1553   MCV 93.0 01/27/2021 1553   MCH 31.1  01/27/2021 1553   MCHC 33.4 01/27/2021 1553   RDW 12.6 01/27/2021 1553   LYMPHSABS 0.8 01/27/2021 1553   MONOABS 0.1 01/27/2021 1553   EOSABS 0.0 01/27/2021 1553   BASOSABS 0.0 01/27/2021 1553    BMET    Component Value Date/Time   NA 137 01/27/2021 1305   K 4.2 01/27/2021 1305   CL 105 01/27/2021 1305   CO2 25 01/27/2021 1305   GLUCOSE 93 01/27/2021 1305   BUN 9 01/27/2021 1305   CREATININE 0.81 01/27/2021 1305   CALCIUM 8.7 (L) 01/27/2021 1305   GFRNONAA >60 01/27/2021 1305     Assessment & Plan:   Intermittent left-sided chest pain Intermittent Chest Pain / pressure:  Please keep appointment with neurology for further evaluation - this could be nerve type pain  Continue current medications  EKG in office today: NSR  Will reorder tizanidine   Anxiety:  Continue to follow with psychiatry and counseling   Follow up:  Follow up with PCP in 1-2 weeks     Ivonne Andrew, NP 04/05/2021

## 2021-04-06 ENCOUNTER — Ambulatory Visit: Payer: Medicaid Other

## 2021-04-06 DIAGNOSIS — M542 Cervicalgia: Secondary | ICD-10-CM

## 2021-04-06 DIAGNOSIS — M6289 Other specified disorders of muscle: Secondary | ICD-10-CM

## 2021-04-06 DIAGNOSIS — M6281 Muscle weakness (generalized): Secondary | ICD-10-CM

## 2021-04-06 DIAGNOSIS — M545 Low back pain, unspecified: Secondary | ICD-10-CM

## 2021-04-06 NOTE — Therapy (Signed)
[facility] Outpatient Rehabilitation [facility] [address] [place], [place], [address] Phone: [phone]   Fax:  [phone]  Physical Therapy Treatment  Patient Details  Name: Tina Russell MRN: [id number] Date of Birth: 05-18-1981 Referring Provider (PT): Coletta Memos, MD   Encounter Date: 04/06/2021   PT End of Session - 04/06/21 1254     Visit Number 5    Number of Visits 20    Date for PT Re-Evaluation 04/27/21    Authorization Type [place] MCD    Progress Note Due on Visit 10    PT Start Time 0847    PT Stop Time 0930    PT Time Calculation (min) 43 min    Activity Tolerance Patient tolerated treatment well    Behavior During Therapy [facility] for tasks assessed/performed             Past Medical History:  Diagnosis Date   ADHD    ADHD    Anxiety    Bipolar disorder (HCC)    Cervical spinal stenosis 12/27/2020   C6-7 level   Chronic back pain    Common migraine with intractable migraine 12/27/2020   Depression    Family history of breast cancer    Family history of melanoma    Hypertension    Kidney failure    Migraine    Neuropathy    PTSD (post-traumatic stress disorder)     Past Surgical History:  Procedure Laterality Date   ANTERIOR CERVICAL DECOMP/DISCECTOMY FUSION N/A 01/28/2021   Procedure: Cervical Six-Seven Anterior Cervical Decompression/Discectomy/Fusion;  Surgeon: Coletta Memos, MD;  Location: [facility] OR;  Service: Neurosurgery;  Laterality: N/A;   DILATION AND CURETTAGE OF UTERUS     3 blood tranfusions    There were no vitals filed for this visit.   Subjective Assessment - 04/06/21 0852     Subjective Pt reports she is not taking the muscle relaxor and she is noticing more lower cervical/upper shoulder pain, but she is OK with the pain level with less medication.    Pertinent History Significant PMH:  bipolar, PTSD, ADHD    Currently in Pain? Yes    Pain Score 4     Pain Location Neck    Pain Orientation Left    Pain  Descriptors / Indicators Aching    Pain Type Chronic pain    Pain Onset More than a month ago    Pain Frequency Constant    Multiple Pain Sites No    Pain Score 2    Pain Location Shoulder    Pain Orientation Right    Pain Descriptors / Indicators Aching    Pain Onset More than a month ago    Pain Frequency Intermittent                               [facility] Adult PT Treatment/Exercise - 04/06/21 0001       Exercises   Exercises Neck;Shoulder      Neck Exercises: Seated   Neck Retraction 10 reps;3 secs    Neck Retraction Limitations verbal cue for proper technique      Neck Exercises: Stretches   Upper Trapezius Stretch Right;3 reps;20 seconds    Levator Stretch Right;2 reps;20 seconds      Shoulder Exercises: Standing   Protraction Both;10 reps    Theraband Level (Shoulder Protraction) Level 2 (Red)    External Rotation Both;10 reps    Theraband Level (Shoulder  External Rotation) Level 2 (Red)    Internal Rotation 10 reps;Right;Left    Theraband Level (Shoulder Internal Rotation) Level 2 (Red)      Shoulder Exercises: ROM/Strengthening   Nustep 5 mins, L3, arms/legs    Other ROM/Strengthening Exercises [place]ay pec stretch 60d, x3, 20 sec                     PT Education - 04/06/21 1253     Education Details updated HEP with 60d pectoral stretch, serratus and shoulder IR strengthneing    Person(s) Educated Patient    Methods Explanation;Demonstration;Tactile cues;Verbal cues    Comprehension Verbalized understanding;Returned demonstration;Verbal cues required;Tactile cues required              PT Short Term Goals - 03/29/21 0602       PT SHORT TERM GOAL #1   Title Darron Doom Zwicker will be >75% HEP compliant throughout therapy to improve carryover between sessions and facilitate independent management of condition    Status Achieved    Target Date 03/29/21               PT Long Term Goals - 03/29/21 0602       PT LONG TERM  GOAL #1   Title MCKYNNA VANLOAN will achieve 28 kg grip strength in bil UE  EVAL: L 25 kg, R 23 kg  target date: 04/27/2021    Baseline EVAL: L 25 kg, R 23 kg    Status On-going    Target Date 05/27/21      PT LONG TERM GOAL #2   Title JESSICALYNN DESHONG will improve cervical rotation to >/= 45 degrees  EVAL: L: 30, R 15 degrees  target date: 04/27/2021    Baseline EVAL: L: 30, R 15 degrees    Status On-going    Target Date 05/27/21      PT LONG TERM GOAL #3   Title LAVERTA HARNISCH will improve the following MMTs to >/= 4/5 to show improvement in strength:  shoulder ER and flexion  EVAL: unable to test  target date: 04/27/2021    Baseline unable to test    Status On-going    Target Date 05/27/21      PT LONG TERM GOAL #4   Title LONDEN LORGE will report >/= 70% decrease in pain from evaluation  EVAL: 10/10 max pain  target date: 04/27/2021. 03/28/21: 1-3/10 for L lower cervical/upper shoulder pain    Baseline 10/10 pain    Status On-going      PT LONG TERM GOAL #5   Title AMIRIA ORRISON will improve NDI score to 20 (40% disability) as a proxy for functional improvement  EVAL: 40 (80% disability)  target date: 04/27/2021    Baseline 20 (40% disability)    Status On-going    Target Date 05/27/21                   Plan - 04/06/21 1255     Clinical Impression Statement Assessed pt's L lateral side pain. Pain was provoked c resisted shoulder IR and with protraction, TTP of the teres major and serratus. pt was able to tolerate stretches of the teres major, but she did tolerate light strengthening which was completed and added to the pt's HEP. Pt did tolerate pectoral stretching in doorway at 60d. Attempted stretchig of the L lower cervical/upper shoulder muscle, but this was limited by R cervical pain. pt reports waking up with  a crick this AM. Pt tolerated the session without adverse effects. Will continue ROM/flexibility and strengthening of the neck and shoulder girdle  for improved function and QOL.    Stability/Clinical Decision Making Stable/Uncomplicated    Clinical Decision Making Low    Rehab Potential Good    PT Frequency 2x / week    PT Duration 8 weeks    PT Treatment/Interventions ADLs/Self Care Home Management;Aquatic Therapy;Iontophoresis 4mg /ml Dexamethasone;Moist Heat;Gait training;Therapeutic activities;Therapeutic exercise;Neuromuscular re-education;Balance training;Dry needling    PT Next Visit Plan Gentle AROM, periscapular progression, cervical progression as appropriate    PT Home Exercise Plan [id number]    Consulted and Agree with Plan of Care Patient             Patient will benefit from skilled therapeutic intervention in order to improve the following deficits and impairments:  Pain, Impaired UE functional use, Decreased strength, Decreased range of motion  Visit Diagnosis: Cervicalgia  Muscle weakness  Muscle tightness  Low back pain, unspecified back pain laterality, unspecified chronicity, unspecified whether sciatica present     Problem List Patient Active Problem List   Diagnosis Date Noted   Intermittent left-sided chest pain 04/05/2021   Cervical spondylosis with radiculopathy 01/27/2021   Genetic testing 01/10/2021   Family history of breast cancer 12/28/2020   Family history of melanoma 12/28/2020   Cervical spinal stenosis 12/27/2020   Common migraine with intractable migraine 12/27/2020   12/29/2020 MS, PT 04/06/21 1:18 PM   [facility] Outpatient Rehabilitation [facility] [address] [place], [place], [place] Phone: [phone]   Fax:  [phone]  Name: VALORA NORELL MRN: [name] Date of Birth: May 24, 1981

## 2021-04-08 ENCOUNTER — Ambulatory Visit
Admission: RE | Admit: 2021-04-08 | Discharge: 2021-04-08 | Disposition: A | Discharge status: Home / Self Care | Payer: Medicaid Other | Source: Ambulatory Visit | Attending: Neurosurgery | Admitting: Neurosurgery

## 2021-04-08 ENCOUNTER — Other Ambulatory Visit: Payer: Self-pay

## 2021-04-08 DIAGNOSIS — M5020 Other cervical disc displacement, unspecified cervical region: Secondary | ICD-10-CM

## 2021-04-08 IMAGING — MR MR CERVICAL SPINE W/O CM
4 of 5 series · 28 of 48 positions shown · non-contrast
Comparison: Prior radiograph from [DATE] as well as previous
MRI from [DATE].

CLINICAL DATA: Initial evaluation for right shoulder pain and arm
pain since cervical fusion on [REDACTED], finger numbness.

EXAM:
MRI CERVICAL SPINE WITHOUT CONTRAST
TECHNIQUE: Multiplanar, multisequence MR imaging of the cervical spine was
performed. No intravenous contrast was administered.

[Series 3: T1 · sagittal · 3.0mm · 0.41mm/px · 8 of 15 slices shown]
[im 1/15]
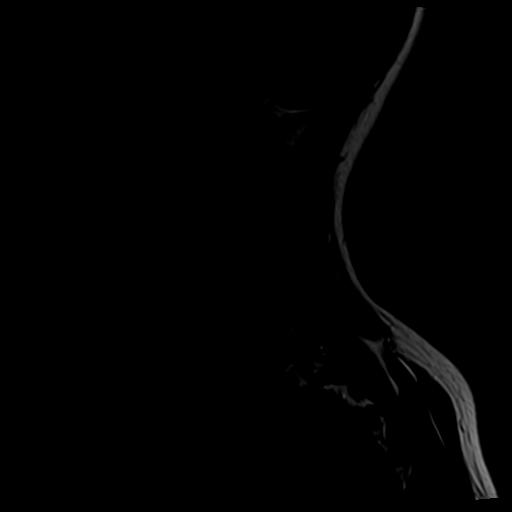
[im 3/15]
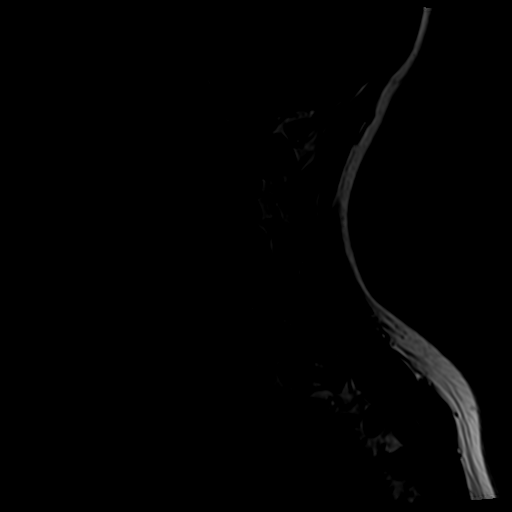
[im 5/15]
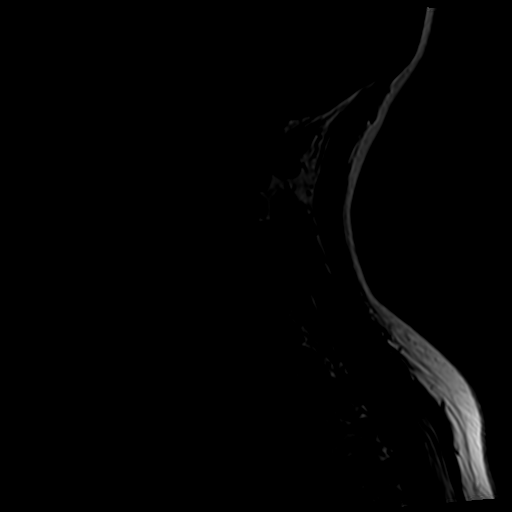
[im 7/15]
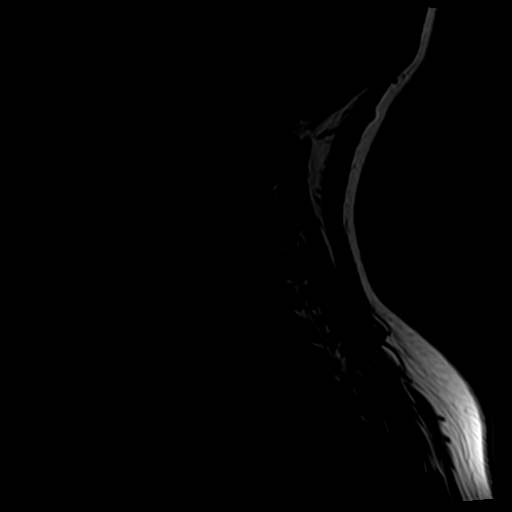
[im 9/15]
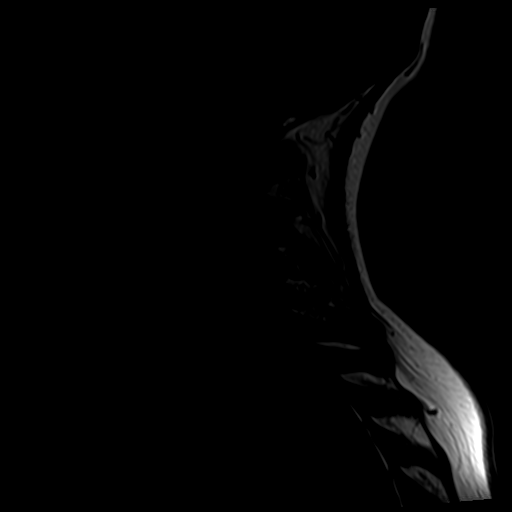
[im 11/15]
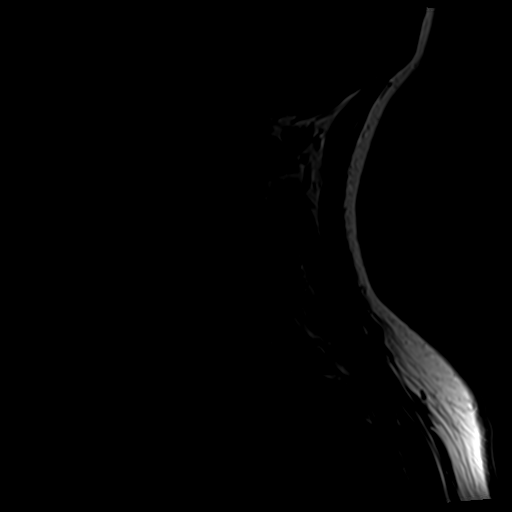
[im 13/15]
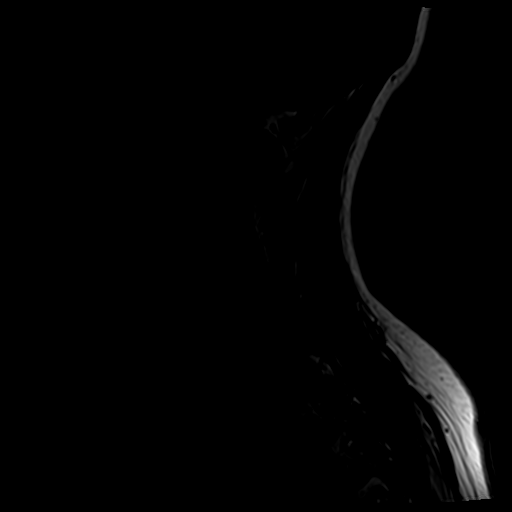
[im 15/15]
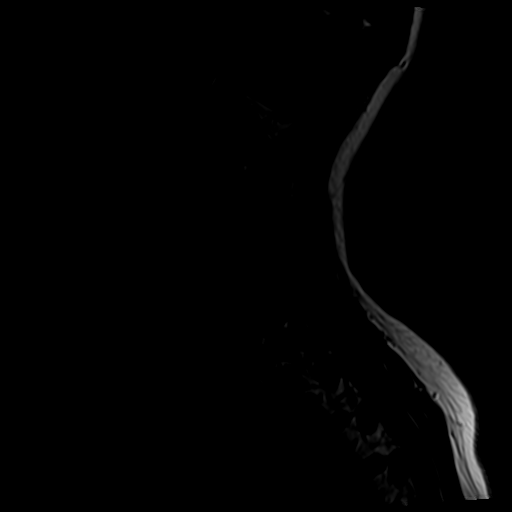

[Series 4: tir sag · sagittal · 3.0mm · 0.41mm/px · 4 of 15 slices shown]
[im 1/15]
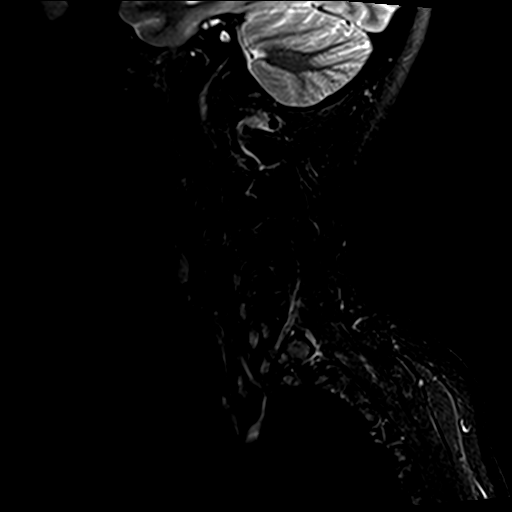
[im 3/15]
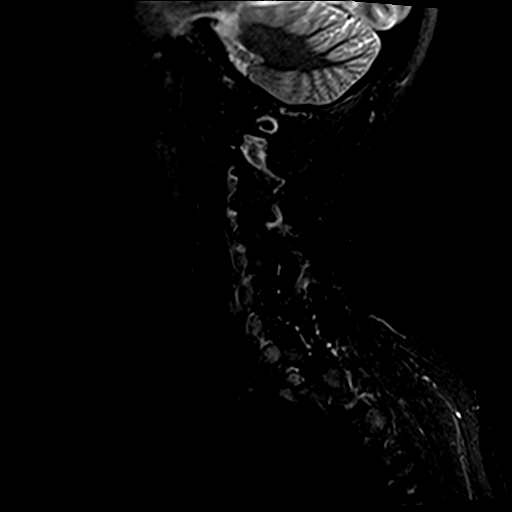
[im 8/15]
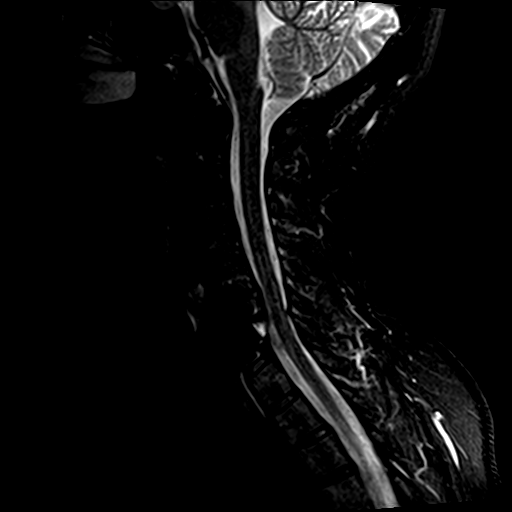
[im 12/15]
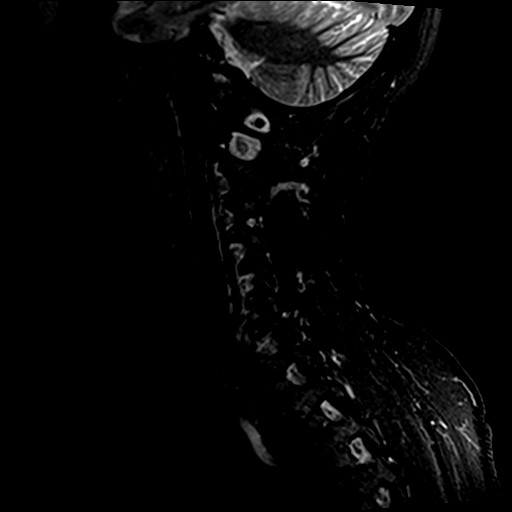

[Series 6: T2 · axial · 3.0mm · 0.70mm/px · z∈[-29,+64]mm · 9 of 26 slices shown (1 of 2)]
[im 1/26]
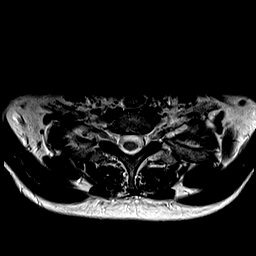
[im 5/26]
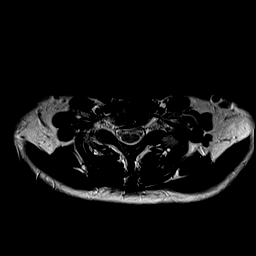
[im 9/26]
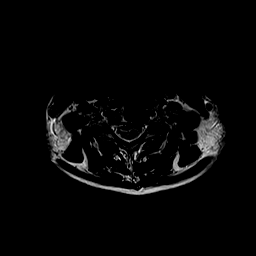
[im 11/26]
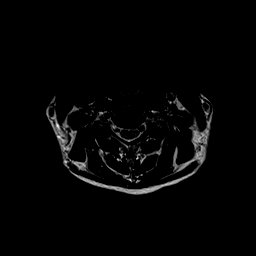
[im 13/26]
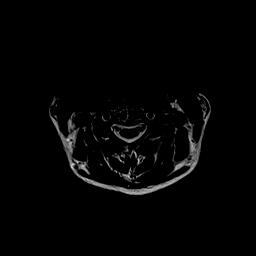
[im 15/26]
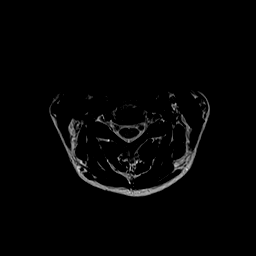
[im 17/26]
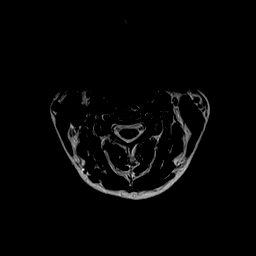
[im 21/26]
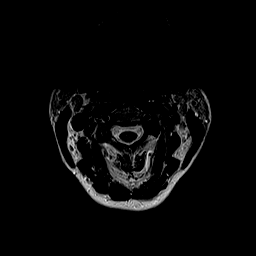
[im 26/26]
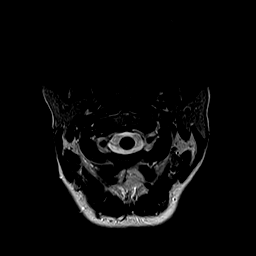

[Series 7: T2 · sagittal · 3.0mm · 0.66mm/px · 7 of 15 slices shown (2 of 2)]
[im 1/15]
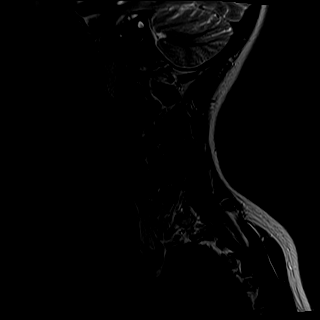
[im 3/15]
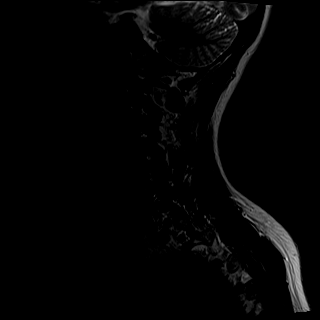
[im 5/15]
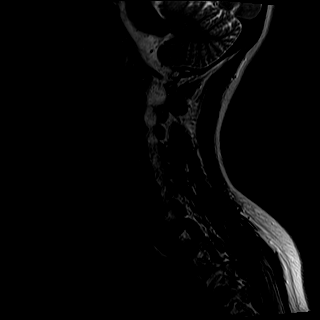
[im 8/15]
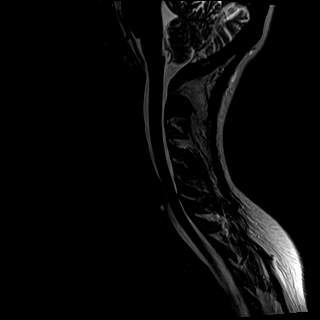
[im 10/15]
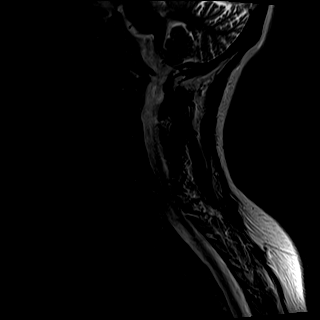
[im 12/15]
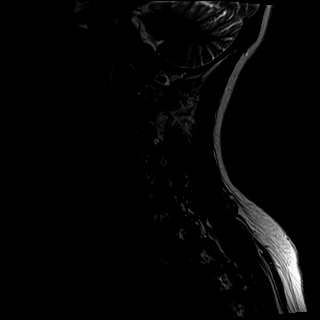
[im 15/15]
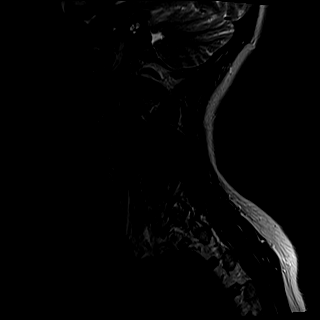

[28 of 48 positions shown; findings below may reference images not displayed]

FINDINGS: Alignment: Straightening with slight reversal of the normal cervical
lordosis, apex at C5-6. Trace retrolisthesis of C5 on C6 noted as
well.

Vertebrae: Susceptibility artifact related to prior ACDF at C6-7.
Vertebral body height maintained without acute or chronic fracture.
Bone marrow signal intensity within normal limits. No discrete or
worrisome osseous lesions. No abnormal marrow edema.

Cord: Normal signal morphology.

Posterior Fossa, vertebral arteries, paraspinal tissues: Visualized
brain and posterior fossa within normal limits. Craniocervical
junction normal. Paraspinous and prevertebral soft tissues within
normal limits. Normal intravascular flow voids seen within the
vertebral arteries bilaterally.

Disc levels:

C2-C3: Unremarkable.

C3-C4:  Unremarkable.

C4-C5:  Unremarkable.

C5-C6: Broad-based central to left paracentral disc protrusion
indents and partially faces the ventral thecal sac, asymmetric to
the left (series 6, image 16). Mild flattening of the ventral cord
without cord signal changes. Mild spinal stenosis. Foramina remain
patent. Appearance is mildly progressed from previous.

C6-C7: Interval ACDF. Residual right subarticular osseous spurring
indents the right ventral thecal sac, contacting and flattening the
right hemicord (series 6, image 19). No cord signal changes. Mild to
moderate right-sided spinal stenosis. Residual uncovertebral
spurring with mild bilateral C7 foraminal narrowing.

C7-T1:  Unremarkable.

Visualized upper thoracic spine demonstrates no significant finding.
IMPRESSION: 1. Interval ACDF at C6-7. Residual right subarticular osseous
spurring with resultant mild to moderate right-sided spinal stenosis
and flattening of the right hemi cord, but no cord signal changes.
2. Broad central to left paracentral disc protrusion at C5-6 with
resultant mild spinal stenosis and flattening of the ventral cord.
Appearance is mildly progressed from previous.

## 2021-04-10 ENCOUNTER — Other Ambulatory Visit: Payer: Self-pay

## 2021-04-10 ENCOUNTER — Ambulatory Visit (HOSPITAL_COMMUNITY)
Admission: RE | Admit: 2021-04-10 | Discharge: 2021-04-10 | Disposition: A | Discharge status: Home / Self Care | Payer: Medicaid Other | Source: Ambulatory Visit | Attending: Hematology and Oncology | Admitting: Hematology and Oncology

## 2021-04-10 DIAGNOSIS — Z9189 Other specified personal risk factors, not elsewhere classified: Secondary | ICD-10-CM | POA: Insufficient documentation

## 2021-04-10 DIAGNOSIS — Z803 Family history of malignant neoplasm of breast: Secondary | ICD-10-CM | POA: Diagnosis present

## 2021-04-10 DIAGNOSIS — N6314 Unspecified lump in the right breast, lower inner quadrant: Secondary | ICD-10-CM | POA: Insufficient documentation

## 2021-04-10 IMAGING — MR MR BREAST BILAT WO/W CM
8 of 12 series · 28 of 48 positions shown · IV contrast (7ml GADAVIST)
Comparison: No previous breast MRI.

CLINICAL DATA: Lifetime risk for breast cancer greater than 20% by
KAMLESH model, measuring 26%.

History of benign ultrasound-guided LEFT breast biopsy in [DATE] with pathology result of fibroadenoma.
LABS:  Not performed at imaging site.
EXAM:
BILATERAL BREAST MRI WITH AND WITHOUT CONTRAST
TECHNIQUE: Multiplanar, multisequence MR images of both breasts were obtained
prior to and following the intravenous administration of 7 ml of
Gadavist

[Series 2: T2 · axial · 3.0mm · 0.89mm/px · 1 of 68 slices shown]
[im 1/68]
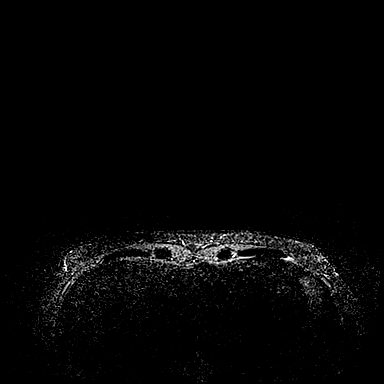

[Series 3: T1 fat-sat · axial · 1.2mm · 0.76mm/px · z∈[-111,+99]mm · 5 of 176 slices shown (1 of 4)]
[im 1/176]
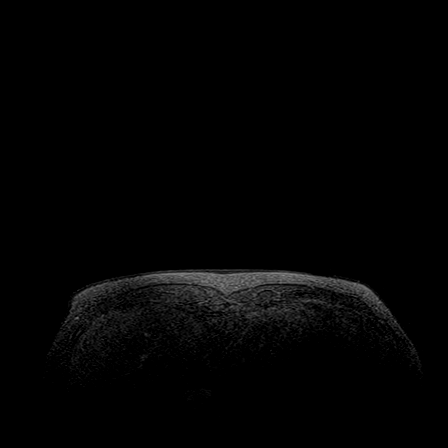
[im 44/176]
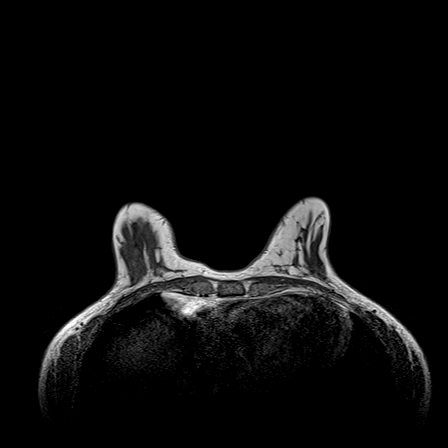
[im 88/176]
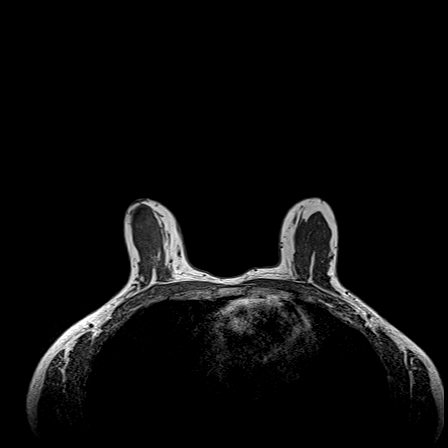
[im 132/176]
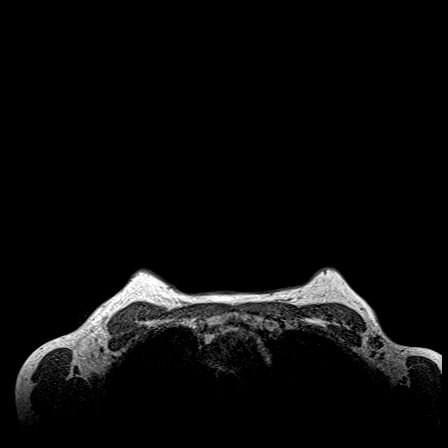
[im 176/176]
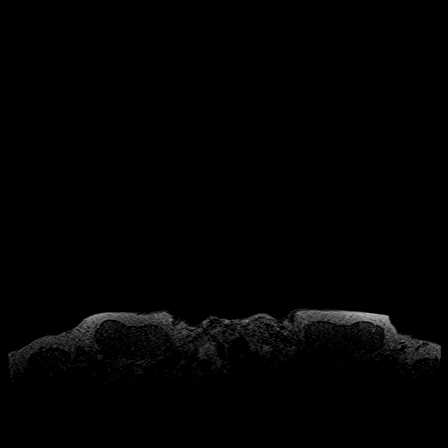

[Series 4: T1 fat-sat · axial · 1.2mm · 0.82mm/px · z∈[-101,+89]mm · 5 of 160 slices shown (2 of 4)]
[im 1/160]
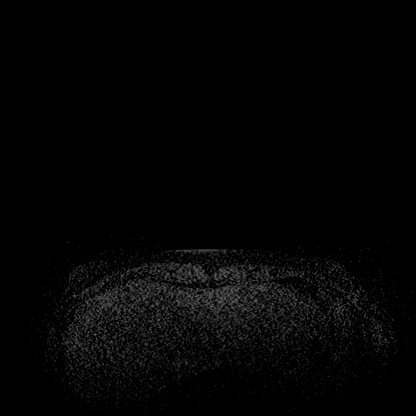
[im 40/160]
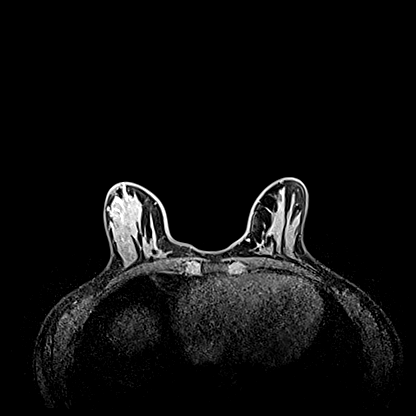
[im 80/160]
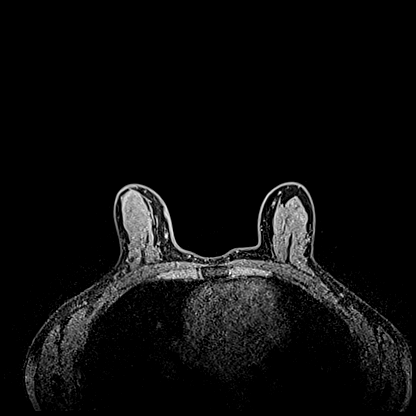
[im 120/160]
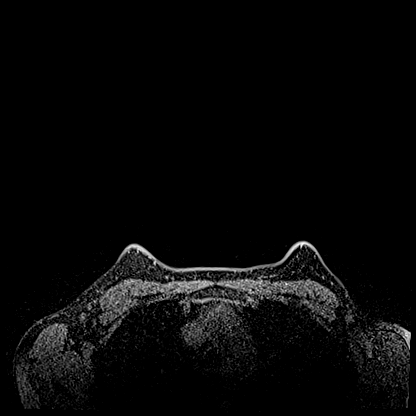
[im 160/160]
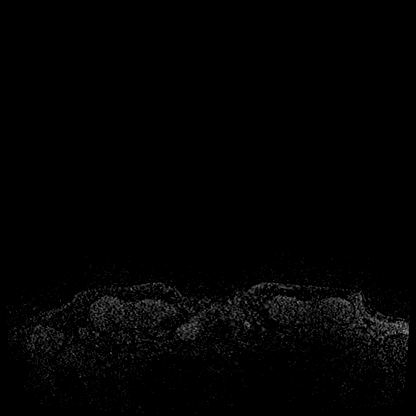

[Series 5: T1 fat-sat · axial · 1.2mm · 0.82mm/px · z∈[-101,+89]mm · 5 of 160 slices shown (3 of 4)]
[im 1/160]
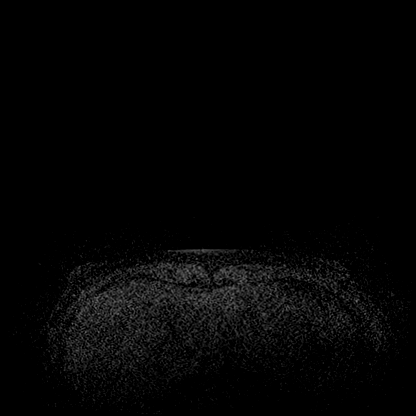
[im 40/160]
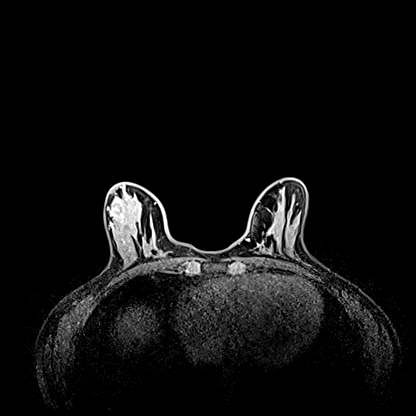
[im 80/160]
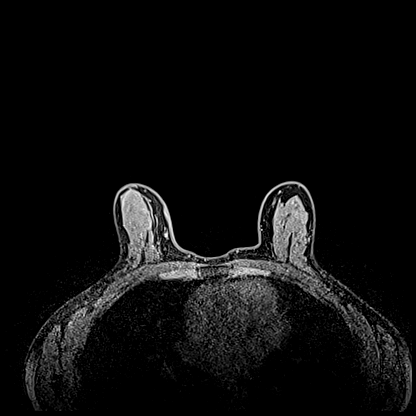
[im 120/160]
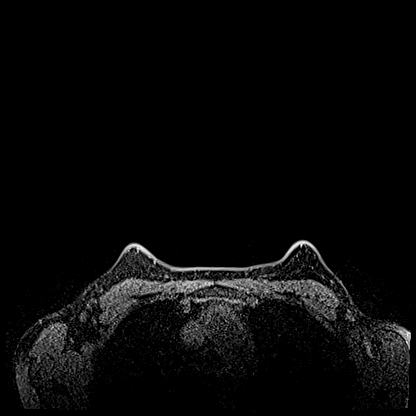
[im 160/160]
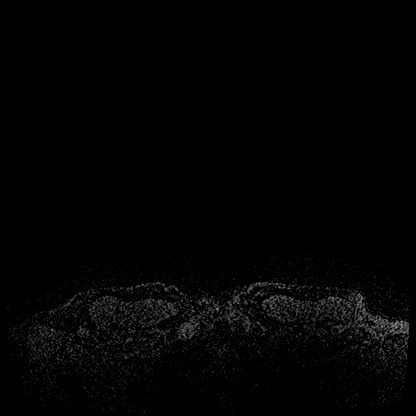

[Series 6: T1 fat-sat · axial · 1.2mm · 0.82mm/px · z∈[-101,+89]mm · 5 of 160 slices shown (4 of 4)]
[im 1/160]
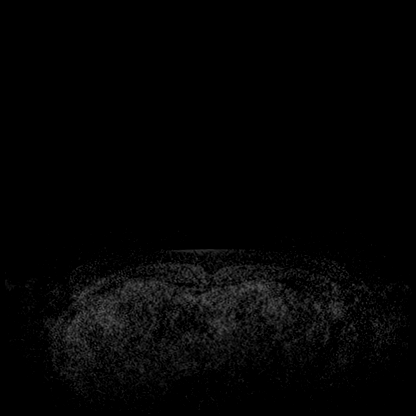
[im 40/160]
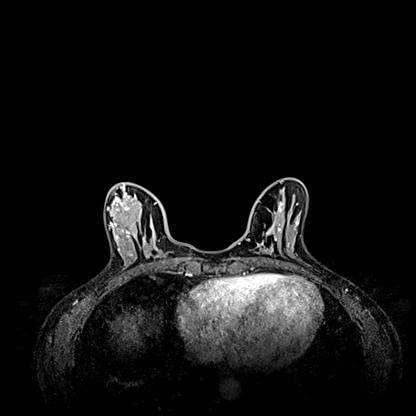
[im 80/160]
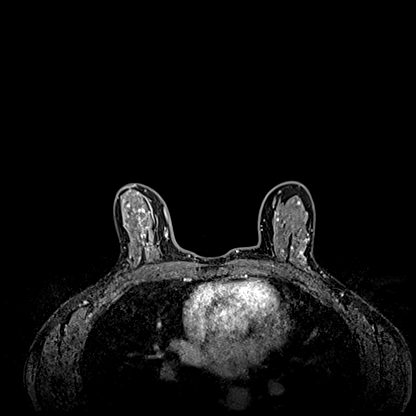
[im 120/160]
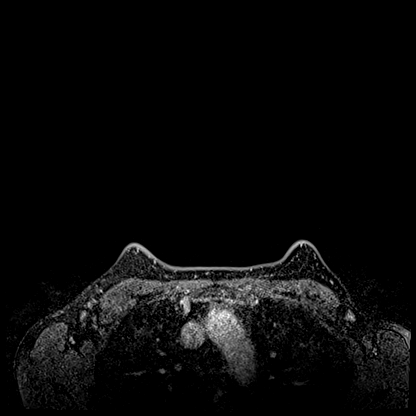
[im 160/160]
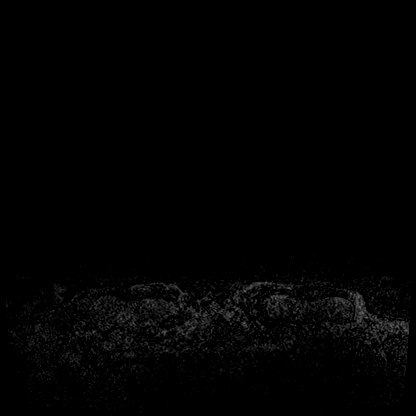

[Series 7: T1 · axial · 1.2mm · 0.82mm/px · z∈[-101,+89]mm · 5 of 160 slices shown (1 of 3)]
[im 1/160]
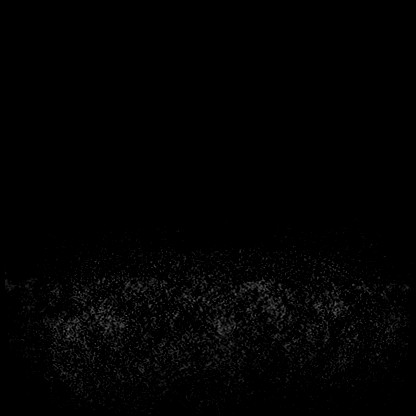
[im 40/160]
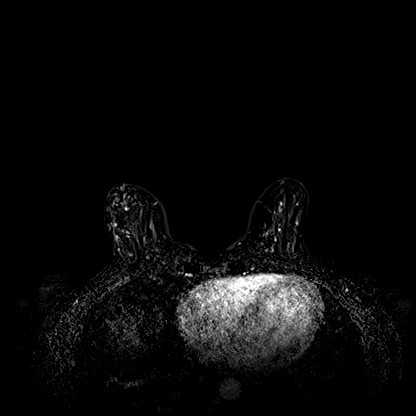
[im 80/160]
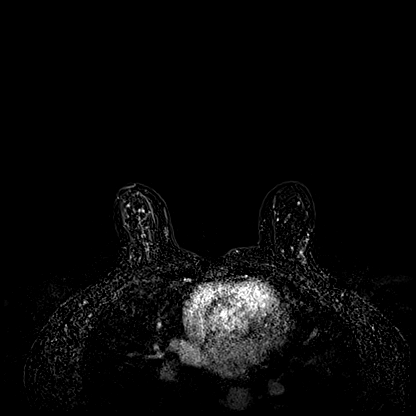
[im 120/160]
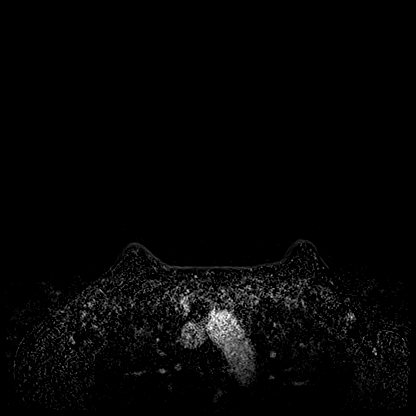
[im 160/160]
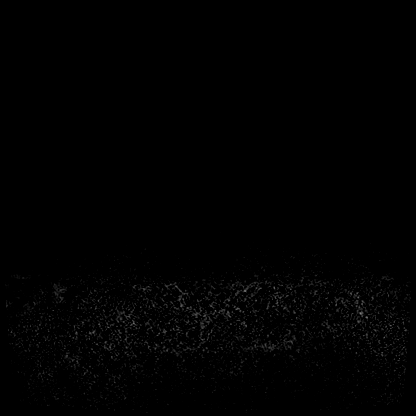

[Series 8: T1 · coronal · 340.0mm · 0.82mm/px · 1 of 3 slices shown (2 of 3)]
[im 1/3]
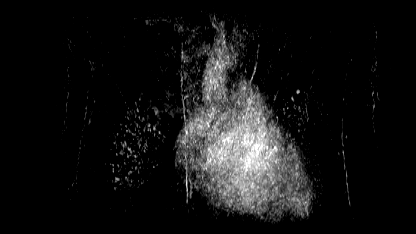

[Series 9: T1 · axial · 192.0mm · 0.82mm/px · 1 of 3 slices shown (3 of 3)]
[im 1/3]
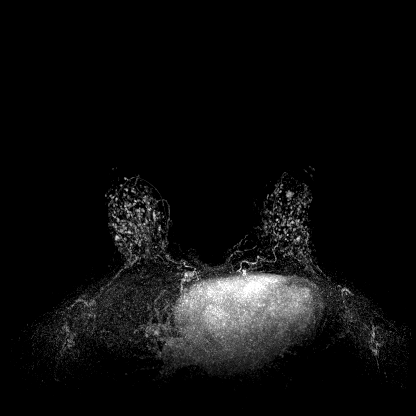

[28 of 48 positions shown; findings below may reference images not displayed]

Three-dimensional MR images were rendered by post-processing of the
original MR data on an independent workstation. The
three-dimensional MR images were interpreted, and findings are
reported in the following complete MRI report for this study. Three
dimensional images were evaluated at the independent interpreting
workstation using the DynaCAD thin client.
FINDINGS: Breast composition: d. Extreme fibroglandular tissue.

Background parenchymal enhancement: Marked

Right breast: Numerous benign cysts, largest within the outer RIGHT
breast measuring 1.9 cm. No suspicious enhancing mass, non-mass
enhancement or secondary signs of malignancy.

Left breast: Numerous benign cysts, largest measuring 8 mm. Oval
circumscribed enhancing mass within the lower LEFT breast, 6 o'clock
axis region, at anterior depth, measuring 1 x 0.9 cm (series 11,
images 114-116), with mixed persistent and progressive enhancement
kinetics.

Biopsy clip artifact within the posterior LEFT breast, presumably
corresponding to the earlier benign biopsy site revealing
fibroadenoma.

Lymph nodes: No abnormal appearing lymph nodes.

Ancillary findings:  None.
IMPRESSION: 1. Indeterminate oval circumscribed enhancing mass within the lower
LEFT breast, 6-7 o'clock axis region, measuring 1 cm. This may
represent an additional fibroadenoma. Recommend second-look targeted
ultrasound further characterization.
2. No evidence of malignancy within the RIGHT breast.
3. Numerous benign cysts throughout both breasts.

RECOMMENDATION:
1. Second-look targeted LEFT breast ultrasound for further
characterization of the indeterminate oval circumscribed enhancing
mass within the lower LEFT breast, 6-7 o'clock axis region,
measuring 1 cm, possibly an additional fibroadenoma.
2. If no sonographic correlate is identified, MRI-guided biopsy
would be recommended to ensure benignity.
3. Given patient's increased lifetime risk of breast cancer, at
greater than 20%, recommend routine annual screening mammograms and
annual breast MRIs going forward per [employer]
guidelines.

BI-RADS CATEGORY  4: Suspicious.

## 2021-04-10 MED ORDER — GADOBUTROL 1 MMOL/ML IV SOLN
7.0000 mL | Freq: Once | INTRAVENOUS | Status: AC | PRN
  Administered 2021-04-10: 7 mL via INTRAVENOUS

## 2021-04-11 ENCOUNTER — Encounter: Payer: Self-pay | Admitting: Diagnostic Neuroimaging

## 2021-04-11 ENCOUNTER — Ambulatory Visit: Payer: Medicaid Other | Admitting: Diagnostic Neuroimaging

## 2021-04-11 VITALS — BP 122/80 | HR 77 | Ht 64.0 in | Wt 149.0 lb

## 2021-04-11 DIAGNOSIS — M4802 Spinal stenosis, cervical region: Secondary | ICD-10-CM

## 2021-04-11 NOTE — Patient Instructions (Addendum)
   POST-OP CERVICAL DECOMPRESSION (ACDF C6-7) - continue PT exercises - follow up with neurosurgery  SHOULDER PAIN - continue PT exercises  LOW BACK PAIN - continue PT exercises  B12 deficiency - follow up with PCP for replacement

## 2021-04-11 NOTE — Progress Notes (Signed)
[facility]  PATIENT: Tina Russell DOB: 1980-11-22  REFERRING CLINICIAN: Georganna Skeans, MD HISTORY FROM: patient  REASON FOR VISIT: new consult    HISTORICAL  CHIEF COMPLAINT:  Chief Complaint  Patient presents with   Follow-up    Rm 6- transfer of care from Dr. Anne Hahn- would like to discuss involuntary spasms mostly at night. Felt like symptoms could be related to increase of gabapentin. She has decreased back to gabapentin 300 mg tid and severity has decreased but sx are still present. She did have surgery in august for bulging disk    HISTORY OF PRESENT ILLNESS:   40 year old with cervical spine stenosis status post decompression in August 2022.  Patient was having issues with shoulder pain, numbness and tingling.  She was found to have cervical spinal stenosis which was treated surgically.  She had good result.  She continues to have issues with numbness in her hands, neck pain, shoulder pain, low back pain.  She has been going through physical therapy exercises.  She previously ran cross-country track and have been active in taekwondo until these issues.  She is looking forward to getting back to these activities when possible.   REVIEW OF SYSTEMS: Full 14 system review of systems performed and negative with exception of: As per HPI.  ALLERGIES: Allergies  Allergen Reactions   Amoxicillin-Pot Clavulanate Rash   Strattera [Atomoxetine Hcl] Hypertension    HOME MEDICATIONS: Outpatient Medications Prior to Visit  Medication Sig Dispense Refill   cloNIDine (CATAPRES) 0.1 MG tablet Take 0.2 mg by mouth 2 (two) times daily.     gabapentin (NEURONTIN) 300 MG capsule Take 300 mg by mouth 3 (three) times daily.     lamoTRIgine (LAMICTAL) 200 MG tablet Take 200 mg by mouth at bedtime.     levonorgestrel (MIRENA) 20 MCG/24HR IUD by Intrauterine route.     tiZANidine (ZANAFLEX) 4 MG tablet Take 1 tablet (4 mg total) by mouth every 6 (six) hours as needed for  muscle spasms. 30 tablet 0   oxyCODONE-acetaminophen (PERCOCET) 5-325 MG tablet Take 1 tablet by mouth every 4 (four) hours as needed for severe pain. (Patient not taking: Reported on 04/11/2021) 30 tablet 0   No facility-administered medications prior to visit.    PAST MEDICAL HISTORY: Past Medical History:  Diagnosis Date   ADHD    ADHD    Anxiety    Bipolar disorder (HCC)    Cervical spinal stenosis 12/27/2020   C6-7 level   Chronic back pain    Common migraine with intractable migraine 12/27/2020   Depression    Family history of breast cancer    Family history of melanoma    Hypertension    Kidney failure    Migraine    Neuropathy    PTSD (post-traumatic stress disorder)     PAST SURGICAL HISTORY: Past Surgical History:  Procedure Laterality Date   ANTERIOR CERVICAL DECOMP/DISCECTOMY FUSION N/A 01/28/2021   Procedure: Cervical Six-Seven Anterior Cervical Decompression/Discectomy/Fusion;  Surgeon: Coletta Memos, MD;  Location: [facility] OR;  Service: Neurosurgery;  Laterality: N/A;   DILATION AND CURETTAGE OF UTERUS     3 blood tranfusions    FAMILY HISTORY: Family History  Problem Relation Age of Onset   Alcohol abuse Mother    Hypertension Mother    Hyperlipidemia Mother    Skin cancer Mother    Hypertension Father    Hyperlipidemia Father    Alcohol abuse Father    Alcohol abuse Brother  Hypertension Brother    Breast cancer Maternal Aunt        Neg GT in 2017   Melanoma Maternal Aunt    Hypertension Maternal Grandmother    Breast cancer Maternal Grandmother    Throat cancer Paternal Grandmother    Hypertension Paternal Grandfather    Breast cancer Other        Mother's 3 maternal cousins   Breast cancer Other    Melanoma Other        mother's maternal female first cousin    SOCIAL HISTORY: Social History   Socioeconomic History   Marital status: Single    Spouse name: Not on file   Number of children: 1   Years of education: Not on file   Highest  education level: Bachelor's degree (e.g., BA, AB, BS)  Occupational History   Not on file  Tobacco Use   Smoking status: Former    Types: E-cigarettes    Quit date: 01/20/2021    Years since quitting: 0.2   Smokeless tobacco: Never  Vaping Use   Vaping Use: Former  Substance and Sexual Activity   Alcohol use: Not Currently   Drug use: Yes    Types: Marijuana    Comment: to help with pain   Sexual activity: Not on file  Other Topics Concern   Not on file  Social History Narrative   Lives with partner, son   Social Determinants of Health   Financial Resource Strain: Not on file  Food Insecurity: Not on file  Transportation Needs: Not on file  Physical Activity: Not on file  Stress: Not on file  Social Connections: Not on file  Intimate Partner Violence: Not on file     PHYSICAL EXAM  GENERAL EXAM/CONSTITUTIONAL: Vitals:  Vitals:   04/11/21 0840  BP: 122/80  Pulse: 77  SpO2: 98%  Weight: 149 lb (67.6 kg)  Height: 5\' 4"  (1.626 m)   Body mass index is 25.58 kg/m. Wt Readings from Last 3 Encounters:  04/11/21 149 lb (67.6 kg)  03/16/21 150 lb 3 oz (68.1 kg)  01/28/21 150 lb (68 kg)   Patient is in no distress; well developed, nourished and groomed; neck is supple  CARDIOVASCULAR: Examination of carotid arteries is normal; no carotid bruits Regular rate and rhythm, no murmurs Examination of peripheral vascular system by observation and palpation is normal  EYES: Ophthalmoscopic exam of optic discs and posterior segments is normal; no papilledema or hemorrhages No results found.  MUSCULOSKELETAL: Gait, strength, tone, movements noted in Neurologic exam below  NEUROLOGIC: MENTAL STATUS:  No flowsheet data found. awake, alert, oriented to person, place and time recent and remote memory intact normal attention and concentration language fluent, comprehension intact, naming intact fund of knowledge appropriate  CRANIAL NERVE:  2nd - no papilledema on  fundoscopic exam 2nd, 3rd, 4th, 6th - pupils equal and reactive to light, visual fields full to confrontation, extraocular muscles intact, no nystagmus 5th - facial sensation symmetric 7th - facial strength symmetric 8th - hearing intact 9th - palate elevates symmetrically, uvula midline 11th - shoulder shrug symmetric 12th - tongue protrusion midline  MOTOR:  normal bulk and tone, full strength in the BUE, BLE  SENSORY:  normal and symmetric to light touch, temperature, vibration  COORDINATION:  finger-nose-finger, fine finger movements normal  REFLEXES:  deep tendon reflexes present and symmetric  GAIT/STATION:  narrow based gait     DIAGNOSTIC DATA (LABS, IMAGING, TESTING) - I reviewed patient records, labs, notes, testing  and imaging myself where available.  Lab Results  Component Value Date   WBC 7.5 01/27/2021   HGB 13.4 01/27/2021   HCT 40.1 01/27/2021   MCV 93.0 01/27/2021   PLT 217 01/27/2021      Component Value Date/Time   NA 137 01/27/2021 1305   K 4.2 01/27/2021 1305   CL 105 01/27/2021 1305   CO2 25 01/27/2021 1305   GLUCOSE 93 01/27/2021 1305   BUN 9 01/27/2021 1305   CREATININE 0.81 01/27/2021 1305   CALCIUM 8.7 (L) 01/27/2021 1305   GFRNONAA >60 01/27/2021 1305   No results found for: CHOL, HDL, LDLCALC, LDLDIRECT, TRIG, CHOLHDL No results found for: JXBJ4N No results found for: VITAMINB12 No results found for: TSH  12/03/19  B12 202   04/08/21 MRI cervical spine [I reviewed images myself and agree with interpretation. -VRP]  1. Interval ACDF at C6-7. Residual right subarticular osseous spurring with resultant mild to moderate right-sided spinal stenosis and flattening of the right hemi cord, but no cord signal changes. 2. Broad central to left paracentral disc protrusion at C5-6 with resultant mild spinal stenosis and flattening of the ventral cord. Appearance is mildly progressed from previous.   ASSESSMENT AND PLAN  40 y.o. year  old adult here with:   Dx:  1. Cervical spinal stenosis       PLAN:  POST-OP CERVICAL DECOMPRESSION (ACDF C6-7) - continue PT exercises - follow up with neurosurgery  SHOULDER PAIN - continue PT exercises  LOW BACK PAIN - continue PT exercises  B12 deficiency - follow up with PCP for replacement   Return for return to PCP, pending if symptoms worsen or fail to improve.    Suanne Marker, MD 04/11/2021, 9:40 AM Certified in Neurology, Neurophysiology and Neuroimaging  [facility] [address], [address] [place], [place] [address] [phone]

## 2021-04-12 ENCOUNTER — Other Ambulatory Visit: Payer: Self-pay | Admitting: Hematology and Oncology

## 2021-04-12 DIAGNOSIS — R9389 Abnormal findings on diagnostic imaging of other specified body structures: Secondary | ICD-10-CM

## 2021-04-13 ENCOUNTER — Ambulatory Visit: Payer: Medicaid Other | Attending: Neurosurgery

## 2021-04-13 ENCOUNTER — Other Ambulatory Visit: Payer: Self-pay

## 2021-04-13 DIAGNOSIS — M6281 Muscle weakness (generalized): Secondary | ICD-10-CM | POA: Diagnosis present

## 2021-04-13 DIAGNOSIS — M542 Cervicalgia: Secondary | ICD-10-CM

## 2021-04-13 DIAGNOSIS — M6289 Other specified disorders of muscle: Secondary | ICD-10-CM | POA: Diagnosis present

## 2021-04-13 DIAGNOSIS — M545 Low back pain, unspecified: Secondary | ICD-10-CM | POA: Diagnosis present

## 2021-04-13 NOTE — Therapy (Signed)
[facility] [address] [place], [place], [address] Phone: [phone]   Fax:  [phone]  Physical Therapy Treatment  Patient Details  Name: Tina Russell MRN: [id number] Date of Birth: July 03, 1980 Referring Provider (PT): Coletta Memos, MD   Encounter Date: 04/13/2021   PT End of Session - 04/13/21 2021     Visit Number 6    Number of Visits 20    Date for PT Re-Evaluation 04/27/21    Authorization Type [facility]    Progress Note Due on Visit 10    PT Start Time 0854    PT Stop Time 0944    PT Time Calculation (min) 50 min    Activity Tolerance Patient tolerated treatment well;Patient limited by pain;Other (comment)   limited by R shoulder pain   Behavior During Therapy [facility] for tasks assessed/performed             Past Medical History:  Diagnosis Date   ADHD    ADHD    Anxiety    Bipolar disorder (HCC)    Cervical spinal stenosis 12/27/2020   C6-7 level   Chronic back pain    Common migraine with intractable migraine 12/27/2020   Depression    Family history of breast cancer    Family history of melanoma    Hypertension    Kidney failure    Migraine    Neuropathy    PTSD (post-traumatic stress disorder)     Past Surgical History:  Procedure Laterality Date   ANTERIOR CERVICAL DECOMP/DISCECTOMY FUSION N/A 01/28/2021   Procedure: Cervical Six-Seven Anterior Cervical Decompression/Discectomy/Fusion;  Surgeon: Coletta Memos, MD;  Location: [facility] OR;  Service: Neurosurgery;  Laterality: N/A;   DILATION AND CURETTAGE OF UTERUS     3 blood tranfusions    There were no vitals filed for this visit.   Subjective Assessment - 04/13/21 0857     Subjective Pt reports she is doing OK.  Pt reports having a MRI on Sat. Generally her neck pain has been 3-4/10, but today is 2/10.    Pertinent History Significant PMH:  bipolar, PTSD, ADHD    Currently in Pain? Yes    Pain Score 2     Pain Location Neck    Pain  Orientation Left    Pain Descriptors / Indicators Aching    Pain Type Chronic pain    Pain Onset More than a month ago    Pain Frequency Constant    Pain Score 5    Pain Location Shoulder    Pain Orientation Right    Pain Descriptors / Indicators Aching    Pain Type Acute pain    Pain Onset More than a month ago    Pain Frequency Intermittent             [facility] Adult PT Treatment/Exercise:  Therapeutic Exercise: - UBE, 4 mins, 2 forward and backward - Foam roller routine - Cervical retraction 10x - Alternating ceiling punches, x10 - X to Y, x10 (R shoulder discomfort) - Bilat Shoulder ER, x10, RTB - Bilat shoulder hor. Abd, x10, RTB - D2 pattern, L 10x. R caused R shoulder pain and was Dced - Standing shoulder rows, 15x, RTB - Standing shoulder ext, 15x , RTB - Standing serratuspunch, 15x, RTB   Manual Therapy: -   Neuromuscular re-ed: -   Therapeutic Activity -  PT Education - 04/13/21 2020     Education Details updated HEP for periscapular strengthening. assessment findings for the R shoulder- impingement syndrome.    Person(s) Educated Patient    Methods Explanation;Demonstration;Tactile cues;Verbal cues;Handout    Comprehension Verbalized understanding;Returned demonstration;Verbal cues required;Tactile cues required              PT Short Term Goals - 03/29/21 0602       PT SHORT TERM GOAL #1   Title Darron Doom Banet will be >75% HEP compliant throughout therapy to improve carryover between sessions and facilitate independent management of condition    Status Achieved    Target Date 03/29/21               PT Long Term Goals - 03/29/21 0602       PT LONG TERM GOAL #1   Title YAMIL DOUGHER will achieve 28 kg grip strength in bil UE  EVAL: L 25 kg, R 23 kg  target date: 04/27/2021    Baseline EVAL: L 25 kg, R 23 kg    Status On-going    Target Date 05/27/21      PT LONG TERM GOAL #2    Title SHRUTHI NORTHRUP will improve cervical rotation to >/= 45 degrees  EVAL: L: 30, R 15 degrees  target date: 04/27/2021    Baseline EVAL: L: 30, R 15 degrees    Status On-going    Target Date 05/27/21      PT LONG TERM GOAL #3   Title CHADE PITNER will improve the following MMTs to >/= 4/5 to show improvement in strength:  shoulder ER and flexion  EVAL: unable to test  target date: 04/27/2021    Baseline unable to test    Status On-going    Target Date 05/27/21      PT LONG TERM GOAL #4   Title CADENCE MINTON will report >/= 70% decrease in pain from evaluation  EVAL: 10/10 max pain  target date: 04/27/2021. 03/28/21: 1-3/10 for L lower cervical/upper shoulder pain    Baseline 10/10 pain    Status On-going      PT LONG TERM GOAL #5   Title ALEAYAH CHICO will improve NDI score to 20 (40% disability) as a proxy for functional improvement  EVAL: 40 (80% disability)  target date: 04/27/2021    Baseline 20 (40% disability)    Status On-going    Target Date 05/27/21                   Plan - 04/13/21 2024     Clinical Impression Statement With ther for periscapular strengthening, the pt'sR shoulder experienced pain near or at endrange for r shoulder flexion or abd. Hankins-Kennendy was positive. Pt's R shoulder appears symptomatic of impingement syndrome. Referral for treatment requested of Dr. Franky Macho, referring MD. PT was provided for periscapular/posterior chain strengthening which was modified due to R shoulder pain. Regarding L cervical and upper shoulder pain, pt is subjectively reporting improved pain for this primary area of care.    Stability/Clinical Decision Making Stable/Uncomplicated    Clinical Decision Making Low    Rehab Potential Good    PT Frequency 2x / week    PT Duration 8 weeks    PT Treatment/Interventions ADLs/Self Care Home Management;Aquatic Therapy;Iontophoresis 4mg /ml Dexamethasone;Moist Heat;Gait training;Therapeutic activities;Therapeutic  exercise;Neuromuscular re-education;Balance training;Dry needling    PT Next Visit Plan Gentle AROM, periscapular progression, cervical progression as appropriate, assess progress with LTGs.  PT Home Exercise Plan [email or web address]    Recommended Other Services Contacted Dr. Franky Macho referring MD re: referral for PT for R shoulder treatment    Consulted and Agree with Plan of Care Patient             Patient will benefit from skilled therapeutic intervention in order to improve the following deficits and impairments:  Pain, Impaired UE functional use, Decreased strength, Decreased range of motion  Visit Diagnosis: Cervicalgia  Muscle weakness  Low back pain, unspecified back pain laterality, unspecified chronicity, unspecified whether sciatica present     Problem List Patient Active Problem List   Diagnosis Date Noted   Intermittent left-sided chest pain 04/05/2021   Cervical spondylosis with radiculopathy 01/27/2021   Genetic testing 01/10/2021   Family history of breast cancer 12/28/2020   Family history of melanoma 12/28/2020   Cervical spinal stenosis 12/27/2020   Common migraine with intractable migraine 12/27/2020   Joellyn Rued MS, PT 04/13/21 8:52 PM   [facility] [address] [place], [place], [address] Phone: [phone]   Fax:  [phone]  Name: JOANIE DUPREY MRN: [id number] Date of Birth: 1981/01/12

## 2021-04-15 ENCOUNTER — Other Ambulatory Visit: Payer: Medicaid Other

## 2021-04-17 ENCOUNTER — Ambulatory Visit (INDEPENDENT_AMBULATORY_CARE_PROVIDER_SITE_OTHER): Payer: Medicaid Other | Admitting: Family Medicine

## 2021-04-17 ENCOUNTER — Other Ambulatory Visit: Payer: Self-pay

## 2021-04-17 ENCOUNTER — Encounter: Payer: Self-pay | Admitting: Family Medicine

## 2021-04-17 ENCOUNTER — Ambulatory Visit: Payer: Medicaid Other | Admitting: Diagnostic Neuroimaging

## 2021-04-17 VITALS — BP 136/85 | HR 65 | Temp 98.2°F | Resp 16 | Wt 143.2 lb

## 2021-04-17 DIAGNOSIS — F603 Borderline personality disorder: Secondary | ICD-10-CM

## 2021-04-17 NOTE — Progress Notes (Signed)
Patient said the her psychiatrist is not the one for her and she needs a referral. Patient need a referral to a new psychiatrist and neurologist

## 2021-04-18 ENCOUNTER — Encounter: Payer: Self-pay | Admitting: Family Medicine

## 2021-04-18 NOTE — Progress Notes (Signed)
Established Patient Office Visit  Subjective:  Patient ID: Tina Russell, adult    DOB: [birth year]  Age: 40 y.o. MRN: [id number]  CC:  Chief Complaint  Patient presents with   Follow-up    HPI Tina Russell presents for desiring a referral regarding her mood disorders. She reports that her meds don't seem to adequately address her symptoms and that she is having difficulty trying to manage them herself.   Past Medical History:  Diagnosis Date   ADHD    ADHD    Anxiety    Bipolar disorder (HCC)    Cervical spinal stenosis 12/27/2020   C6-7 level   Chronic back pain    Common migraine with intractable migraine 12/27/2020   Depression    Family history of breast cancer    Family history of melanoma    Hypertension    Kidney failure    Migraine    Neuropathy    PTSD (post-traumatic stress disorder)     Past Surgical History:  Procedure Laterality Date   ANTERIOR CERVICAL DECOMP/DISCECTOMY FUSION N/A 01/28/2021   Procedure: Cervical Six-Seven Anterior Cervical Decompression/Discectomy/Fusion;  Surgeon: Coletta Memos, MD;  Location: [facility] OR;  Service: Neurosurgery;  Laterality: N/A;   DILATION AND CURETTAGE OF UTERUS     3 blood tranfusions    Family History  Problem Relation Age of Onset   Alcohol abuse Mother    Hypertension Mother    Hyperlipidemia Mother    Skin cancer Mother    Hypertension Father    Hyperlipidemia Father    Alcohol abuse Father    Alcohol abuse Brother    Hypertension Brother    Breast cancer Maternal Aunt        Neg GT in 2017   Melanoma Maternal Aunt    Hypertension Maternal Grandmother    Breast cancer Maternal Grandmother    Throat cancer Paternal Grandmother    Hypertension Paternal Grandfather    Breast cancer Other        Mother's 3 maternal cousins   Breast cancer Other    Melanoma Other        mother's maternal female first cousin    Social History   Socioeconomic History   Marital status: Single    Spouse name: Not on file    Number of children: 1   Years of education: Not on file   Highest education level: Bachelor's degree (e.g., BA, AB, BS)  Occupational History   Not on file  Tobacco Use   Smoking status: Former    Types: E-cigarettes    Quit date: 01/20/2021    Years since quitting: 0.2   Smokeless tobacco: Never  Vaping Use   Vaping Use: Former  Substance and Sexual Activity   Alcohol use: Not Currently   Drug use: Yes    Types: Marijuana    Comment: to help with pain   Sexual activity: Not on file  Other Topics Concern   Not on file  Social History Narrative   Lives with partner, son   Social Determinants of Health   Financial Resource Strain: Not on file  Food Insecurity: Not on file  Transportation Needs: Not on file  Physical Activity: Not on file  Stress: Not on file  Social Connections: Not on file  Intimate Partner Violence: Not on file    ROS Review of Systems  Psychiatric/Behavioral:  Positive for decreased concentration. Negative for self-injury, sleep disturbance and suicidal ideas. The patient is nervous/anxious.   All  other systems reviewed and are negative.  Objective:   Today's Vitals: BP 136/85 (BP Location: Left Arm, Patient Position: Sitting, Cuff Size: Normal)   Pulse 65   Temp 98.2 F (36.8 C) (Oral)   Resp 16   Wt 143 lb 3.2 oz (65 kg)   BMI 24.58 kg/m   Physical Exam Vitals and nursing note reviewed.  Constitutional:      General: Tina Russell is not in acute distress. Cardiovascular:     Rate and Rhythm: Normal rate and regular rhythm.  Pulmonary:     Effort: Pulmonary effort is normal.     Breath sounds: Normal breath sounds.  Neurological:     General: No focal deficit present.     Mental Status: Tina Russell is alert and oriented to person, place, and time.  Psychiatric:        Mood and Affect: Affect normal. Mood is anxious.        Behavior: Behavior normal.    Assessment & Plan:   1. Borderline personality disorder [facility]) Referral to  psych for further eval/mgt especially pertaining to med management. - Ambulatory referral to Psychiatry    Outpatient Encounter Medications as of 04/17/2021  Medication Sig   cloNIDine (CATAPRES) 0.1 MG tablet Take 0.2 mg by mouth 2 (two) times daily.   gabapentin (NEURONTIN) 300 MG capsule Take 300 mg by mouth 3 (three) times daily.   lamoTRIgine (LAMICTAL) 200 MG tablet Take 200 mg by mouth at bedtime.   levonorgestrel (MIRENA) 20 MCG/24HR IUD by Intrauterine route.   tiZANidine (ZANAFLEX) 4 MG tablet Take 1 tablet (4 mg total) by mouth every 6 (six) hours as needed for muscle spasms.   No facility-administered encounter medications on file as of 04/17/2021.    Follow-up: No follow-ups on file.   [name], MD

## 2021-04-19 ENCOUNTER — Ambulatory Visit: Payer: Medicaid Other

## 2021-04-19 ENCOUNTER — Other Ambulatory Visit: Payer: Self-pay

## 2021-04-19 DIAGNOSIS — M545 Low back pain, unspecified: Secondary | ICD-10-CM

## 2021-04-19 DIAGNOSIS — M6281 Muscle weakness (generalized): Secondary | ICD-10-CM

## 2021-04-19 DIAGNOSIS — M6289 Other specified disorders of muscle: Secondary | ICD-10-CM

## 2021-04-19 DIAGNOSIS — M542 Cervicalgia: Secondary | ICD-10-CM | POA: Diagnosis not present

## 2021-04-19 NOTE — Therapy (Signed)
[facility] [address] [place], [place], [address] Phone: [phone]   Fax:  [phone]  Physical Therapy Treatment  Patient Details  Name: Tina Russell MRN: [id number] Date of Birth: [birth year] Referring Provider (PT): Coletta Memos, MD   Encounter Date: 04/19/2021   PT End of Session - 04/19/21 1307     Visit Number 7    Number of Visits 20    Date for PT Re-Evaluation 04/27/21    Authorization Type [place] [facility]    Progress Note Due on Visit 10    PT Start Time 1152    PT Stop Time 1236    PT Time Calculation (min) 44 min    Activity Tolerance Patient tolerated treatment well    Behavior During Therapy [facility] for tasks assessed/performed             Past Medical History:  Diagnosis Date   ADHD    ADHD    Anxiety    Bipolar disorder (HCC)    Cervical spinal stenosis 12/27/2020   C6-7 level   Chronic back pain    Common migraine with intractable migraine 12/27/2020   Depression    Family history of breast cancer    Family history of melanoma    Hypertension    Kidney failure    Migraine    Neuropathy    PTSD (post-traumatic stress disorder)     Past Surgical History:  Procedure Laterality Date   ANTERIOR CERVICAL DECOMP/DISCECTOMY FUSION N/A 01/28/2021   Procedure: Cervical Six-Seven Anterior Cervical Decompression/Discectomy/Fusion;  Surgeon: Coletta Memos, MD;  Location: [facility] OR;  Service: Neurosurgery;  Laterality: N/A;   DILATION AND CURETTAGE OF UTERUS     3 blood tranfusions    There were no vitals filed for this visit.   Subjective Assessment - 04/19/21 1200     Subjective Pt reports she is doing OK today. General aches and pains, good and bad days. Has felt stronger, the neck feels better, as well as the R shoulder.    Diagnostic tests 10/29.22: IMPRESSION:  1. Interval ACDF at C6-7. Residual right subarticular osseous  spurring with resultant mild to moderate right-sided spinal stenosis  and  flattening of the right hemi cord, but no cord signal changes.  2. Broad central to left paracentral disc protrusion at C5-6 with  resultant mild spinal stenosis and flattening of the ventral cord.  Appearance is mildly progressed from previous.    Currently in Pain? Yes    Pain Location Neck    Pain Orientation Left    Pain Descriptors / Indicators Aching    Pain Onset More than a month ago    Pain Frequency Constant    Pain Score 3   endrange for abd   Pain Location Shoulder    Pain Orientation Right    Pain Descriptors / Indicators Aching    Pain Type Acute pain    Pain Onset More than a month ago    Pain Frequency Intermittent              [facility] Adult PT Treatment/Exercise:   Therapeutic Exercise: - UBE, 4 mins, 2 forward and backward - Cervical retraction 10x - Upper trap stretch, 2x 20", L and R - Levator stretch, 2x 20". Land R - Bilat Shoulder ER, 2 x 10, RTB - Bilat shoulder hor. abd, 2 x 10, RTB - Standing shoulder rows, 15x, RTB - Standing shoulder ext, 15x , RTB - Standing serratuspunch, 15x, RTB  Manual Therapy: NA   Neuromuscular re-ed: NA   Therapeutic Activity NA   - D2 pattern, L 10x. R caused R shoulder pain and was Dced - Alternating ceiling punches, x10 - X to Y, x10                              PT Short Term Goals - 03/29/21 0602       PT SHORT TERM GOAL #1   Title [name] will be >75% HEP compliant throughout therapy to improve carryover between sessions and facilitate independent management of condition    Status Achieved    Target Date 03/29/21               PT Long Term Goals - 03/29/21 0602       PT LONG TERM GOAL #1   Title DARIELYS GIGLIA will achieve 28 kg grip strength in bil UE  EVAL: L 25 kg, R 23 kg  target date: 04/27/2021    Baseline EVAL: L 25 kg, R 23 kg    Status On-going    Target Date 05/27/21      PT LONG TERM GOAL #2   Title ALYSE KATHAN will improve cervical rotation  to >/= 45 degrees  EVAL: L: 30, R 15 degrees  target date: 04/27/2021    Baseline EVAL: L: 30, R 15 degrees    Status On-going    Target Date 05/27/21      PT LONG TERM GOAL #3   Title FELIZ HERARD will improve the following MMTs to >/= 4/5 to show improvement in strength:  shoulder ER and flexion  EVAL: unable to test  target date: 04/27/2021    Baseline unable to test    Status On-going    Target Date 05/27/21      PT LONG TERM GOAL #4   Title JASMARIE COPPOCK will report >/= 70% decrease in pain from evaluation  EVAL: 10/10 max pain  target date: 04/27/2021. 03/28/21: 1-3/10 for L lower cervical/upper shoulder pain    Baseline 10/10 pain    Status On-going      PT LONG TERM GOAL #5   Title AMORE ACKMAN will improve NDI score to 20 (40% disability) as a proxy for functional improvement  EVAL: 40 (80% disability)  target date: 04/27/2021    Baseline 20 (40% disability)    Status On-going    Target Date 05/27/21                   Plan - 04/19/21 1309     Clinical Impression Statement PT was completed for cervical mobility and UB strengthening primarily periscapular and posterior chair. Pt tolerated gradual progression in the ther ex demand. Pt's reports better tolerance to ther ex re: feeling stronger with less pain. Pt continues to report L lateral chest/ scapular pain which was provoked c ressited IR c tenderness of the teres major. Possible manual PT the next session. Pt tolerated PT today without adverse effects. Results of a new MRI are in subjective section.    Stability/Clinical Decision Making Stable/Uncomplicated    Clinical Decision Making Low    Rehab Potential Good    PT Frequency 2x / week    PT Duration 8 weeks    PT Treatment/Interventions ADLs/Self Care Home Management;Aquatic Therapy;Iontophoresis 4mg /ml Dexamethasone;Moist Heat;Gait training;Therapeutic activities;Therapeutic exercise;Neuromuscular re-education;Balance training;Dry needling     PT Next Visit Plan Gentle  AROM, periscapular progression, cervical progression as appropriate, assess progress with LTGs. manual PT of L scapular pain- teres major    PT Home Exercise Plan [email or web address]    Consulted and Agree with Plan of Care Patient             Patient will benefit from skilled therapeutic intervention in order to improve the following deficits and impairments:  Pain, Impaired UE functional use, Decreased strength, Decreased range of motion  Visit Diagnosis: Cervicalgia  Muscle weakness  Muscle tightness  Low back pain, unspecified back pain laterality, unspecified chronicity, unspecified whether sciatica present     Problem List Patient Active Problem List   Diagnosis Date Noted   Intermittent left-sided chest pain 04/05/2021   Cervical spondylosis with radiculopathy 01/27/2021   Genetic testing 01/10/2021   Family history of breast cancer 12/28/2020   Family history of melanoma 12/28/2020   Cervical spinal stenosis 12/27/2020   Common migraine with intractable migraine 12/27/2020   Joellyn Rued MS, PT 04/19/21 1:28 PM  [facility] [address] [place], [place], [address] Phone: [phone]   Fax:  [phone]  Name: Tina Russell MRN: [id number] Date of Birth: 1980/11/12

## 2021-04-21 ENCOUNTER — Other Ambulatory Visit: Payer: Self-pay | Admitting: Neurosurgery

## 2021-04-21 ENCOUNTER — Ambulatory Visit: Payer: Medicaid Other

## 2021-04-21 DIAGNOSIS — M546 Pain in thoracic spine: Secondary | ICD-10-CM

## 2021-04-26 ENCOUNTER — Ambulatory Visit: Payer: Medicaid Other

## 2021-04-26 ENCOUNTER — Other Ambulatory Visit: Payer: Self-pay

## 2021-04-26 DIAGNOSIS — M6289 Other specified disorders of muscle: Secondary | ICD-10-CM

## 2021-04-26 DIAGNOSIS — M6281 Muscle weakness (generalized): Secondary | ICD-10-CM

## 2021-04-26 DIAGNOSIS — M542 Cervicalgia: Secondary | ICD-10-CM

## 2021-04-26 DIAGNOSIS — M545 Low back pain, unspecified: Secondary | ICD-10-CM

## 2021-04-27 NOTE — Therapy (Signed)
[facility] Outpatient Rehabilitation [facility] [address] [place], [place], [address] Phone: [phone]   Fax:  [phone]  Physical Therapy Treatment  Patient Details  Name: Tina Russell MRN: [id number] Date of Birth: May 24, 1981 Referring Provider (PT): Coletta Memos, MD   Encounter Date: 04/26/2021   PT End of Session - 04/27/21 2345     Visit Number 7    Number of Visits 20    Date for PT Re-Evaluation 04/27/21    Authorization Type [place] MCD    Progress Note Due on Visit 10    PT Start Time 0850    PT Stop Time 0930    PT Time Calculation (min) 40 min    Activity Tolerance Patient tolerated treatment well    Behavior During Therapy [facility] for tasks assessed/performed             Past Medical History:  Diagnosis Date   ADHD    ADHD    Anxiety    Bipolar disorder (HCC)    Cervical spinal stenosis 12/27/2020   C6-7 level   Chronic back pain    Common migraine with intractable migraine 12/27/2020   Depression    Family history of breast cancer    Family history of melanoma    Hypertension    Kidney failure    Migraine    Neuropathy    PTSD (post-traumatic stress disorder)     Past Surgical History:  Procedure Laterality Date   ANTERIOR CERVICAL DECOMP/DISCECTOMY FUSION N/A 01/28/2021   Procedure: Cervical Six-Seven Anterior Cervical Decompression/Discectomy/Fusion;  Surgeon: Coletta Memos, MD;  Location: [facility] OR;  Service: Neurosurgery;  Laterality: N/A;   DILATION AND CURETTAGE OF UTERUS     3 blood tranfusions    There were no vitals filed for this visit.   Subjective Assessment - 04/27/21 2344     Subjective Pt reports seeing the neurosurgeon and he stated she has L shoulder issues. Pt notes L cervical pain is better and she is primarily experiencing tightness. Pt continues to report L lateral chest/scapula pain.    Pertinent History Significant PMH:  bipolar, PTSD, ADHD    Diagnostic tests 10/29.22: IMPRESSION:  1. Interval ACDF at  C6-7. Residual right subarticular osseous  spurring with resultant mild to moderate right-sided spinal stenosis  and flattening of the right hemi cord, but no cord signal changes.  2. Broad central to left paracentral disc protrusion at C5-6 with  resultant mild spinal stenosis and flattening of the ventral cord.  Appearance is mildly progressed from previous.    Currently in Pain? Yes    Pain Score 1     Pain Location Neck    Pain Orientation Left    Pain Descriptors / Indicators Aching    Pain Type Chronic pain    Pain Onset More than a month ago    Pain Frequency Intermittent    Pain Score 5    Pain Location Shoulder    Pain Orientation Right    Pain Descriptors / Indicators Aching    Pain Type Chronic pain    Pain Onset More than a month ago    Pain Frequency Intermittent                               [facility] Adult PT Treatment/Exercise - 04/27/21 0001       Exercises   Exercises Neck;Shoulder      Shoulder Exercises: Standing   Protraction  Both;15 reps    Theraband Level (Shoulder Protraction) Level 3 (Green)    External Rotation Both;15 reps    Theraband Level (Shoulder External Rotation) Level 3 (Green)    Extension Both;10 reps   2 sets   Theraband Level (Shoulder Extension) Level 3 (Green)    Row Both;10 reps   2 sets   Theraband Level (Shoulder Row) Level 3 (Green)      Manual Therapy   Manual Therapy Soft tissue mobilization    Soft tissue mobilization STM for the Lt upper trap, levator scapulae, and interscapular/thoracic paraspinals, infraspinatus, and teres minor and major. Pt's L lateral chest/scapula pain was reproduce with STM of the infraspinatus and the teres minor with TPs palpated. Following STM TPDN was provided to the infraspinatus and teres minor.            Trigger Point Dry Needling Treatment: Pre-treatment instruction: Patient instructed on dry needling rationale, procedures, and possible side effects including pain during  treatment (achy,cramping feeling), bruising, drop of blood, lightheadedness, nausea, sweating. Patient Consent Given: Yes Education handout provided: Yes Muscles treated: L infraspinatus and teres minor  Needle size and number:  .30 x 30 mm, 2 Electrical stimulation performed: No Parameters: N/A Treatment response/outcome: Twitch response elicited and Palpable decrease in muscle tension Post-treatment instructions: Patient instructed to expect possible mild to moderate muscle soreness later today and/or tomorrow. Patient instructed in methods to reduce muscle soreness and to continue prescribed HEP.   Patient was also educated on signs and symptoms of infection and to seek medical attention should they occur. Patient verbalized understanding of these instructions and education.           PT Short Term Goals - 03/29/21 0602       PT SHORT TERM GOAL #1   Title Tina Russell will be >75% HEP compliant throughout therapy to improve carryover between sessions and facilitate independent management of condition    Status Achieved    Target Date 03/29/21               PT Long Term Goals - 03/29/21 0602       PT LONG TERM GOAL #1   Title Tina Russell will achieve 28 kg grip strength in bil UE  EVAL: L 25 kg, R 23 kg  target date: 04/27/2021    Baseline EVAL: L 25 kg, R 23 kg    Status On-going    Target Date 05/27/21      PT LONG TERM GOAL #2   Title Tina Russell will improve cervical rotation to >/= 45 degrees  EVAL: L: 30, R 15 degrees  target date: 04/27/2021    Baseline EVAL: L: 30, R 15 degrees    Status On-going    Target Date 05/27/21      PT LONG TERM GOAL #3   Title Tina Russell will improve the following MMTs to >/= 4/5 to show improvement in strength:  shoulder ER and flexion  EVAL: unable to test  target date: 04/27/2021    Baseline unable to test    Status On-going    Target Date 05/27/21      PT LONG TERM GOAL #4   Title Tina Russell will report  >/= 70% decrease in pain from evaluation  EVAL: 10/10 max pain  target date: 04/27/2021. 03/28/21: 1-3/10 for L lower cervical/upper shoulder pain    Baseline 10/10 pain    Status On-going      PT LONG  TERM GOAL #5   Title Tina Russell will improve NDI score to 20 (40% disability) as a proxy for functional improvement  EVAL: 40 (80% disability)  target date: 04/27/2021    Baseline 20 (40% disability)    Status On-going    Target Date 05/27/21                   Plan - 04/27/21 2329     Clinical Impression Statement PT was completed for upper body strengthening with empasis on posterior chain strengthening. Additionally, STM revealed areas provoking the pt's L lateral chest/scapula pain with TP's prresent. Dry needling was then completed to the infraspinatus and teres minor muscles. Pt tolerated with anticpated level of muscle soreness following TPDN. Pt tolerated progressive demands for upper body strengthening. Pt will continue to benefit from skilled PT to address pain and strength deficits to improved cervical and upper body function.    Stability/Clinical Decision Making Stable/Uncomplicated    Clinical Decision Making Low    Rehab Potential Good    PT Frequency 2x / week    PT Duration 8 weeks    PT Treatment/Interventions ADLs/Self Care Home Management;Aquatic Therapy;Iontophoresis 4mg /ml Dexamethasone;Moist Heat;Gait training;Therapeutic activities;Therapeutic exercise;Neuromuscular re-education;Balance training;Dry needling    PT Next Visit Plan Gentle AROM, periscapular progression, cervical progression as appropriate, assess progress with LTGs. manual PT or TPDN of L scapular pain- teres minor    PT Home Exercise Plan [email or web address]    Consulted and Agree with Plan of Care Patient             Patient will benefit from skilled therapeutic intervention in order to improve the following deficits and impairments:  Pain, Impaired UE functional use, Decreased strength,  Decreased range of motion  Visit Diagnosis: Cervicalgia  Muscle weakness  Muscle tightness  Low back pain, unspecified back pain laterality, unspecified chronicity, unspecified whether sciatica present     Problem List Patient Active Problem List   Diagnosis Date Noted   Intermittent left-sided chest pain 04/05/2021   Cervical spondylosis with radiculopathy 01/27/2021   Genetic testing 01/10/2021   Family history of breast cancer 12/28/2020   Family history of melanoma 12/28/2020   Cervical spinal stenosis 12/27/2020   Common migraine with intractable migraine 12/27/2020   12/29/2020 MS, PT 04/27/21 11:57 PM   [facility] [address] [place], [place], [place] Phone: [phone]   Fax:  [phone]  Name: Tina Russell MRN: [name] Date of Birth: 24-Nov-1980

## 2021-04-28 ENCOUNTER — Other Ambulatory Visit: Payer: Self-pay

## 2021-04-28 ENCOUNTER — Ambulatory Visit: Payer: Medicaid Other

## 2021-04-28 DIAGNOSIS — M542 Cervicalgia: Secondary | ICD-10-CM | POA: Diagnosis not present

## 2021-04-28 DIAGNOSIS — M6289 Other specified disorders of muscle: Secondary | ICD-10-CM

## 2021-04-28 DIAGNOSIS — M545 Low back pain, unspecified: Secondary | ICD-10-CM

## 2021-04-28 DIAGNOSIS — M6281 Muscle weakness (generalized): Secondary | ICD-10-CM

## 2021-04-28 NOTE — Therapy (Signed)
[facility] Outpatient Rehabilitation [facility] [address] [place], [place], [address] Phone: [phone]   Fax:  [phone]  Physical Therapy Treatment  Patient Details  Name: Tina Russell MRN: [id number] Date of Birth: 02-03-1981 Referring Provider (PT): Coletta Memos, MD   Encounter Date: 04/28/2021   PT End of Session - 04/28/21 1412     Visit Number 9    Number of Visits 20    Date for PT Re-Evaluation 04/27/21    Authorization Type [place] MCD    Progress Note Due on Visit 10    PT Start Time 1025   8 mins late   PT Stop Time 1100    PT Time Calculation (min) 35 min    Activity Tolerance Patient tolerated treatment well    Behavior During Therapy [facility] for tasks assessed/performed             Past Medical History:  Diagnosis Date   ADHD    ADHD    Anxiety    Bipolar disorder (HCC)    Cervical spinal stenosis 12/27/2020   C6-7 level   Chronic back pain    Common migraine with intractable migraine 12/27/2020   Depression    Family history of breast cancer    Family history of melanoma    Hypertension    Kidney failure    Migraine    Neuropathy    PTSD (post-traumatic stress disorder)     Past Surgical History:  Procedure Laterality Date   ANTERIOR CERVICAL DECOMP/DISCECTOMY FUSION N/A 01/28/2021   Procedure: Cervical Six-Seven Anterior Cervical Decompression/Discectomy/Fusion;  Surgeon: Coletta Memos, MD;  Location: [facility] OR;  Service: Neurosurgery;  Laterality: N/A;   DILATION AND CURETTAGE OF UTERUS     3 blood tranfusions    There were no vitals filed for this visit.   Subjective Assessment - 04/28/21 1035     Subjective Pt reports the dry needled area has continued to be sore. Also, her neck has been bothering her more at a 3-5/10 level which she thinks is related to sitting with poorer posture on the couch last night and also decreasing the use of gabapentin and the tizanidine.    Diagnostic tests 10/29.22: IMPRESSION:  1. Interval  ACDF at C6-7. Residual right subarticular osseous  spurring with resultant mild to moderate right-sided spinal stenosis  and flattening of the right hemi cord, but no cord signal changes.  2. Broad central to left paracentral disc protrusion at C5-6 with  resultant mild spinal stenosis and flattening of the ventral cord.  Appearance is mildly progressed from previous.    Currently in Pain? Yes    Pain Score 4     Pain Location Neck    Pain Orientation Left    Pain Descriptors / Indicators Aching    Pain Type Chronic pain    Pain Onset More than a month ago    Pain Frequency Intermittent                               [facility] Adult PT Treatment/Exercise - 04/28/21 0001       Exercises   Exercises Neck;Shoulder      Neck Exercises: Machines for Strengthening   UBE (Upper Arm Bike) 4 min, L1, 2 mins forward and 2 mins backwards      Neck Exercises: Standing   Neck Retraction 10 reps;3 secs    Neck Retraction Limitations into ball against wall  Neck Exercises: Seated   Cervical Isometrics Right lateral flexion;Left lateral flexion;5 secs;10 reps      Neck Exercises: Stretches   Other Neck Stretches --      Shoulder Exercises: Standing   Protraction Both;10 reps   2 sets   Theraband Level (Shoulder Protraction) Level 3 (Green)    External Rotation Both;10 reps   2 sets   Theraband Level (Shoulder External Rotation) Level 2 (Red)    Extension Both;10 reps   2 sets   Theraband Level (Shoulder Extension) Level 3 (Green)    Row Both;10 reps   2 sets   Theraband Level (Shoulder Row) Level 3 Chilton Si)                     PT Education - 04/28/21 1439     Education Details Discussed continued soreness of L teres minor and recommended continued exs as tolerated    Person(s) Educated Patient    Methods Explanation    Comprehension Verbalized understanding              PT Short Term Goals - 03/29/21 0602       PT SHORT TERM GOAL #1   Title  Tina Russell will be >75% HEP compliant throughout therapy to improve carryover between sessions and facilitate independent management of condition    Status Achieved    Target Date 03/29/21               PT Long Term Goals - 03/29/21 0602       PT LONG TERM GOAL #1   Title Tina Russell will achieve 28 kg grip strength in bil UE  EVAL: L 25 kg, R 23 kg  target date: 04/27/2021    Baseline EVAL: L 25 kg, R 23 kg    Status On-going    Target Date 05/27/21      PT LONG TERM GOAL #2   Title Tina Russell will improve cervical rotation to >/= 45 degrees  EVAL: L: 30, R 15 degrees  target date: 04/27/2021    Baseline EVAL: L: 30, R 15 degrees    Status On-going    Target Date 05/27/21      PT LONG TERM GOAL #3   Title Tina Russell will improve the following MMTs to >/= 4/5 to show improvement in strength:  shoulder ER and flexion  EVAL: unable to test  target date: 04/27/2021    Baseline unable to test    Status On-going    Target Date 05/27/21      PT LONG TERM GOAL #4   Title Tina Russell will report >/= 70% decrease in pain from evaluation  EVAL: 10/10 max pain  target date: 04/27/2021. 03/28/21: 1-3/10 for L lower cervical/upper shoulder pain    Baseline 10/10 pain    Status On-going      PT LONG TERM GOAL #5   Title Tina Russell will improve NDI score to 20 (40% disability) as a proxy for functional improvement  EVAL: 40 (80% disability)  target date: 04/27/2021    Baseline 20 (40% disability)    Status On-going    Target Date 05/27/21                   Plan - 04/28/21 1414     Clinical Impression Statement Soreness of the L teres minor has continued following the TPDN completed 2 days ago. PT was completed for cervical strengthening and  for posterior chain upper body strengthening. Pt tolerated exs involving the teres minor without additional soreness. Pt continues to report overall improvement with her neck although she has been experiencing  a minimal increase in pain with her decreasing her use of tizanidine and gabapentin.    Stability/Clinical Decision Making Stable/Uncomplicated    Clinical Decision Making Low    Rehab Potential Good    PT Frequency 2x / week    PT Duration 8 weeks    PT Treatment/Interventions ADLs/Self Care Home Management;Aquatic Therapy;Iontophoresis 4mg /ml Dexamethasone;Moist Heat;Gait training;Therapeutic activities;Therapeutic exercise;Neuromuscular re-education;Balance training;Dry needling    PT Next Visit Plan Gentle AROM, periscapular progression, cervical progression as appropriate, assess progress with LTGs. Continue TPDN as tolerated.    PT Home Exercise Plan [id number]    Consulted and Agree with Plan of Care Patient             Patient will benefit from skilled therapeutic intervention in order to improve the following deficits and impairments:  Pain, Impaired UE functional use, Decreased strength, Decreased range of motion  Visit Diagnosis: Cervicalgia  Muscle weakness  Muscle tightness  Low back pain, unspecified back pain laterality, unspecified chronicity, unspecified whether sciatica present     Problem List Patient Active Problem List   Diagnosis Date Noted   Intermittent left-sided chest pain 04/05/2021   Cervical spondylosis with radiculopathy 01/27/2021   Genetic testing 01/10/2021   Family history of breast cancer 12/28/2020   Family history of melanoma 12/28/2020   Cervical spinal stenosis 12/27/2020   Common migraine with intractable migraine 12/27/2020    12/29/2020 MS, PT 04/28/21 2:50 PM   [facility] [address] [place], [place], [place] Phone: [phone]   Fax:  [phone]  Name: Tina Russell MRN: [name] Date of Birth: 1981-02-26

## 2021-05-01 ENCOUNTER — Ambulatory Visit: Payer: Medicaid Other

## 2021-05-01 ENCOUNTER — Other Ambulatory Visit: Payer: Self-pay

## 2021-05-01 DIAGNOSIS — M545 Low back pain, unspecified: Secondary | ICD-10-CM

## 2021-05-01 DIAGNOSIS — M6289 Other specified disorders of muscle: Secondary | ICD-10-CM

## 2021-05-01 DIAGNOSIS — M6281 Muscle weakness (generalized): Secondary | ICD-10-CM

## 2021-05-01 DIAGNOSIS — M542 Cervicalgia: Secondary | ICD-10-CM

## 2021-05-01 NOTE — Therapy (Signed)
[facility] Outpatient Rehabilitation [facility] [address] [place], [place], [address] Phone: [phone]   Fax:  [phone]  Physical Therapy Treatment/Progress Note  Progress Note Reporting Period 02/16/21 to 05/01/21  See note below for Objective Data and Assessment of Progress/Goals.      Patient Details  Name: Tina Russell MRN: [id number] Date of Birth: 04-18-1981 Referring Provider (PT): Coletta Memos, MD   Encounter Date: 05/01/2021   PT End of Session - 05/01/21 0925     Visit Number 10    Number of Visits 20    Date for PT Re-Evaluation 06/10/21    Authorization Type [place] MCD    Authorization Time Period Approved 12 PT visits from 04/06/2021-05/17/2021    Authorization - Visit Number 9    Authorization - Number of Visits 12    Progress Note Due on Visit 20    PT Start Time 0927   arrived late   PT Stop Time 1000    PT Time Calculation (min) 33 min    Activity Tolerance Patient tolerated treatment well    Behavior During Therapy [facility] for tasks assessed/performed             Past Medical History:  Diagnosis Date   ADHD    ADHD    Anxiety    Bipolar disorder (HCC)    Cervical spinal stenosis 12/27/2020   C6-7 level   Chronic back pain    Common migraine with intractable migraine 12/27/2020   Depression    Family history of breast cancer    Family history of melanoma    Hypertension    Kidney failure    Migraine    Neuropathy    PTSD (post-traumatic stress disorder)     Past Surgical History:  Procedure Laterality Date   ANTERIOR CERVICAL DECOMP/DISCECTOMY FUSION N/A 01/28/2021   Procedure: Cervical Six-Seven Anterior Cervical Decompression/Discectomy/Fusion;  Surgeon: Coletta Memos, MD;  Location: [facility] OR;  Service: Neurosurgery;  Laterality: N/A;   DILATION AND CURETTAGE OF UTERUS     3 blood tranfusions    There were no vitals filed for this visit.   Subjective Assessment - 05/01/21 0928     Subjective Pt presents to PT with  reports of continued neck stiffness and discomfort. She also continues to note lower back discomfort, with continued increases in neck and lower back pain to 10/10 at worst on occasional. She is ready to begin PT treatment at this time.    Currently in Pain? Yes    Pain Score 2    continued 10/10 pain at worst   Pain Location Neck    Pain Orientation Posterior    Pain Descriptors / Indicators Tightness    Pain Score 4    Pain Location Back    Pain Orientation Lower    Pain Descriptors / Indicators Aching           [facility] Adult PT Treatment/Exercise:   Therapeutic Exercise:  UBE L1 x 4 min (72fwd/2bwd) while taking subjective Standing chin tuck w/ ball x 10 - 5" Row 3x10 13lbs Standing ext x 10 10lbs Supine horizontal abd 2x10 red tband  Manual Therapy: N/A   Neuromuscular re-ed: N/A   Therapeutic Activity: Re-assessment of tests/measures, goals, and outcomes for purposes of progress note   Modalities: N/A   Self Care: N/A   Consider / progression for next session:    [facility] PT Assessment - 05/01/21 0001       Observation/Other Assessments   Other Surveys  Neck Disability Index    Neck Disability Index  42% disability      Other:   Other/ Comments Grip strength (kg): L: 33, R: 32      AROM   Cervical - Right Rotation 40    Cervical - Left Rotation 42                                      PT Short Term Goals - 03/29/21 0602       PT SHORT TERM GOAL #1   Title Tina Russell will be >75% HEP compliant throughout therapy to improve carryover between sessions and facilitate independent management of condition    Status Achieved    Target Date 03/29/21               PT Long Term Goals - 05/01/21 0934       PT LONG TERM GOAL #1   Title Tina Russell will achieve 28 kg grip strength in bil UE  EVAL: L 25 kg, R 23 kg  target date: 04/27/2021    Baseline EVAL: L 25 kg, R 23 kg   -  L 33  R 32 on 11/21    Time --    Period  Weeks    Status Achieved    Target Date --      PT LONG TERM GOAL #2   Title Tina Russell will improve cervical rotation to >/= 45 degrees  EVAL: L: 30, R 15 degrees  target date: 04/27/2021    Baseline EVAL: L: 30, R 15 degrees;    R 40   L42 on 05/01/21    Status On-going    Target Date 06/10/21      PT LONG TERM GOAL #3   Title Tina Russell will improve the following MMTs to >/= 4/5 to show improvement in strength:  shoulder ER and flexion  EVAL: unable to test  target date: 04/27/2021    Baseline unable to test    Time 6    Period Weeks    Status On-going    Target Date 06/10/21      PT LONG TERM GOAL #4   Title Tina Russell will report >/= 70% decrease in pain from evaluation  EVAL: 10/10 max pain  target date: 04/27/2021. 03/28/21: 1-3/10 for L lower cervical/upper shoulder pain    Baseline 10/10 pain; continued 10/10 pain at worst    Time 6    Period Weeks    Status On-going    Target Date 06/10/21      PT LONG TERM GOAL #5   Title Tina Russell will improve NDI score to 20 (40% disability) as a proxy for functional improvement  EVAL: 40 (80% disability)  target date: 04/27/2021    Baseline 20 (40% disability); - 42% disability on 05/01/21    Time 6    Period Weeks    Status On-going    Target Date 06/10/21                   Plan - 05/01/21 1057     Clinical Impression Statement Pt tolerated treatment fair and was able to complete prescribed exercises with only slight increases in neck pain. Over the course of PT treatment, she shows subjective improvement with decreased NDI score, as well as improved grip strength and cervical ROM. Unfortunately, she continues to  have significant neck pain and discomfort that limits her functional activities and community participation. She continues to benefit from skilled PT services and will continue to be seen and progressed as able.    Stability/Clinical Decision Making Stable/Uncomplicated    Rehab  Potential Good    PT Frequency 2x / week    PT Duration 8 weeks    PT Treatment/Interventions ADLs/Self Care Home Management;Aquatic Therapy;Iontophoresis 4mg /ml Dexamethasone;Moist Heat;Gait training;Therapeutic activities;Therapeutic exercise;Neuromuscular re-education;Balance training;Dry needling    PT Next Visit Plan Gentle AROM, periscapular progression, cervical progression as appropriate, assess progress with LTGs. Continue TPDN as tolerated.    PT Home Exercise Plan [id number]    Consulted and Agree with Plan of Care Patient             Patient will benefit from skilled therapeutic intervention in order to improve the following deficits and impairments:  Pain, Impaired UE functional use, Decreased strength, Decreased range of motion  Visit Diagnosis: Cervicalgia - Plan: PT plan of care cert/re-cert  Muscle weakness - Plan: PT plan of care cert/re-cert  Muscle tightness - Plan: PT plan of care cert/re-cert  Low back pain, unspecified back pain laterality, unspecified chronicity, unspecified whether sciatica present - Plan: PT plan of care cert/re-cert     Problem List Patient Active Problem List   Diagnosis Date Noted   Intermittent left-sided chest pain 04/05/2021   Cervical spondylosis with radiculopathy 01/27/2021   Genetic testing 01/10/2021   Family history of breast cancer 12/28/2020   Family history of melanoma 12/28/2020   Cervical spinal stenosis 12/27/2020   Common migraine with intractable migraine 12/27/2020    12/29/2020, PT 05/01/2021, 11:02 AM  [facility] [address] [place], [place], [place] Phone: [phone]   Fax:  [phone]  Name: Tina Russell MRN: Tina Russell Date of Birth: Nov 24, 1980

## 2021-05-02 ENCOUNTER — Inpatient Hospital Stay: Admission: RE | Admit: 2021-05-02 | Payer: Medicaid Other | Source: Ambulatory Visit

## 2021-05-03 ENCOUNTER — Other Ambulatory Visit: Payer: Self-pay

## 2021-05-03 ENCOUNTER — Ambulatory Visit: Payer: Medicaid Other

## 2021-05-03 DIAGNOSIS — M6289 Other specified disorders of muscle: Secondary | ICD-10-CM

## 2021-05-03 DIAGNOSIS — M542 Cervicalgia: Secondary | ICD-10-CM

## 2021-05-03 DIAGNOSIS — M545 Low back pain, unspecified: Secondary | ICD-10-CM

## 2021-05-03 DIAGNOSIS — M6281 Muscle weakness (generalized): Secondary | ICD-10-CM

## 2021-05-03 NOTE — Therapy (Signed)
[facility] Outpatient Rehabilitation [facility] [address] [place], [place], [address] Phone: [phone]   Fax:  [phone]  Physical Therapy Treatment  Patient Details  Name: Tina Russell MRN: [id number] Date of Birth: June 17, 1980 Referring Provider (PT): Coletta Memos, MD   Encounter Date: 05/03/2021   PT End of Session - 05/03/21 1605     Visit Number 11    Number of Visits 20    Date for PT Re-Evaluation 06/10/21    Authorization Type [place] MCD    Authorization Time Period Approved 12 PT visits from 04/06/2021-05/17/2021    Authorization - Visit Number 10    Authorization - Number of Visits 12    Progress Note Due on Visit 20    PT Start Time 1556    PT Stop Time 1653    PT Time Calculation (min) 57 min    Activity Tolerance Patient tolerated treatment well    Behavior During Therapy [facility] for tasks assessed/performed             Past Medical History:  Diagnosis Date   ADHD    ADHD    Anxiety    Bipolar disorder (HCC)    Cervical spinal stenosis 12/27/2020   C6-7 level   Chronic back pain    Common migraine with intractable migraine 12/27/2020   Depression    Family history of breast cancer    Family history of melanoma    Hypertension    Kidney failure    Migraine    Neuropathy    PTSD (post-traumatic stress disorder)     Past Surgical History:  Procedure Laterality Date   ANTERIOR CERVICAL DECOMP/DISCECTOMY FUSION N/A 01/28/2021   Procedure: Cervical Six-Seven Anterior Cervical Decompression/Discectomy/Fusion;  Surgeon: Coletta Memos, MD;  Location: [facility] OR;  Service: Neurosurgery;  Laterality: N/A;   DILATION AND CURETTAGE OF UTERUS     3 blood tranfusions    There were no vitals filed for this visit.   Subjective Assessment - 05/03/21 1600     Subjective Reports her neck is hurting her more than usual for an unknown  reason other than this week being a stressful week.    Diagnostic tests 10/29.22: IMPRESSION:  1. Interval ACDF at  C6-7. Residual right subarticular osseous  spurring with resultant mild to moderate right-sided spinal stenosis  and flattening of the right hemi cord, but no cord signal changes.  2. Broad central to left paracentral disc protrusion at C5-6 with  resultant mild spinal stenosis and flattening of the ventral cord.  Appearance is mildly progressed from previous.    Currently in Pain? Yes    Pain Score 6     Pain Location Neck    Pain Orientation Posterior    Pain Type Chronic pain    Pain Onset More than a month ago    Pain Frequency Intermittent                               [facility] Adult PT Treatment/Exercise - 05/03/21 0001       Exercises   Exercises Neck;Shoulder      Neck Exercises: Machines for Strengthening   UBE (Upper Arm Bike) 4 min, L1, 2 mins forward and 2 mins backwards      Neck Exercises: Standing   Neck Retraction 10 reps;3 secs   increase in L cevical pain today   Neck Retraction Limitations into ball against wall  Neck Exercises: Seated   Cervical Isometrics Right lateral flexion;Left lateral flexion;5 secs;10 reps    Neck Retraction 10 reps;3 secs      Neck Exercises: Stretches   Upper Trapezius Stretch Right;Left;2 reps;20 seconds    Levator Stretch Right;Left;2 reps;20 seconds      Shoulder Exercises: Standing   Protraction Both;15 reps   2 sets   Theraband Level (Shoulder Protraction) Level 3 (Green)    External Rotation Both;15 reps   1 set   Theraband Level (Shoulder External Rotation) Level 3 (Green)    Flexion Right;Left;10 reps    Shoulder Flexion Weight (lbs) 3    Flexion Limitations to 90d    ABduction Right;Left;10 reps   scaption   Shoulder ABduction Weight (lbs) 3    ABduction Limitations to 90d    Extension Both;15 reps   2 sets   Theraband Level (Shoulder Extension) Level 3 (Green)    Row Both;10 reps   2 sets   Theraband Level (Shoulder Row) Level 3 (Green)    Other Standing Exercises Paloff press 15x, each side       Moist Heat Therapy   Number Minutes Moist Heat 15 Minutes    Moist Heat Location Cervical;Shoulder   upper back                      PT Short Term Goals - 03/29/21 0602       PT SHORT TERM GOAL #1   Title Tina Russell will be >75% HEP compliant throughout therapy to improve carryover between sessions and facilitate independent management of condition    Status Achieved    Target Date 03/29/21               PT Long Term Goals - 05/01/21 0934       PT LONG TERM GOAL #1   Title Tina Russell will achieve 28 kg grip strength in bil UE  EVAL: L 25 kg, R 23 kg  target date: 04/27/2021    Baseline EVAL: L 25 kg, R 23 kg   -  L 33  R 32 on 11/21    Time --    Period Weeks    Status Achieved    Target Date --      PT LONG TERM GOAL #2   Title Tina Russell will improve cervical rotation to >/= 45 degrees  EVAL: L: 30, R 15 degrees  target date: 04/27/2021    Baseline EVAL: L: 30, R 15 degrees;    R 40   L42 on 05/01/21    Status On-going    Target Date 06/10/21      PT LONG TERM GOAL #3   Title Tina Russell will improve the following MMTs to >/= 4/5 to show improvement in strength:  shoulder ER and flexion  EVAL: unable to test  target date: 04/27/2021    Baseline unable to test    Time 6    Period Weeks    Status On-going    Target Date 06/10/21      PT LONG TERM GOAL #4   Title Tina Russell will report >/= 70% decrease in pain from evaluation  EVAL: 10/10 max pain  target date: 04/27/2021. 03/28/21: 1-3/10 for L lower cervical/upper shoulder pain    Baseline 10/10 pain; continued 10/10 pain at worst    Time 6    Period Weeks    Status On-going    Target Date  06/10/21      PT LONG TERM GOAL #5   Title Tina Russell will improve NDI score to 20 (40% disability) as a proxy for functional improvement  EVAL: 40 (80% disability)  target date: 04/27/2021    Baseline 20 (40% disability); - 42% disability on 05/01/21    Time 6    Period  Weeks    Status On-going    Target Date 06/10/21                   Plan - 05/03/21 1713     Clinical Impression Statement Pt was completed for cervical and shoulder girdle posterior chain strengthening depite pt's report of increased pain today, sh participated in a PT session that was minimall progressive in demand. Pt did report increased L cervical pain c resisted cervical retraction after the ex. Following ther ex, moist heat was provided for pain management. Following the moist heat, the pt did report her pain level did decrease to the pre-session baseline level.    Stability/Clinical Decision Making Stable/Uncomplicated    Clinical Decision Making Low    Rehab Potential Good    PT Frequency 2x / week    PT Duration 8 weeks    PT Treatment/Interventions ADLs/Self Care Home Management;Aquatic Therapy;Iontophoresis 4mg /ml Dexamethasone;Moist Heat;Gait training;Therapeutic activities;Therapeutic exercise;Neuromuscular re-education;Balance training;Dry needling    PT Next Visit Plan Gentle AROM, periscapular progression, cervical progression as appropriate, assess progress with LTGs. Continue TPDN as tolerated.    PT Home Exercise Plan [email or web address]    Consulted and Agree with Plan of Care Patient             Patient will benefit from skilled therapeutic intervention in order to improve the following deficits and impairments:  Pain, Impaired UE functional use, Decreased strength, Decreased range of motion  Visit Diagnosis: Cervicalgia  Muscle weakness  Muscle tightness  Low back pain, unspecified back pain laterality, unspecified chronicity, unspecified whether sciatica present     Problem List Patient Active Problem List   Diagnosis Date Noted   Intermittent left-sided chest pain 04/05/2021   Cervical spondylosis with radiculopathy 01/27/2021   Genetic testing 01/10/2021   Family history of breast cancer 12/28/2020   Family history of melanoma 12/28/2020    Cervical spinal stenosis 12/27/2020   Common migraine with intractable migraine 12/27/2020   12/29/2020 MS, PT 05/03/21 5:25 PM  [facility] [address] [place], [place], [place] Phone: [phone]   Fax:  [phone]  Name: Tina Russell MRN: Tina Russell Date of Birth: [birth year]

## 2021-05-10 ENCOUNTER — Other Ambulatory Visit: Payer: Self-pay

## 2021-05-10 ENCOUNTER — Ambulatory Visit: Payer: Medicaid Other

## 2021-05-10 DIAGNOSIS — M545 Low back pain, unspecified: Secondary | ICD-10-CM

## 2021-05-10 DIAGNOSIS — M6289 Other specified disorders of muscle: Secondary | ICD-10-CM

## 2021-05-10 DIAGNOSIS — M6281 Muscle weakness (generalized): Secondary | ICD-10-CM

## 2021-05-10 DIAGNOSIS — M542 Cervicalgia: Secondary | ICD-10-CM | POA: Diagnosis not present

## 2021-05-11 NOTE — Therapy (Signed)
[facility] Outpatient Rehabilitation [facility] [address] [place], [place], [address] Phone: [phone]   Fax:  [phone]  Physical Therapy Treatment  Patient Details  Name: Tina Russell MRN: [id number] Date of Birth: 06/10/1981 Referring Provider (PT): Coletta Memos, MD   Encounter Date: 05/10/2021   PT End of Session - 05/11/21 1747     Visit Number 12    Number of Visits 20    Date for PT Re-Evaluation 06/10/21    Authorization Type [place] [employer]    Authorization Time Period Approved 12 PT visits from 04/06/2021-05/17/2021    Authorization - Visit Number 10    Authorization - Number of Visits 12    Progress Note Due on Visit 20    PT Start Time 0853    PT Stop Time 0936    PT Time Calculation (min) 43 min    Activity Tolerance Patient tolerated treatment well    Behavior During Therapy [facility] for tasks assessed/performed             Past Medical History:  Diagnosis Date   ADHD    ADHD    Anxiety    Bipolar disorder (HCC)    Cervical spinal stenosis 12/27/2020   C6-7 level   Chronic back pain    Common migraine with intractable migraine 12/27/2020   Depression    Family history of breast cancer    Family history of melanoma    Hypertension    Kidney failure    Migraine    Neuropathy    PTSD (post-traumatic stress disorder)     Past Surgical History:  Procedure Laterality Date   ANTERIOR CERVICAL DECOMP/DISCECTOMY FUSION N/A 01/28/2021   Procedure: Cervical Six-Seven Anterior Cervical Decompression/Discectomy/Fusion;  Surgeon: Coletta Memos, MD;  Location: [facility] OR;  Service: Neurosurgery;  Laterality: N/A;   DILATION AND CURETTAGE OF UTERUS     3 blood tranfusions    There were no vitals filed for this visit.   Subjective Assessment - 05/11/21 1746     Subjective Pt reports seeing an orthopedist yesterday and received a shot for her R shoulder and it is feeling much better. She notes her L neck and upper shoulder are more moble today as  well. Pt stated Dr. Prentiss Bells may refer her to another PT practice for her R shoulder.    Pertinent History Significant PMH:  bipolar, PTSD, ADHD    Diagnostic tests 10/29.22: IMPRESSION:  1. Interval ACDF at C6-7. Residual right subarticular osseous  spurring with resultant mild to moderate right-sided spinal stenosis  and flattening of the right hemi cord, but no cord signal changes.  2. Broad central to left paracentral disc protrusion at C5-6 with  resultant mild spinal stenosis and flattening of the ventral cord.  Appearance is mildly progressed from previous.    Currently in Pain? Yes    Pain Score 4     Pain Location Neck    Pain Orientation Posterior    Pain Descriptors / Indicators Tightness    Pain Type Chronic pain    Pain Onset More than a month ago    Pain Frequency Intermittent    Pain Score 0    Pain Location Shoulder    Pain Orientation Right                               [facility] Adult PT Treatment/Exercise - 05/11/21 0001       Exercises  Exercises Neck;Shoulder      Neck Exercises: Machines for Strengthening   UBE (Upper Arm Bike) 4 min, L1, 2 mins forward and 2 mins backwards      Neck Exercises: Seated   Neck Retraction 3 secs;5 reps    Cervical Rotation Right;Left;5 reps   3 sec   Cervical Rotation Limitations neck retraction prior    Lateral Flexion Right;Left;5 reps   3 sec   Lateral Flexion Limitations neck retraction      Shoulder Exercises: Standing   Protraction Both;20 reps   2 sets   Theraband Level (Shoulder Protraction) Level 3 (Green)    External Rotation Both;20 reps   2 sets   Theraband Level (Shoulder External Rotation) Level 3 (Green)    Extension Both;20 reps   2 sets   Theraband Level (Shoulder Extension) Level 3 (Green)    Row Both;20 reps   2 sets   Theraband Level (Shoulder Row) Level 3 (Green)    Other Standing Exercises Shoulder flexion, reaching up to shoulder height and overhead height shelves 10x c a 3# weight                        PT Short Term Goals - 03/29/21 0602       PT SHORT TERM GOAL #1   Title Tina Russell will be >75% HEP compliant throughout therapy to improve carryover between sessions and facilitate independent management of condition    Status Achieved    Target Date 03/29/21               PT Long Term Goals - 05/01/21 0934       PT LONG TERM GOAL #1   Title Tina Russell will achieve 28 kg grip strength in bil UE  EVAL: L 25 kg, R 23 kg  target date: 04/27/2021    Baseline EVAL: L 25 kg, R 23 kg   -  L 33  R 32 on 11/21    Time --    Period Weeks    Status Achieved    Target Date --      PT LONG TERM GOAL #2   Title Tina Russell will improve cervical rotation to >/= 45 degrees  EVAL: L: 30, R 15 degrees  target date: 04/27/2021    Baseline EVAL: L: 30, R 15 degrees;    R 40   L42 on 05/01/21    Status On-going    Target Date 06/10/21      PT LONG TERM GOAL #3   Title Tina Russell will improve the following MMTs to >/= 4/5 to show improvement in strength:  shoulder ER and flexion  EVAL: unable to test  target date: 04/27/2021    Baseline unable to test    Time 6    Period Weeks    Status On-going    Target Date 06/10/21      PT LONG TERM GOAL #4   Title Tina Russell will report >/= 70% decrease in pain from evaluation  EVAL: 10/10 max pain  target date: 04/27/2021. 03/28/21: 1-3/10 for L lower cervical/upper shoulder pain    Baseline 10/10 pain; continued 10/10 pain at worst    Time 6    Period Weeks    Status On-going    Target Date 06/10/21      PT LONG TERM GOAL #5   Title Tina Russell will improve NDI score to 20 (40% disability)  as a proxy for functional improvement  EVAL: 40 (80% disability)  target date: 04/27/2021    Baseline 20 (40% disability); - 42% disability on 05/01/21    Time 6    Period Weeks    Status On-going    Target Date 06/10/21                   Plan - 05/11/21 1412     Clinical  Impression Statement Advised pt whatever her decesion was re: PT for her R shoulder that receiving her PT from 1 facility would be best for her care. Pt's R shoulder pain is much better following receiving a cortisone injection yesterday. PT was completed today for cervical ROM and posterior chain strengthening bilat maintaining shoulder motions below 90d. Additionally, shoulder level and overhead reaching ROM/strengthening ex were completed for the L UE. Pt tolerated today's session without adverse effects. Will complete a re-assessment the next PT session.    Stability/Clinical Decision Making Stable/Uncomplicated    Clinical Decision Making Low    Rehab Potential Good    PT Frequency 2x / week    PT Duration 8 weeks    PT Treatment/Interventions ADLs/Self Care Home Management;Aquatic Therapy;Iontophoresis 4mg /ml Dexamethasone;Moist Heat;Gait training;Therapeutic activities;Therapeutic exercise;Neuromuscular re-education;Balance training;Dry needling    PT Next Visit Plan Gentle AROM, periscapular progression, cervical progression as appropriate, assess progress with LTGs. Continue TPDN as tolerated. Complete a re-assessment the netx PT session.    PT Home Exercise Plan [id number]    Consulted and Agree with Plan of Care Patient             Patient will benefit from skilled therapeutic intervention in order to improve the following deficits and impairments:  Pain, Impaired UE functional use, Decreased strength, Decreased range of motion  Visit Diagnosis: Cervicalgia  Muscle weakness  Muscle tightness  Low back pain, unspecified back pain laterality, unspecified chronicity, unspecified whether sciatica present     Problem List Patient Active Problem List   Diagnosis Date Noted   Intermittent left-sided chest pain 04/05/2021   Cervical spondylosis with radiculopathy 01/27/2021   Genetic testing 01/10/2021   Family history of breast cancer 12/28/2020   Family history of melanoma  12/28/2020   Cervical spinal stenosis 12/27/2020   Common migraine with intractable migraine 12/27/2020    12/29/2020 MS, PT 05/11/21 5:47 PM   [facility] [address] [place], [place], [place] Phone: [phone]   Fax:  [phone]  Name: JOELI Russell MRN: [name] Date of Birth: 1981/03/12

## 2021-05-12 ENCOUNTER — Ambulatory Visit: Payer: Medicaid Other

## 2021-05-18 ENCOUNTER — Ambulatory Visit: Payer: Medicaid Other

## 2021-05-19 ENCOUNTER — Ambulatory Visit
Admission: RE | Admit: 2021-05-19 | Discharge: 2021-05-19 | Disposition: A | Discharge status: Home / Self Care | Payer: Medicaid Other | Source: Ambulatory Visit | Attending: Neurosurgery | Admitting: Neurosurgery

## 2021-05-19 DIAGNOSIS — M546 Pain in thoracic spine: Secondary | ICD-10-CM

## 2021-05-19 IMAGING — MR MR THORACIC SPINE W/O CM
4 of 5 series · 21 of 48 positions shown · non-contrast
Comparison: Prior MRI of the cervical spine from [DATE], as
well as prior radiograph from [DATE].

CLINICAL DATA: Initial evaluation for left scapular pain with
radiation to the left side of spine in to left arm for 2 years,
occasional bilateral foot numbness.

EXAM:
MRI THORACIC SPINE WITHOUT CONTRAST
TECHNIQUE: Multiplanar, multisequence MR imaging of the thoracic spine was
performed. No intravenous contrast was administered.

[Series 5: STIR · sagittal · 3.0mm · 1.09mm/px · 3 of 21 slices shown]
[im 5/21]
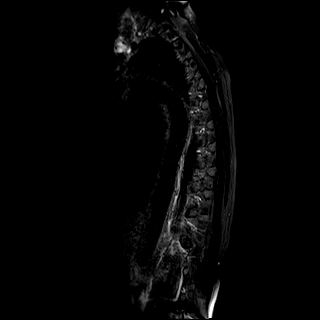
[im 13/21]
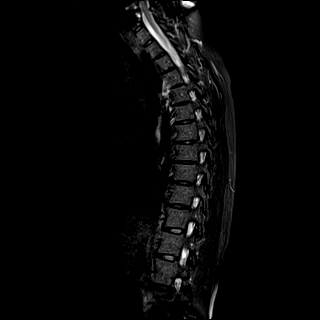
[im 21/21]
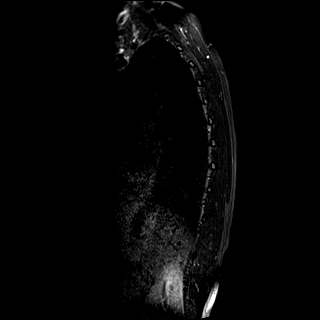

[Series 6: T2 · sagittal · 3.0mm · 0.68mm/px · 6 of 21 slices shown (1 of 2)]
[im 1/21]
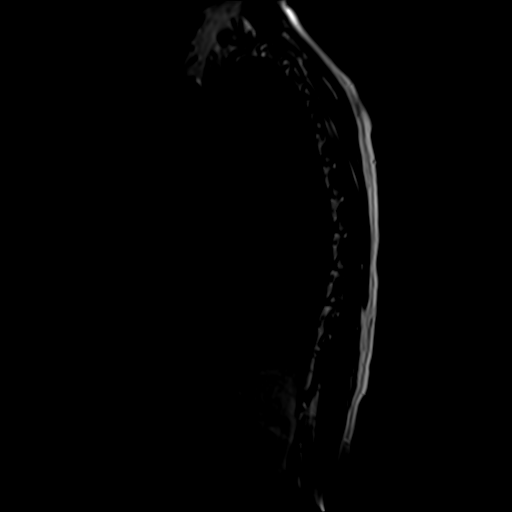
[im 5/21]
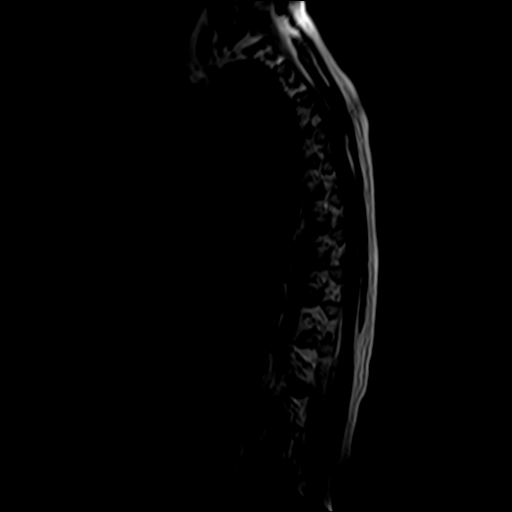
[im 9/21]
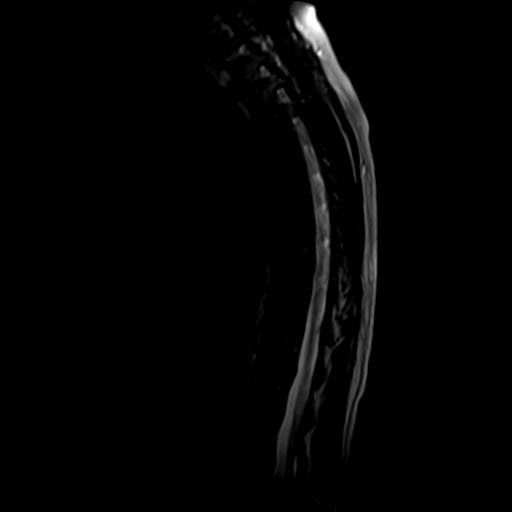
[im 13/21]
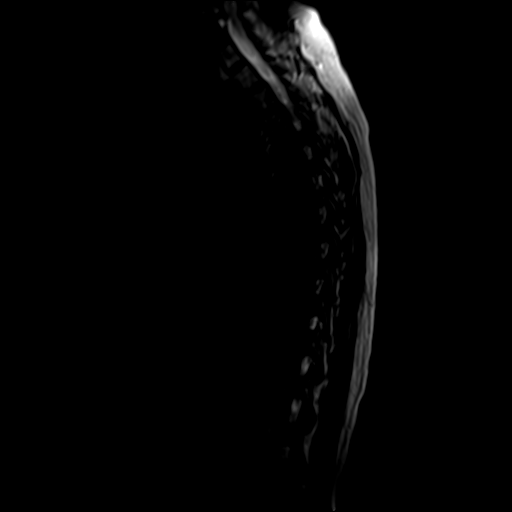
[im 17/21]
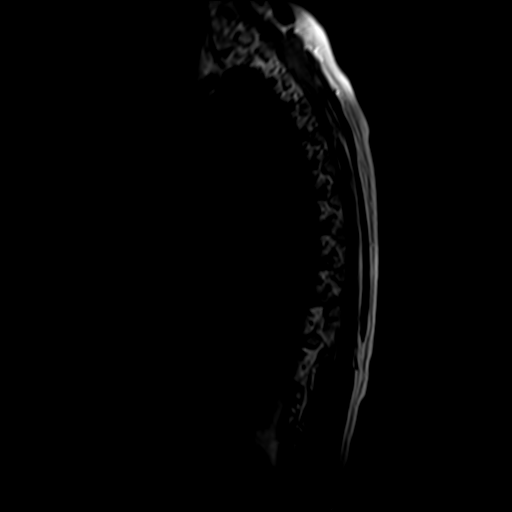
[im 21/21]
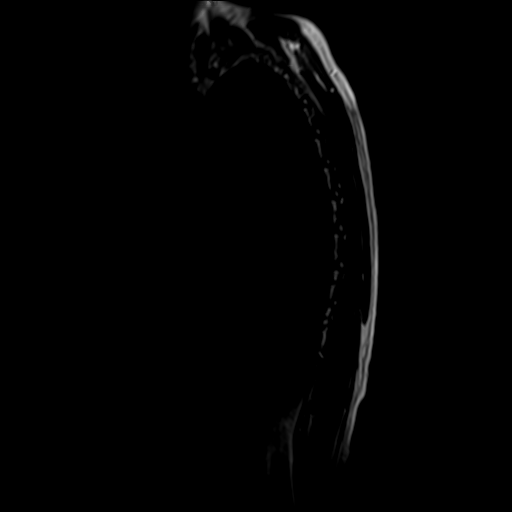

[Series 7: T1 · sagittal · 3.0mm · 0.68mm/px · 3 of 21 slices shown]
[im 5/21]
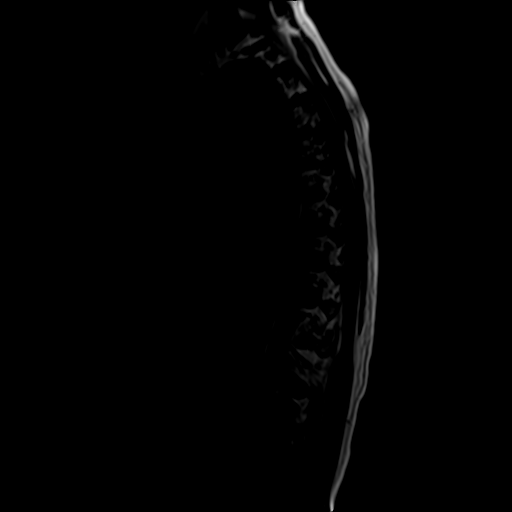
[im 13/21]
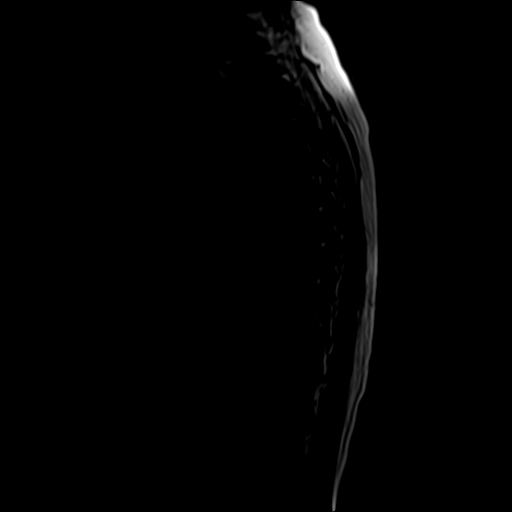
[im 21/21]
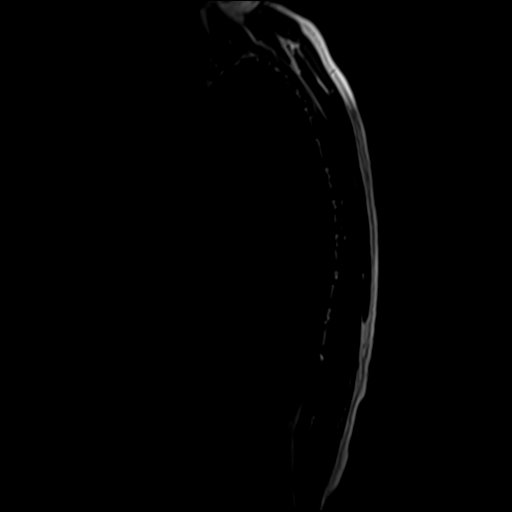

[Series 8: T2 · axial · 4.0mm · 0.41mm/px · z∈[-193,+97]mm · 9 of 52 slices shown (2 of 2)]
[im 1/52]
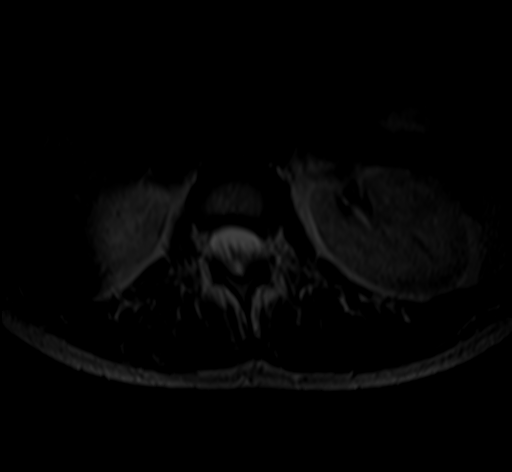
[im 8/52]
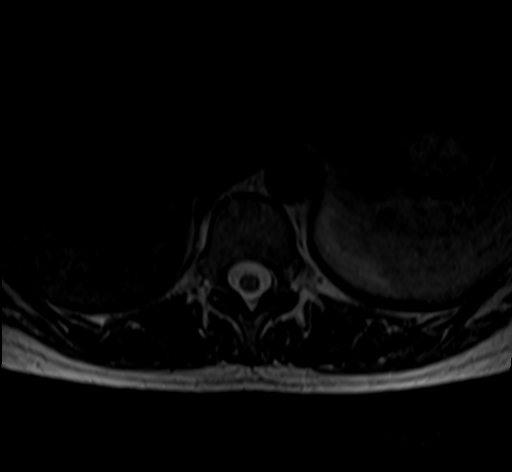
[im 15/52]
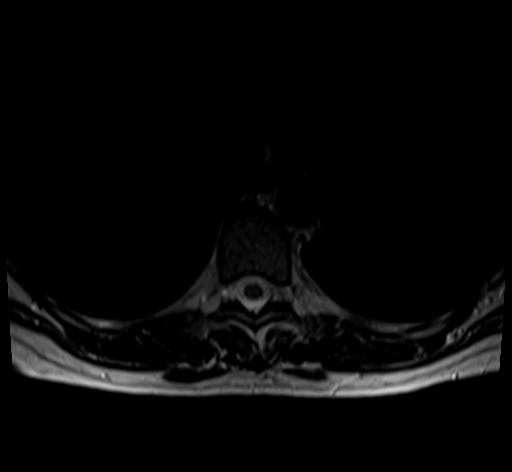
[im 22/52]
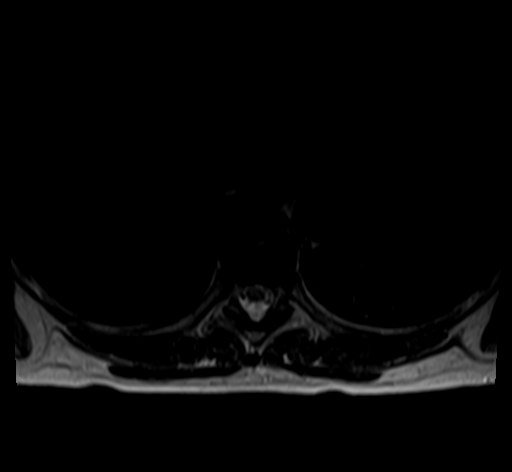
[im 26/52]
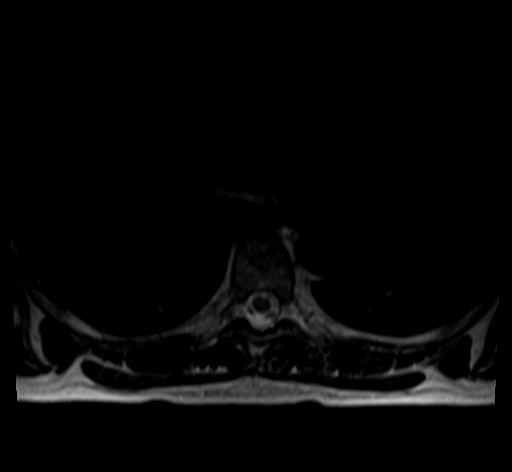
[im 30/52]
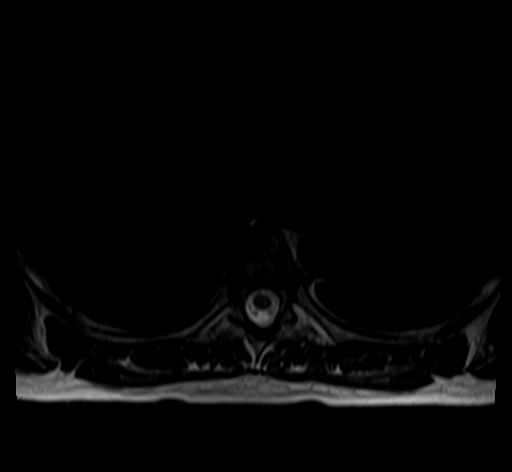
[im 37/52]
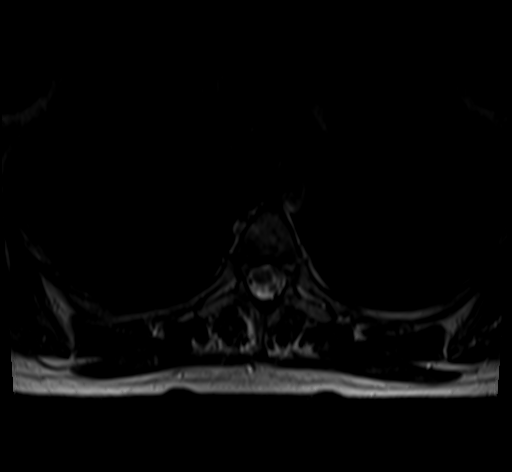
[im 44/52]
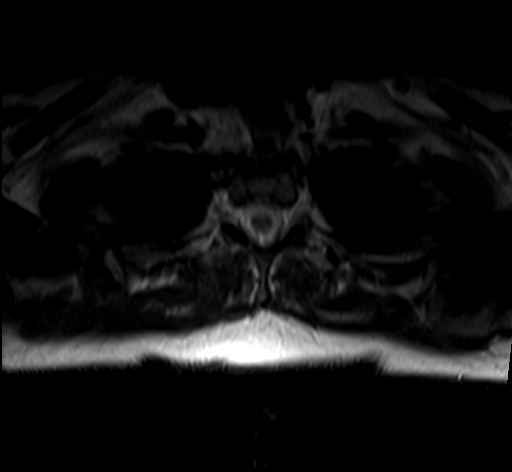
[im 52/52]
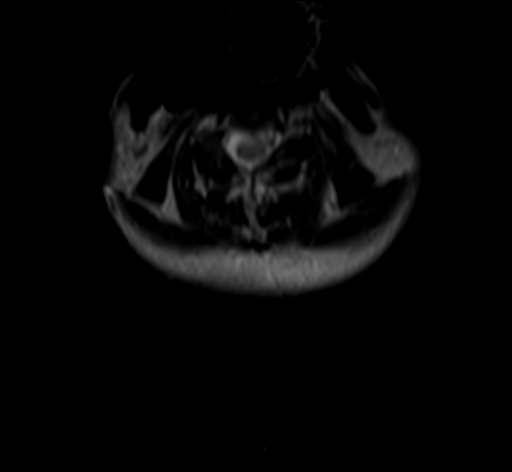

[21 of 48 positions shown; findings below may reference images not displayed]

FINDINGS: Alignment: Physiologic with preservation of the normal thoracic
kyphosis. No listhesis.

Vertebrae: Vertebral body height well maintained without acute or
chronic fracture. Bone marrow signal intensity within normal limits.
No discrete or worrisome osseous lesions. No abnormal marrow edema.

Cord:  Normal signal morphology.

Paraspinal and other soft tissues: Unremarkable.

Disc levels:

T8-9: Disc desiccation with minimal disc bulge and reactive endplate
spurring. Superimposed small chronic Schmorl's node deformity. No
significant spinal stenosis. Foramina remain patent.

Otherwise, no other significant disc pathology seen within the
thoracic spine. Intervertebral discs are otherwise fairly well
hydrated with preserved disc height. No other disc bulge or focal
disc herniation. No other stenosis or neural impingement.
IMPRESSION: 1. Mild degenerative disc disease at T8-9 without stenosis or
impingement.
2. Otherwise normal MRI of the thoracic spine. No other significant
disc pathology, stenosis, or evidence for neural impingement.

## 2021-05-22 ENCOUNTER — Ambulatory Visit: Payer: Medicaid Other

## 2021-05-27 ENCOUNTER — Ambulatory Visit: Payer: Medicaid Other | Attending: Neurosurgery

## 2021-05-27 ENCOUNTER — Other Ambulatory Visit: Payer: Self-pay

## 2021-05-27 ENCOUNTER — Ambulatory Visit: Payer: Medicaid Other

## 2021-05-27 DIAGNOSIS — M545 Low back pain, unspecified: Secondary | ICD-10-CM | POA: Diagnosis present

## 2021-05-27 DIAGNOSIS — M6281 Muscle weakness (generalized): Secondary | ICD-10-CM | POA: Diagnosis present

## 2021-05-27 DIAGNOSIS — M6289 Other specified disorders of muscle: Secondary | ICD-10-CM | POA: Diagnosis present

## 2021-05-27 DIAGNOSIS — M542 Cervicalgia: Secondary | ICD-10-CM | POA: Diagnosis present

## 2021-05-28 NOTE — Therapy (Signed)
[facility] [place], [place], [address] Phone: [phone]   Fax:  [phone]  Physical Therapy Treatment/Re-certification/Re-Authorization  Patient Details  Name: Tina Russell MRN: [id number] Date of Birth: Dec 03, 1980 Referring Provider (PT): Ashok Pall, MD   Encounter Date: 05/27/2021   PT End of Session - 05/28/21 2302     Visit Number 13    Number of Visits 20    Date for PT Re-Evaluation 07/08/21    Authorization Type [place] [employer]    Authorization Time Period Approved 12 PT visits from 04/06/2021-05/17/2021    Authorization - Visit Number 10    Authorization - Number of Visits 12    Progress Note Due on Visit 6    PT Start Time 1122    PT Stop Time 1205    PT Time Calculation (min) 43 min    Activity Tolerance Patient tolerated treatment well    Behavior During Therapy [facility] for tasks assessed/performed             Past Medical History:  Diagnosis Date   ADHD    ADHD    Anxiety    Bipolar disorder ([place])    Cervical spinal stenosis 12/27/2020   C6-7 level   Chronic back pain    Common migraine with intractable migraine 12/27/2020   Depression    Family history of breast cancer    Family history of melanoma    Hypertension    Kidney failure    Migraine    Neuropathy    PTSD (post-traumatic stress disorder)     Past Surgical History:  Procedure Laterality Date   ANTERIOR CERVICAL DECOMP/DISCECTOMY FUSION N/A 01/28/2021   Procedure: Cervical Six-Seven Anterior Cervical Decompression/Discectomy/Fusion;  Surgeon: Ashok Pall, MD;  Location: [facility];  Service: Neurosurgery;  Laterality: N/A;   DILATION AND CURETTAGE OF UTERUS     3 blood tranfusions    There were no vitals filed for this visit.   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  She notes her neck is better since surgery, but progress is slow with good and bad days. Pt reports she is having significant low back pain today, 8/10, and that it has been bothering her more. Pt notes seeing Dr. Cyndy Freeze this week and that the MRI on the thoracic spine was negative and an order has been made for MRI of the the low back.    Pertinent History Significant PMH:  bipolar, PTSD, ADHD    Diagnostic tests 10/29.22: IMPRESSION:  1. Interval ACDF at C6-7. Residual right subarticular osseous  spurring with resultant mild to moderate right-sided spinal stenosis  and flattening of the right hemi cord, but no cord signal changes.  2. Broad central to left paracentral disc protrusion at C5-6 with  resultant mild spinal stenosis and flattening of the ventral cord.  Appearance is mildly progressed from previous.    Currently in Pain? Yes    Pain Score 4     Pain Location Neck    Pain Orientation Posterior    Pain Descriptors / Indicators Tightness;Aching;Tiring    Pain Type Chronic pain    Pain Onset More than a month ago    Pain Frequency Intermittent    Aggravating Factors  Increased activitiy, prolonged standing, prolonged computer work    Pain Relieving  Factors Rest, meds, exs    Pain Score 0    Pain Location Shoulder    Pain Orientation Right                [facility] PT Assessment - 05/28/21 0001       Observation/Other Assessments   Other Surveys  Neck Disability Index    Neck Disability Index  56% disability      AROM   Cervical - Right Rotation 58    Cervical - Left Rotation 42                           [facility] Adult PT Treatment/Exercise - 05/28/21 0001       Neck Exercises: Seated   Neck Retraction 3 secs;5 reps    Cervical Rotation Right;Left;5 reps   3 sec   Cervical Rotation Limitations neck retraction prior    Lateral Flexion Right;Left;5 reps   3 sec   Lateral Flexion Limitations neck retraction                       PT Short Term  Goals - 03/29/21 0602       PT SHORT TERM GOAL #1   Title Jacqualine Code will be >75% HEP compliant throughout therapy to improve carryover between sessions and facilitate independent management of condition    Status Achieved    Target Date 03/29/21               PT Long Term Goals - 05/28/21 2223       PT LONG TERM GOAL #1   Title GURLEEN LARRIVEE will achieve 28 kg grip strength in bil UE    EVAL: L 25 kg, R 23 kg    target date: 04/27/2021    Baseline EVAL: L 25 kg, R 23 kg   -  L 33  R 32 on 11/21    Status Achieved    Target Date 05/01/21      PT LONG TERM GOAL #2   Title CHOSEN GARRON will improve cervical rotation to >/= 45 degrees    EVAL: L: 30, R 15 degrees    target date: 04/27/2021    Baseline EVAL: L: 30, R 15 degrees;    R 40   L42 on 05/01/21; R 58d, L42d on 05/27/21    Status Partially Met    Target Date 07/08/21      PT LONG TERM GOAL #3   Title CLOVA MORLOCK will improve the following MMTs to >/= 4/5 to show improvement in strength:  shoulder ER and flexion    EVAL: unable to test    target date: 04/27/2021    Baseline unable to test. 4+/5 for R shoulder ER and flexion on 05/27/21    Status Achieved    Target Date 05/27/21      PT LONG TERM GOAL #4   Title ADDALINE PEPLINSKI will report >/= 70% decrease in pain from evaluation    EVAL: 10/10 max pain    target date: 04/27/2021. 03/28/21: 1-3/10 for L lower cervical/upper shoulder pain    Baseline 10/10 pain; continued 10/10 pain at worst; 4/10 today with a range 0-7/10 on 05/27/21    Status On-going    Target Date 07/08/21      PT LONG TERM GOAL #5   Title TRISHIA CUTHRELL will improve NDI score to 20 (40% disability) as a proxy  for functional improvement    EVAL: 40 (80% disability)    target date: 04/27/2021    Baseline 20 (40% disability); - 42% disability on 05/01/21. 56% disability on 05/27/21    Status On-going    Target Date 07/08/21                   Plan - 05/28/21 2257     Clinical Impression  Statement Pt's progress re: neck pain and functional ability/tolerance is progressing slower than anticipated, but pt is making gains with decreased neck pain, improved cervical ROM, and improved grip and shoulder strength, see LTGs. Some LTGs have been achieved or partially achieved, while some are still in the process or ongoing. Pt's progress has been impacted by other areas of bodily dysfunction including low back pain and R shoulder pain, which have negatively influenced the pt's neck disability index score. R shoulder pain recently improved after receiving an injection. A request for a referral to eval and treat the pt's low back will be made to the pt's neurosurgeon. Pt will benefit from continued skilled PT to address cervical deficits re: pain, ROM, and activity tolerance to optimize function and to eval and treat low back as a referral is received.    Stability/Clinical Decision Making Stable/Uncomplicated    Clinical Decision Making Low    Rehab Potential Good    PT Frequency 2x / week    PT Duration 6 weeks    PT Treatment/Interventions ADLs/Self Care Home Management;Aquatic Therapy;Iontophoresis 4mg /ml Dexamethasone;Moist Heat;Gait training;Therapeutic activities;Therapeutic exercise;Neuromuscular re-education;Balance training;Dry needling    PT Next Visit Plan Gentle AROM, periscapular progression, cervical progression as appropriate, assess progress with LTGs. Continue TPDN as tolerated. Will request a referral from Dr. Cyndy Freeze for assessment and treatment for low back pain. Progress strengthening with functional activities as tolerated.    PT Home Exercise Plan [id number]    Consulted and Agree with Plan of Care Patient             Patient will benefit from skilled therapeutic intervention in order to improve the following deficits and impairments:  Pain, Impaired UE functional use, Decreased strength, Decreased range of motion  Visit Diagnosis: Cervicalgia  Muscle  weakness  Muscle tightness  Low back pain, unspecified back pain laterality, unspecified chronicity, unspecified whether sciatica present     Problem List Patient Active Problem List   Diagnosis Date Noted   Intermittent left-sided chest pain 04/05/2021   Cervical spondylosis with radiculopathy 01/27/2021   Genetic testing 01/10/2021   Family history of breast cancer 12/28/2020   Family history of melanoma 12/28/2020   Cervical spinal stenosis 12/27/2020   Common migraine with intractable migraine 12/27/2020    Gar Ponto MS, PT 05/28/21 11:36 PM   [facility] [address] [place], [place], [address] Phone: [phone]   Fax:  [phone]  Name: CHANTAVIA BAZZLE MRN: [id number] Date of Birth: 07-01-1980

## 2021-05-29 ENCOUNTER — Other Ambulatory Visit: Payer: Self-pay | Admitting: Neurosurgery

## 2021-05-29 DIAGNOSIS — M5441 Lumbago with sciatica, right side: Secondary | ICD-10-CM

## 2021-06-01 ENCOUNTER — Ambulatory Visit
Admission: RE | Admit: 2021-06-01 | Discharge: 2021-06-01 | Disposition: A | Discharge status: Home / Self Care | Payer: Medicaid Other | Source: Ambulatory Visit | Attending: Hematology and Oncology | Admitting: Hematology and Oncology

## 2021-06-01 ENCOUNTER — Other Ambulatory Visit: Payer: Self-pay | Admitting: Hematology and Oncology

## 2021-06-01 DIAGNOSIS — R9389 Abnormal findings on diagnostic imaging of other specified body structures: Secondary | ICD-10-CM

## 2021-06-01 IMAGING — US US BREAST*L* LIMITED INC AXILLA
1 series · 13 of 25 positions shown · non-contrast
Comparison: MRI [DATE] and earlier exams

CLINICAL DATA: Patient presents for further evaluation of the LEFT
breast after MRI performed [DATE]. Scans performed for strong
family history of breast cancer. Area of concern is in the LOWER
central portion of the LEFT breast, an enhancing mass measuring
x 0.9 centimeters. History of previously biopsied benign
fibroadenoma in the 2 o'clock location of the LEFT breast.

EXAM:
ULTRASOUND OF THE LEFT BREAST

[Series 1: us breast*left* limited inc axilla · 0.04mm/px · 13 of 34 slices shown]
[im 1/34]
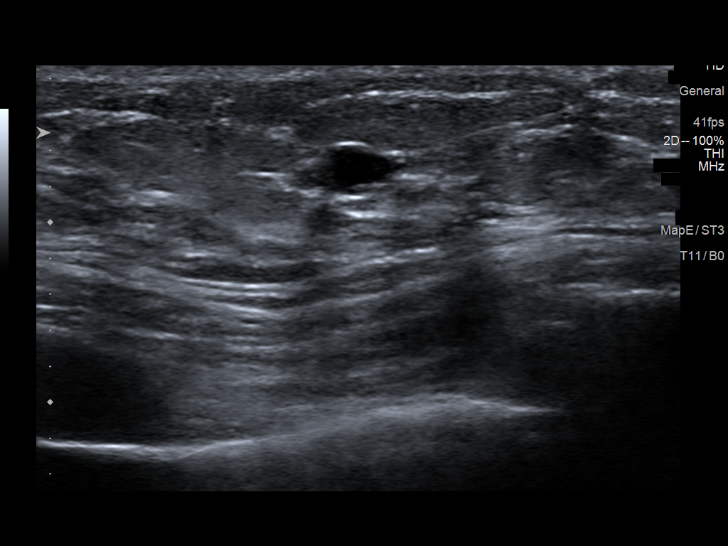
[im 3/34]
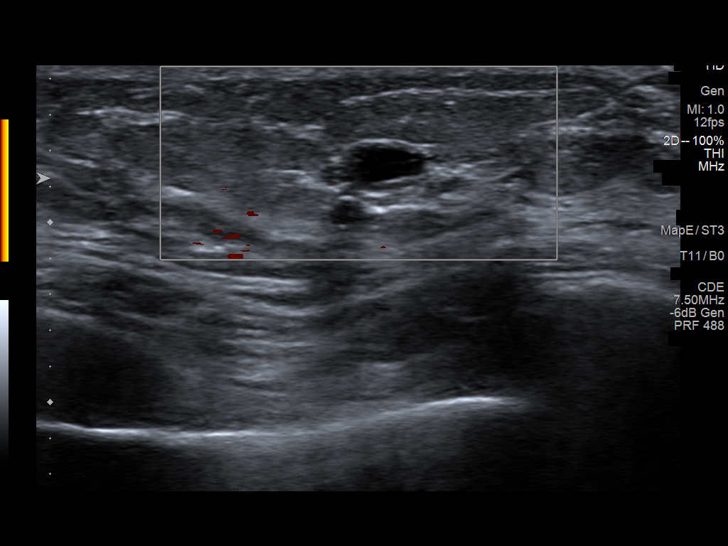
[im 6/34]
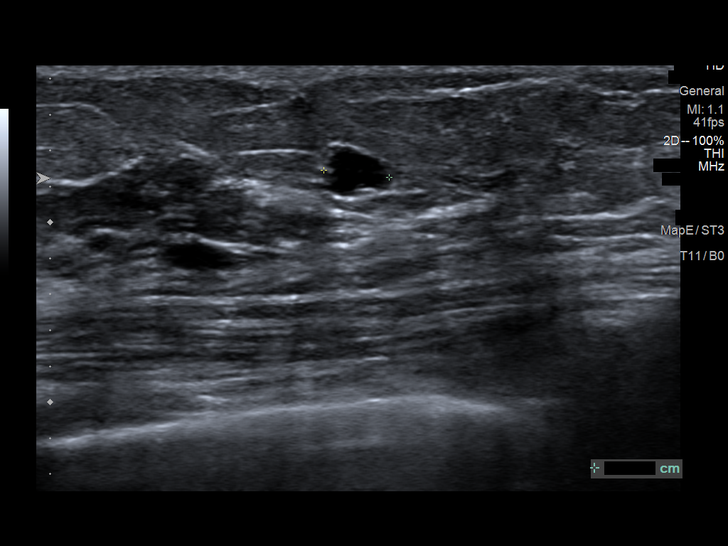
[im 9/34]
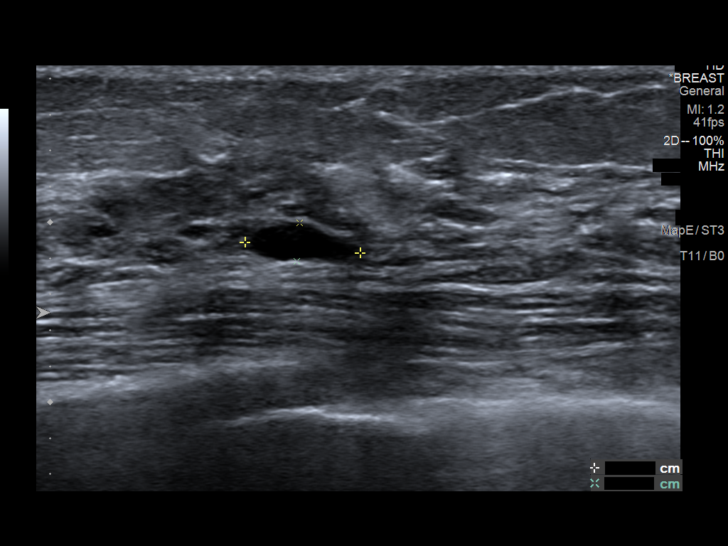
[im 12/34]
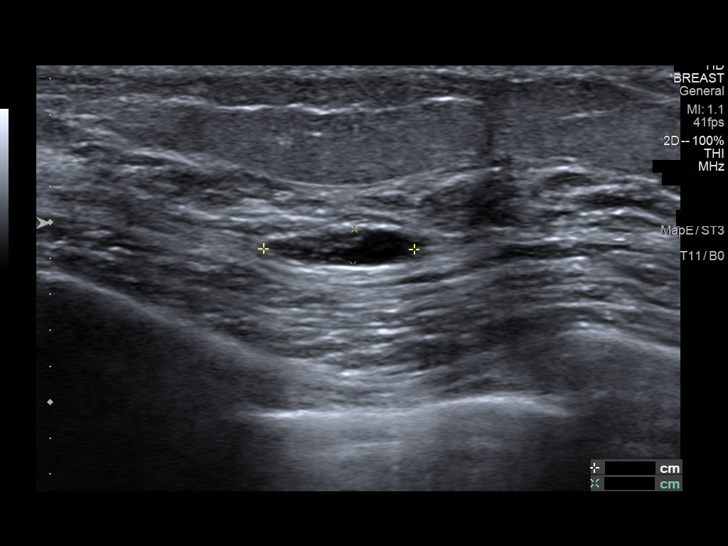
[im 14/34]
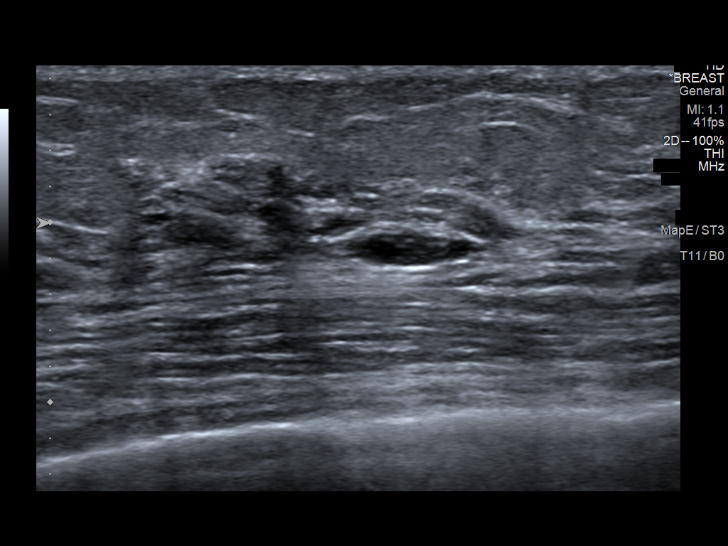
[im 17/34]
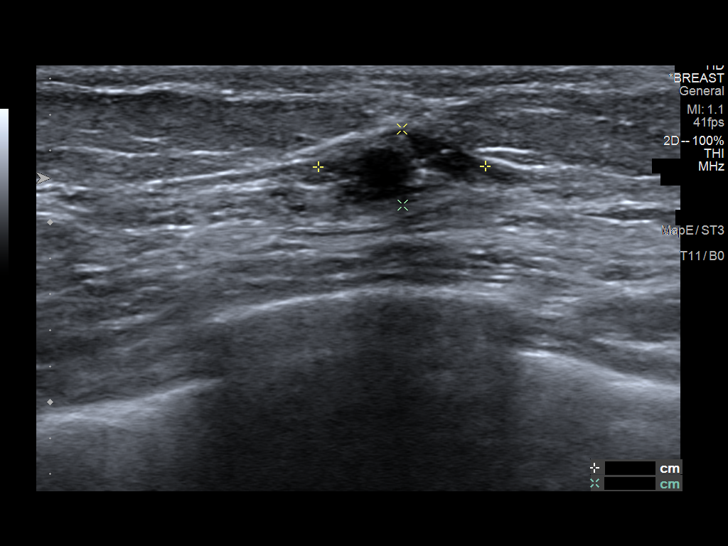
[im 20/34]
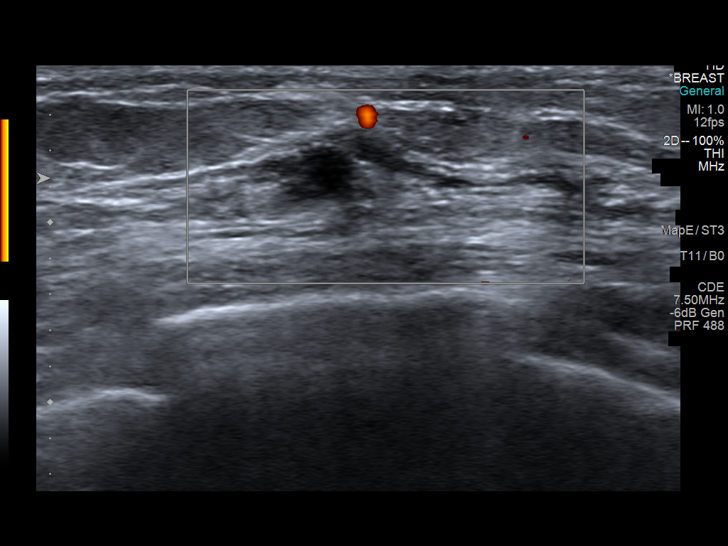
[im 23/34]
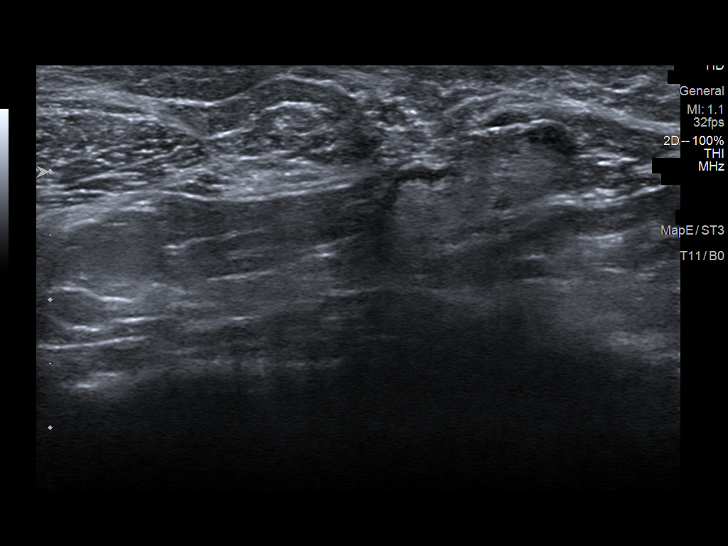
[im 25/34]
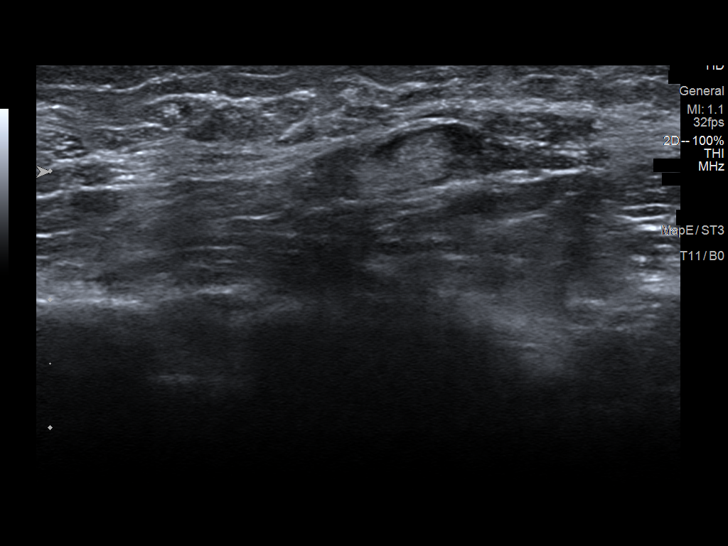
[im 28/34]
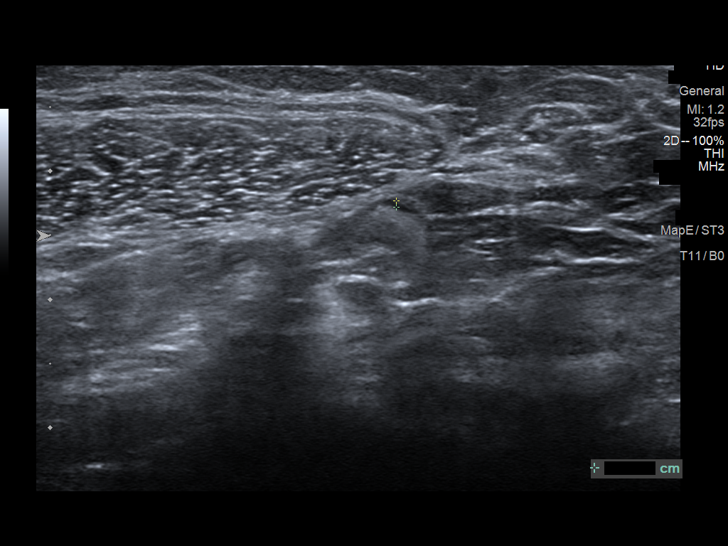
[im 31/34]
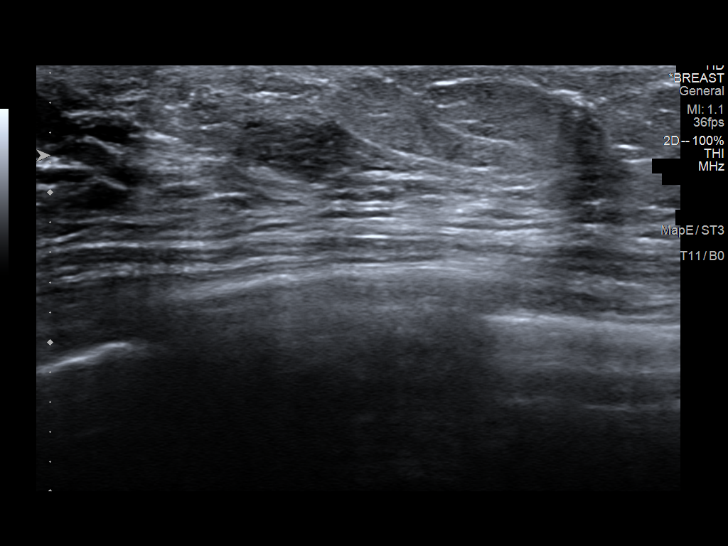
[im 34/34]
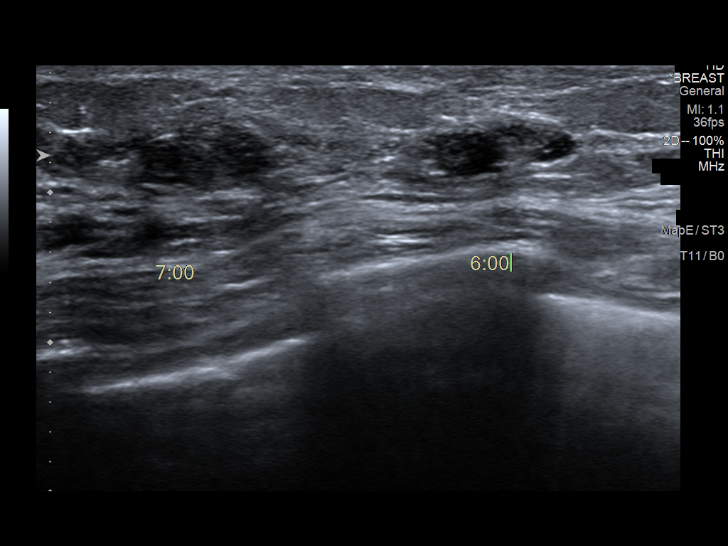

[13 of 25 positions shown; findings below may reference images not displayed]

FINDINGS: On physical exam, I palpate no abnormality in the LOWER central LEFT
breast.

Targeted ultrasound is performed, showing a solid oval parallel mass
with angular margins in the 7 o'clock location of LEFT breast 1
centimeter from the nipple measuring 1.1 x 0.5 x 0.8 centimeters.
Internal blood flow is identified on Doppler evaluation.

Scattered cysts are identified in the LOWER and INNER portions of
the RIGHT breast.

Evaluation of the LEFT axilla is negative for adenopathy.
IMPRESSION: Indeterminate solid mass in the 7 o'clock location of the LEFT
breast 1 centimeter from the nipple, likely representing the finding
seen on MRI.

No LEFT axillary adenopathy.

RECOMMENDATION:
Recommend ultrasound-guided core biopsy of the LEFT breast.

I have discussed the findings and recommendations with the patient.
If applicable, a reminder letter will be sent to the patient
regarding the next appointment.

BI-RADS CATEGORY  4: Suspicious.

## 2021-06-07 ENCOUNTER — Telehealth: Payer: Self-pay | Admitting: Hematology and Oncology

## 2021-06-07 NOTE — Telephone Encounter (Signed)
Scheduled appointment per 12/27 staff message. Patient aware.

## 2021-06-21 ENCOUNTER — Ambulatory Visit
Admission: RE | Admit: 2021-06-21 | Discharge: 2021-06-21 | Disposition: A | Discharge status: Home / Self Care | Payer: Medicaid Other | Source: Ambulatory Visit | Attending: Hematology and Oncology | Admitting: Hematology and Oncology

## 2021-06-21 DIAGNOSIS — R9389 Abnormal findings on diagnostic imaging of other specified body structures: Secondary | ICD-10-CM

## 2021-06-21 HISTORY — PX: BREAST BIOPSY: SHX20

## 2021-06-21 IMAGING — US US BREAST BX W LOC DEV 1ST LESION IMG BX SPEC US GUIDE*L*
1 series · 12 of 12 positions shown · non-contrast
Comparison: Previous exam(s).
COMPARISON: Previous exam(s).

Addendum:
CLINICAL DATA: Patient with indeterminate mass in the LEFT breast
at 7 o'clock axis presents today for ultrasound-guided core biopsy.

EXAM:
ULTRASOUND GUIDED LEFT BREAST CORE NEEDLE BIOPSY

[Series 1: us breast bx w loc dev 1st lesion img bx spec us g · 0.06mm/px · 12 of 12 slices shown]
[im 1/12]
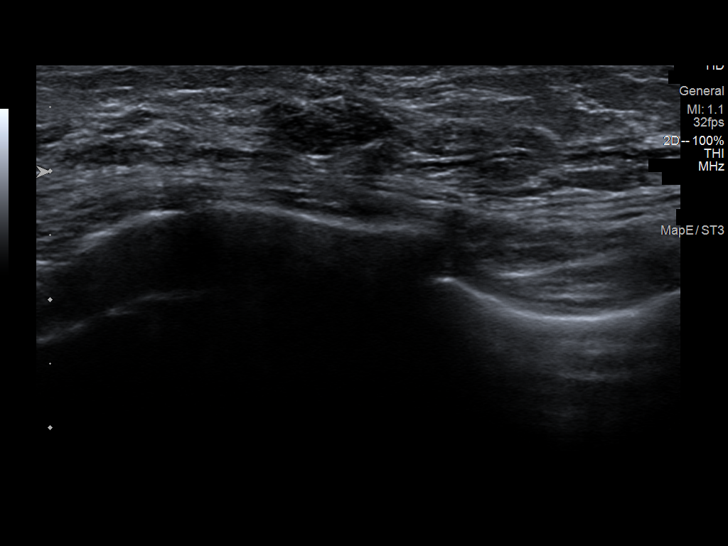
[im 2/12]
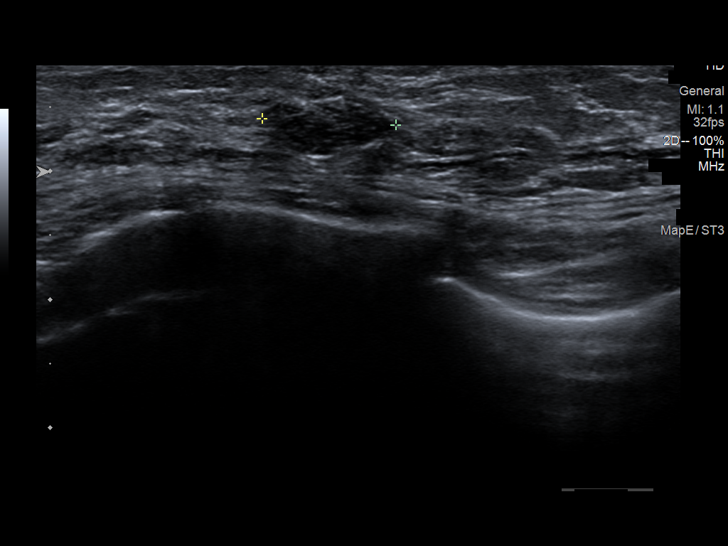
[im 3/12]
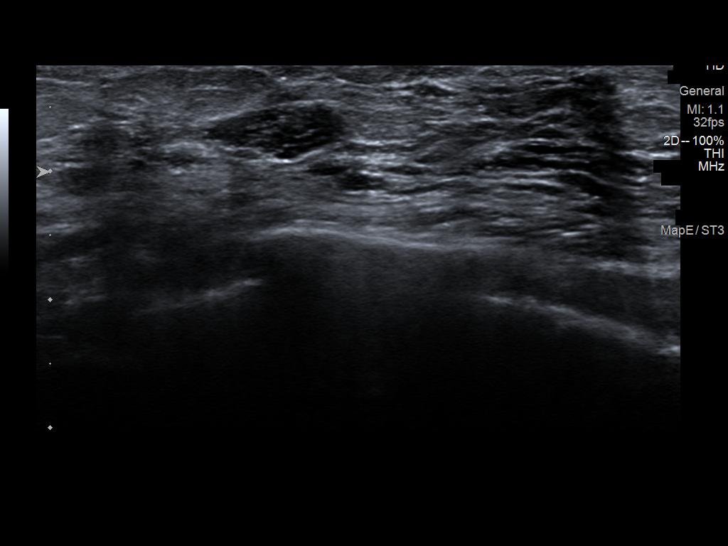
[im 4/12]
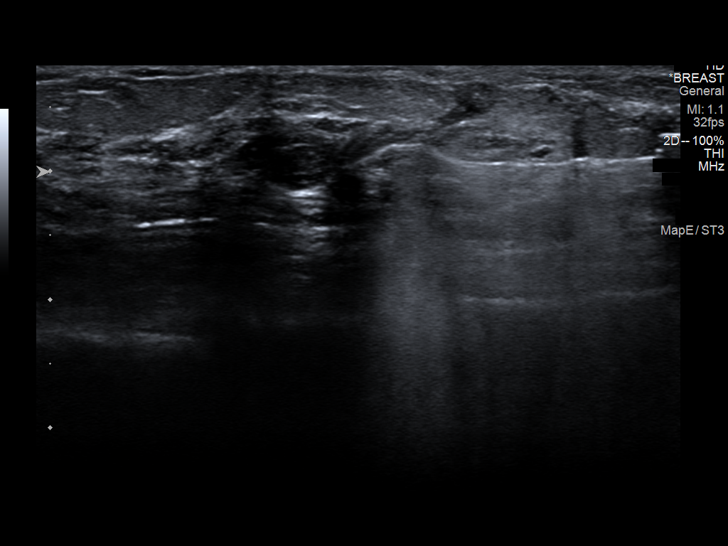
[im 5/12]
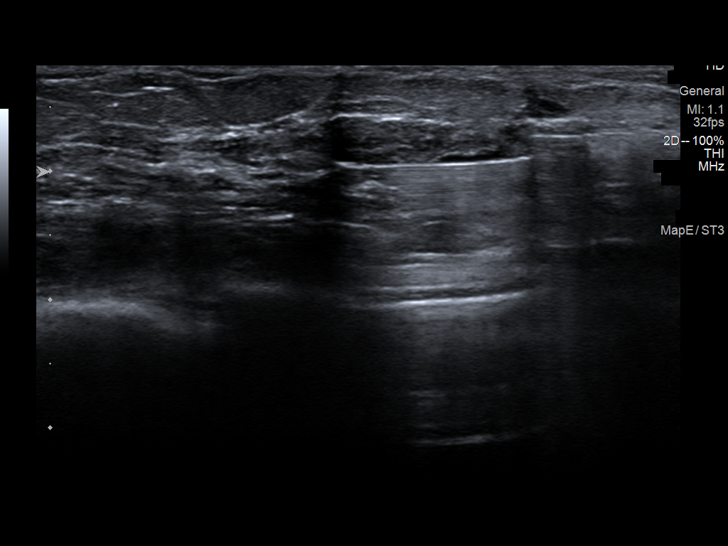
[im 6/12]
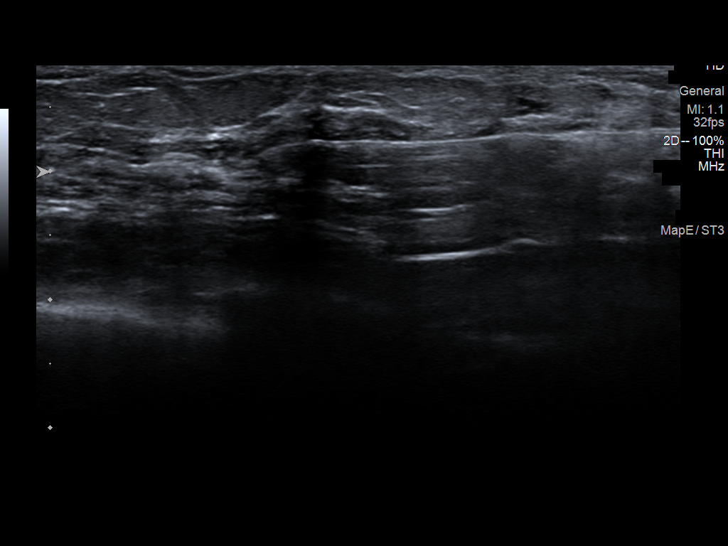
[im 7/12]
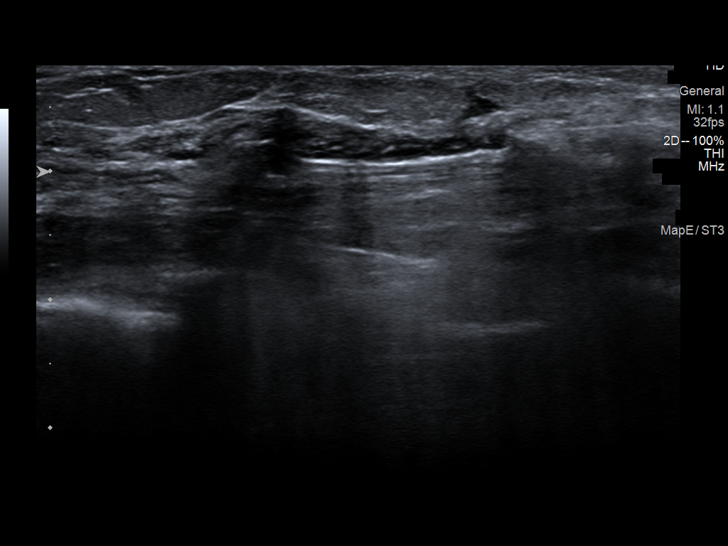
[im 8/12]
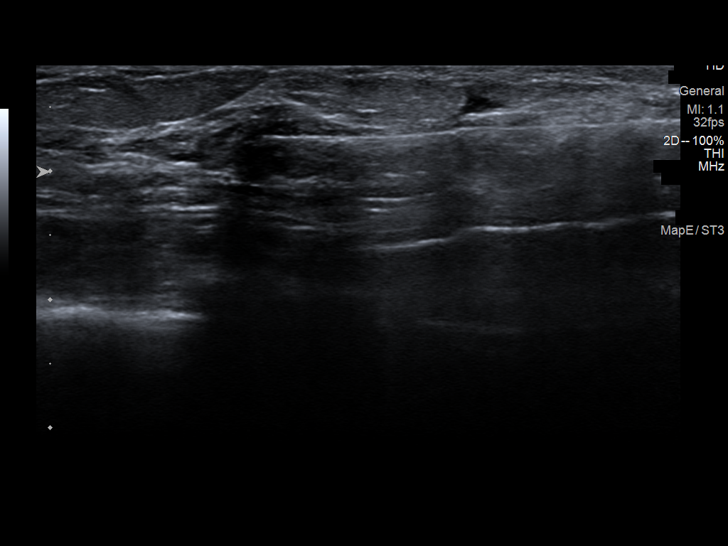
[im 9/12]
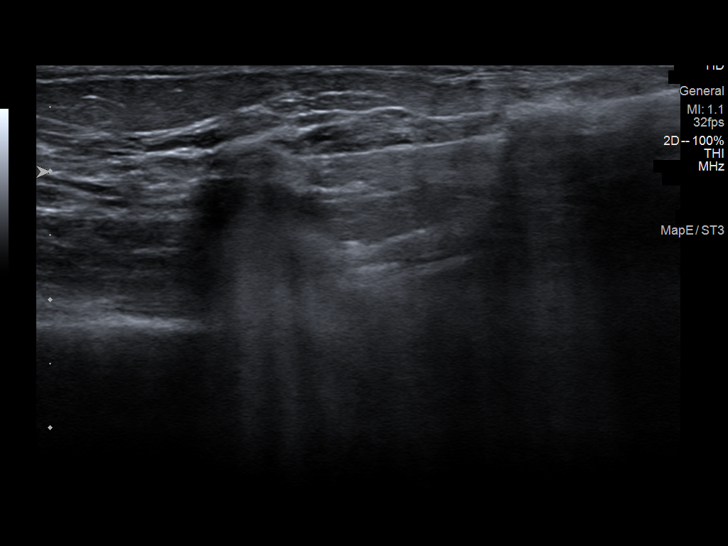
[im 10/12]
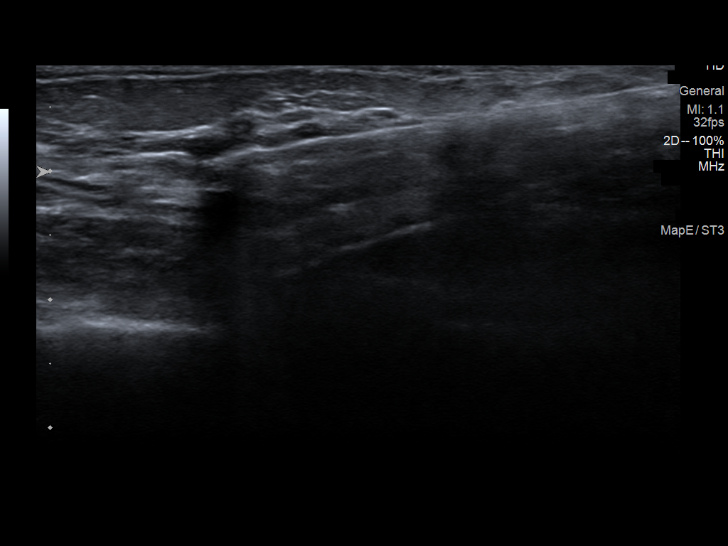
[im 11/12]
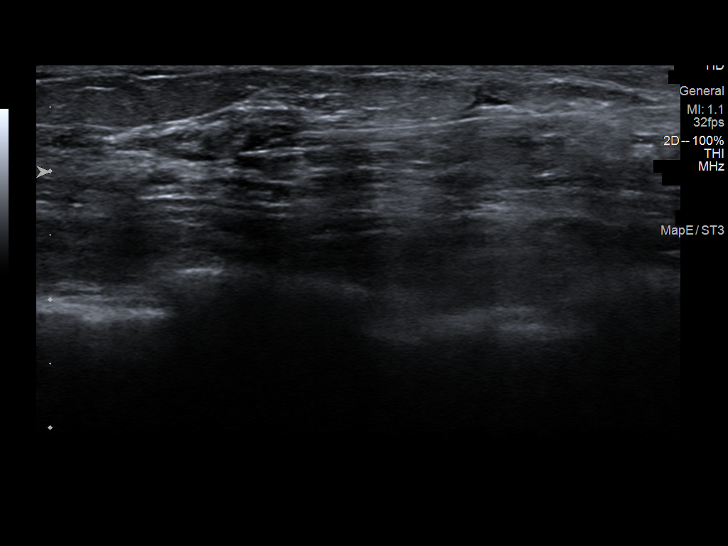
[im 12/12]
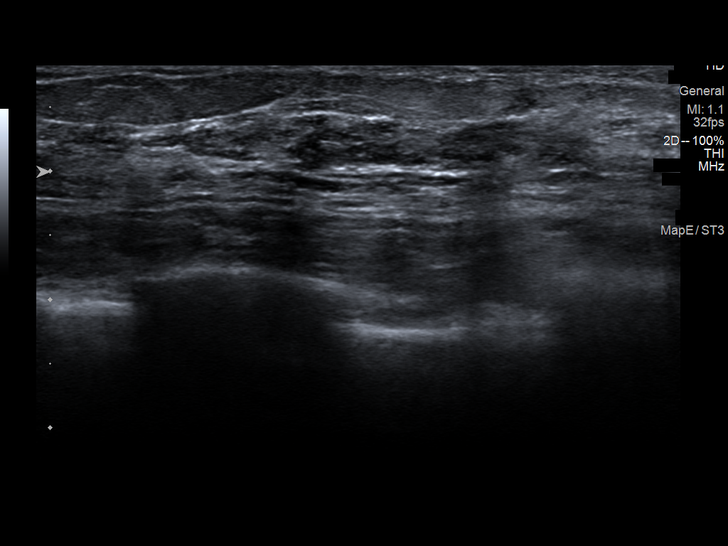

[12 of 12 positions shown; findings below may reference images not displayed]



Lesion quadrant: Lower inner quadrant

Using sterile technique and 1% Lidocaine as local anesthetic, under
direct ultrasound visualization, a 12 gauge FOYALISLAM device was
used to perform biopsy of the LEFT breast mass at the 7 o'clock
axis, 1 cm from the nipple, using a lateral approach. At the
conclusion of the procedure coil shaped tissue marker clip was
deployed into the biopsy cavity. Follow up 2 view mammogram was
performed and dictated separately.
IMPRESSION: Ultrasound guided biopsy of the LEFT breast mass at the 7 o'clock
axis. No apparent complications.

ADDENDUM:
Pathology revealed FLORID USUAL DUCTAL HYPERPLASIA AND FIBROCYSTIC
CHANGES INCLUDING APOCRINE METAPLASIA of the LEFT breast, 7 o'clock,
1 cmfn, (coil clip). This was found to be concordant by Dr. FOYALISLAM
FOYALISLAM.

Pathology results were discussed with the patient by telephone. The
patient reported doing well after the biopsy with tenderness at the
site. Post biopsy instructions and care were reviewed and questions
were answered. The patient was encouraged to call The [REDACTED] for any additional concerns. My direct phone
number was provided.

Recommend followup breast MRI now to ensure sonographic and MRI
correspondence for today's biopsy. If today's biopsy clip
corresponds to mass seen initially on MRI, then patient can return
to routine annual screening mammograms, (next bilateral screening
mammogram due in [DATE]), and annual screening breast MRI. This
was discussed with the patient, and she verbalized an understanding
of these recommendations.

Dr. FOYALISLAM was notified of biopsy results and
recommendations via [REDACTED] message on [DATE].

Pathology results reported by FOYALISLAM, RN on [DATE].



Lesion quadrant: Lower inner quadrant

Using sterile technique and 1% Lidocaine as local anesthetic, under
direct ultrasound visualization, a 12 gauge FOYALISLAM device was
used to perform biopsy of the LEFT breast mass at the 7 o'clock
axis, 1 cm from the nipple, using a lateral approach. At the
conclusion of the procedure coil shaped tissue marker clip was
deployed into the biopsy cavity. Follow up 2 view mammogram was
performed and dictated separately.
IMPRESSION: Ultrasound guided biopsy of the LEFT breast mass at the 7 o'clock
axis. No apparent complications.

## 2021-06-21 IMAGING — MG MM BREAST LOCALIZATION CLIP
4 series · 4 of 12 positions shown · non-contrast
Comparison: Previous exam(s).

CLINICAL DATA: Status post ultrasound-guided biopsy for an
indeterminate mass within the LEFT breast at the 7 o'clock axis.

EXAM:
3D DIAGNOSTIC LEFT MAMMOGRAM POST ULTRASOUND BIOPSY

[L ML synth-2D]
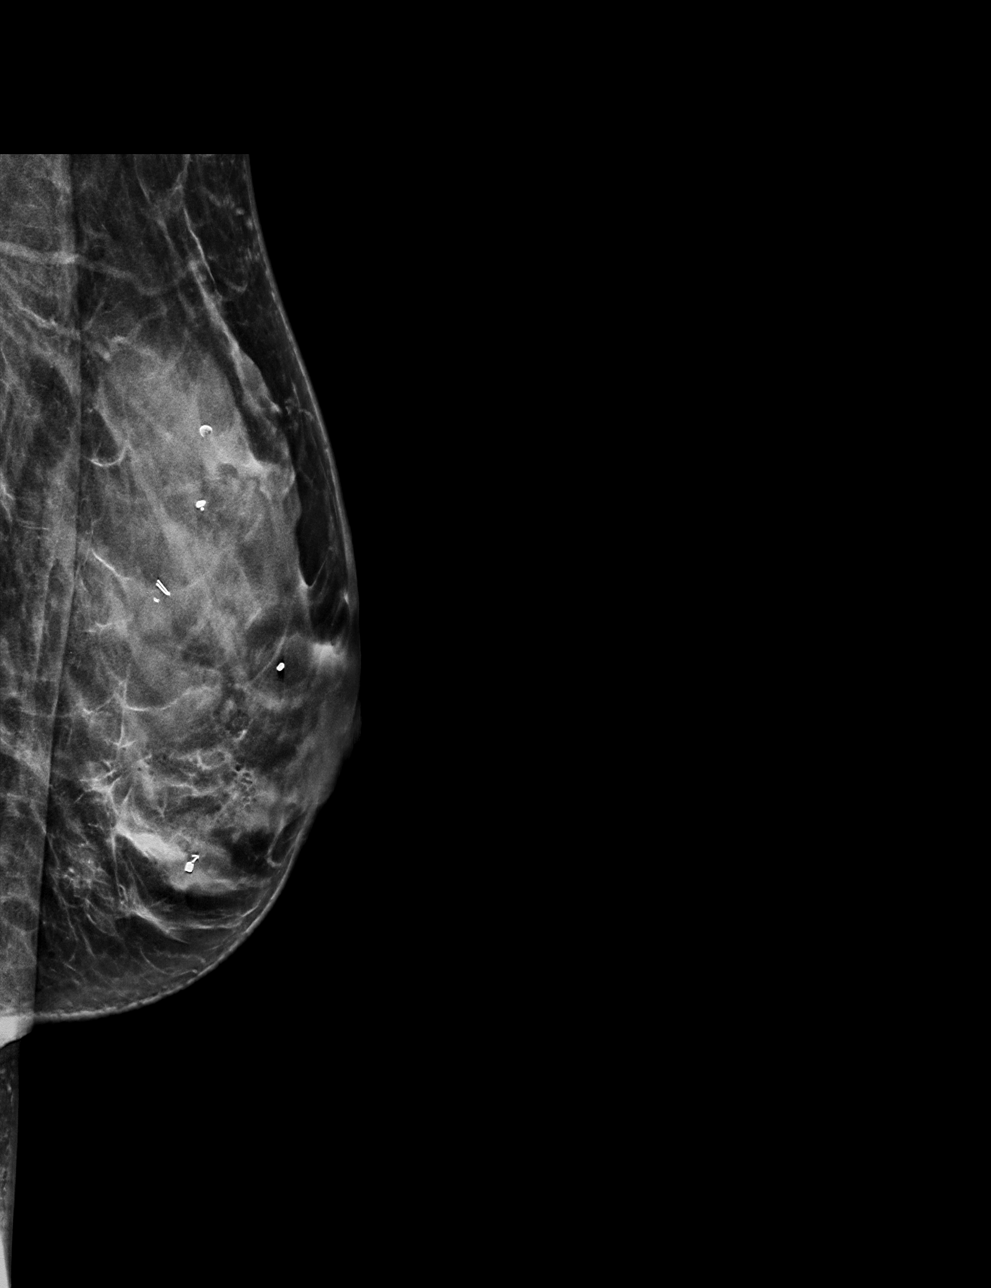

[L CC synth-2D]
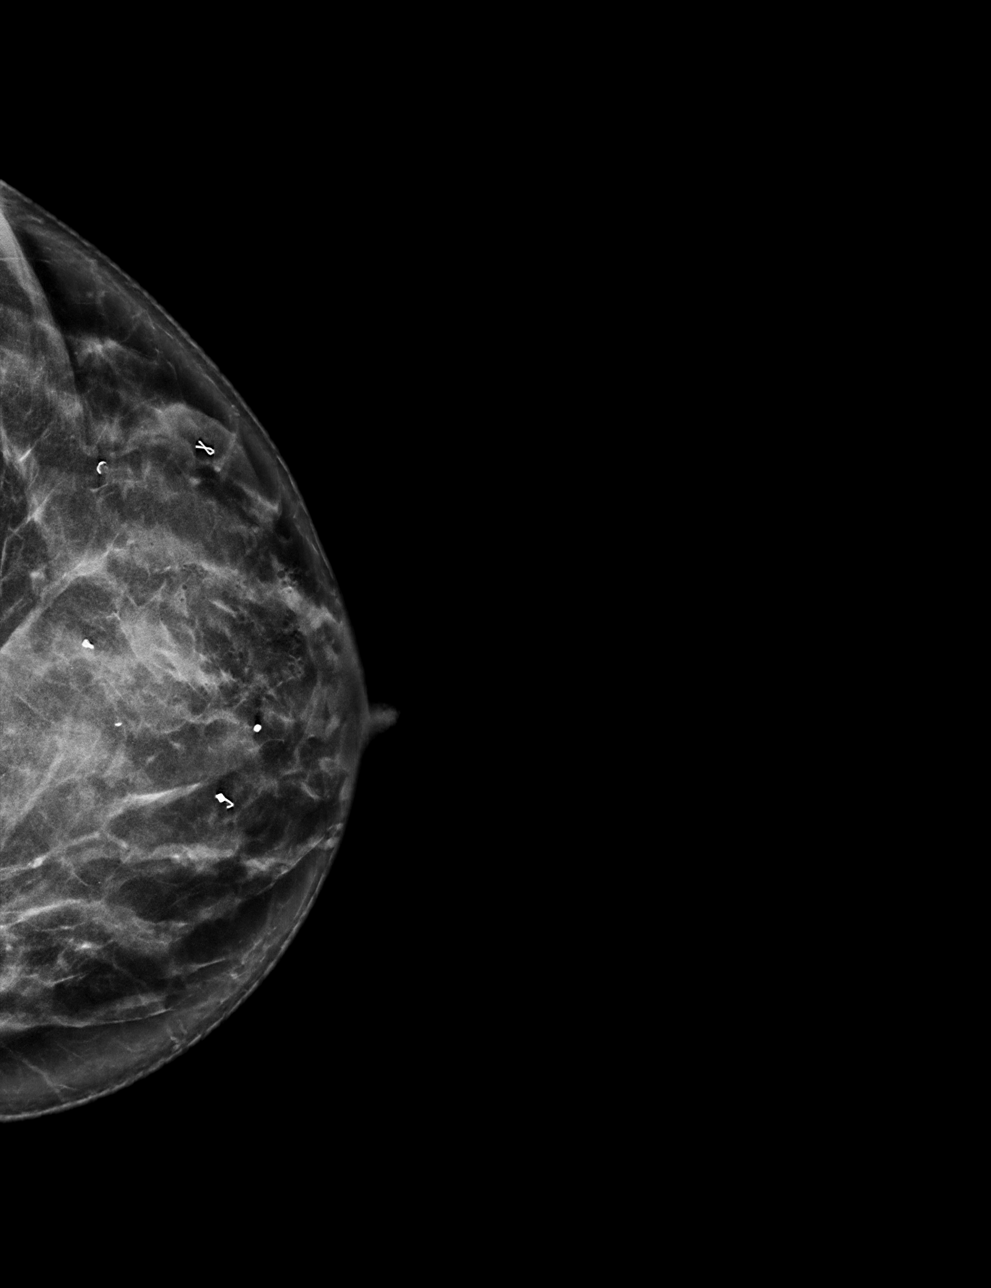

[L ML tomo · tomo slice 28/55.0]
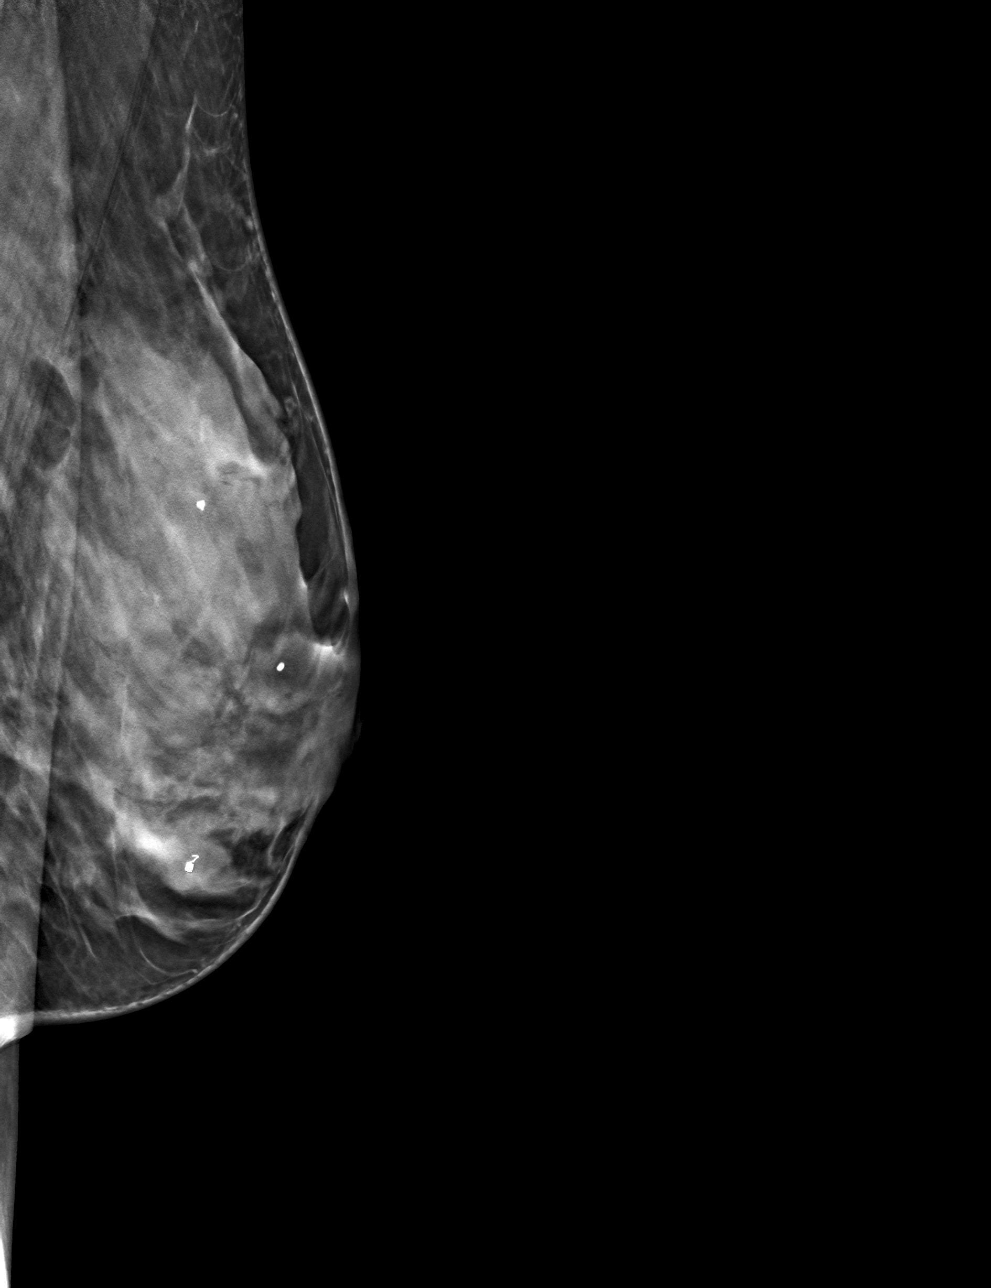

[L CC tomo · tomo slice 27/54.0]
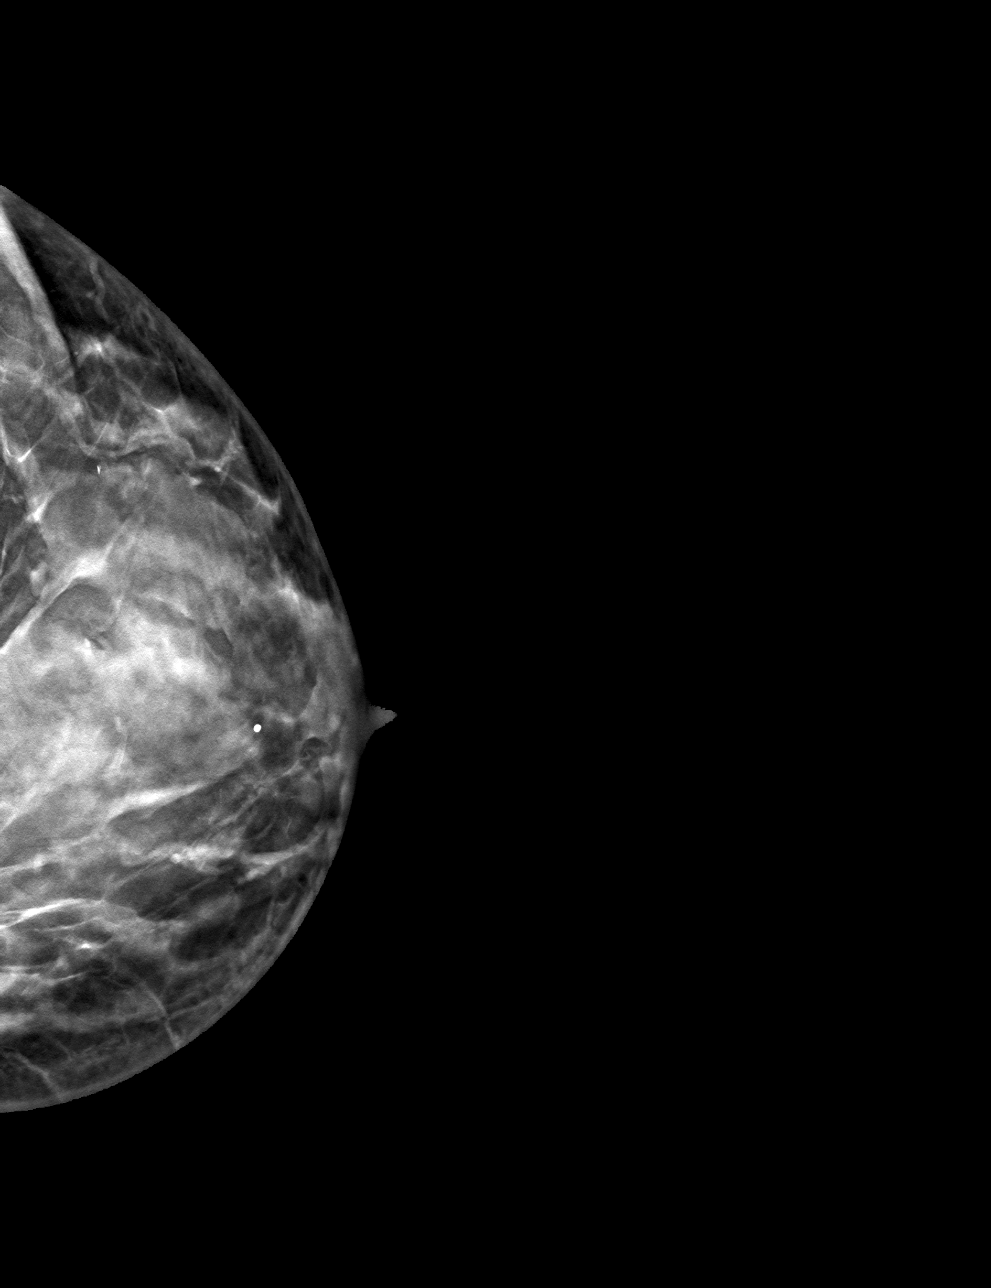

[4 of 12 positions shown; findings below may reference images not displayed]

FINDINGS: 3D Mammographic images were obtained following ultrasound guided
biopsy of the LEFT breast mass at the 7 o'clock axis. The biopsy
marking clip is in expected position at the site of biopsy.
IMPRESSION: Appropriate positioning of the coil shaped biopsy marking clip at
the site of biopsy in the lower inner quadrant of the LEFT breast
corresponding to the targeted mass at the 7 o'clock axis.

Final Assessment: Post Procedure Mammograms for Marker Placement

## 2021-06-25 ENCOUNTER — Ambulatory Visit
Admission: RE | Admit: 2021-06-25 | Discharge: 2021-06-25 | Disposition: A | Discharge status: Home / Self Care | Payer: Medicaid Other | Source: Ambulatory Visit | Attending: Neurosurgery | Admitting: Neurosurgery

## 2021-06-25 ENCOUNTER — Other Ambulatory Visit: Payer: Self-pay

## 2021-06-25 DIAGNOSIS — M5441 Lumbago with sciatica, right side: Secondary | ICD-10-CM

## 2021-06-25 IMAGING — MR MR LUMBAR SPINE W/O CM
4 of 5 series · 18 of 48 positions shown · non-contrast
Comparison: Radiographs dated [DATE]

CLINICAL DATA: Lower back pain left greater than the right. Fell
into the pool over the [REDACTED].n

EXAM:
MRI LUMBAR SPINE WITHOUT CONTRAST
TECHNIQUE: Multiplanar, multisequence MR imaging of the lumbar spine was
performed. No intravenous contrast was administered.

[Series 5: T2 · sagittal · 4.0mm · 0.73mm/px · 6 of 15 slices shown (1 of 2)]
[im 1/15]
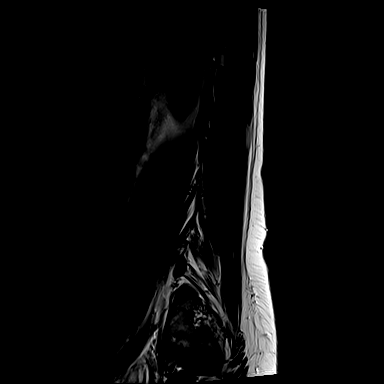
[im 3/15]
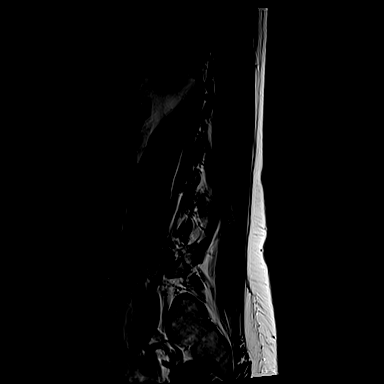
[im 6/15]
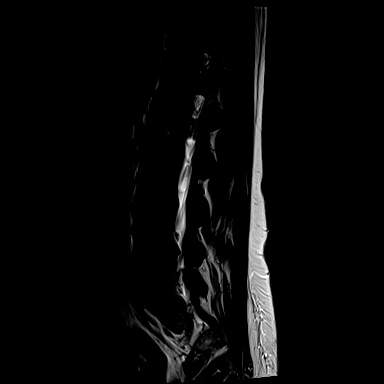
[im 9/15]
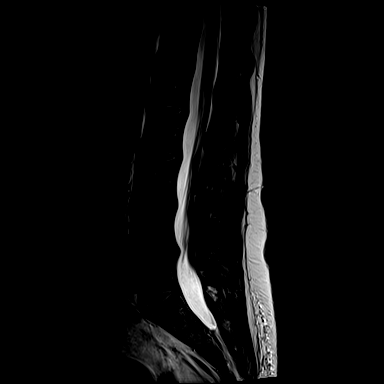
[im 12/15]
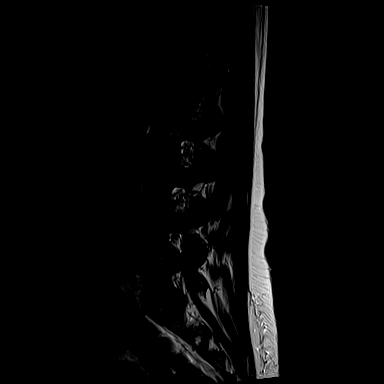
[im 15/15]
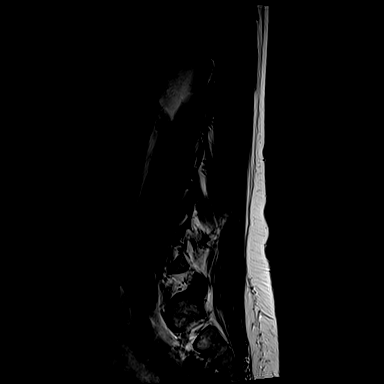

[Series 6: T1 · sagittal · 4.0mm · 0.73mm/px · 3 of 15 slices shown (1 of 2)]
[im 3/15]
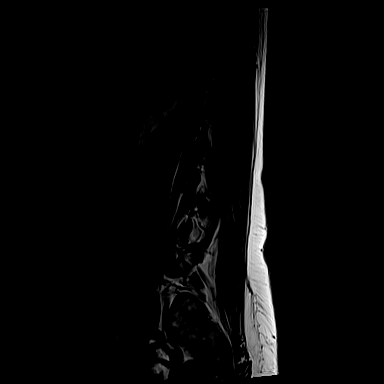
[im 9/15]
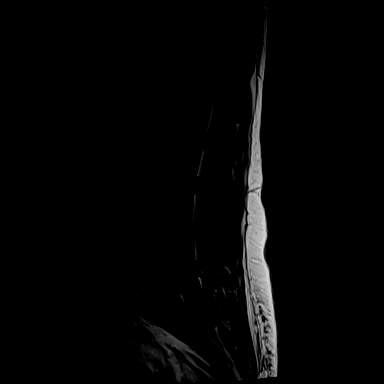
[im 15/15]
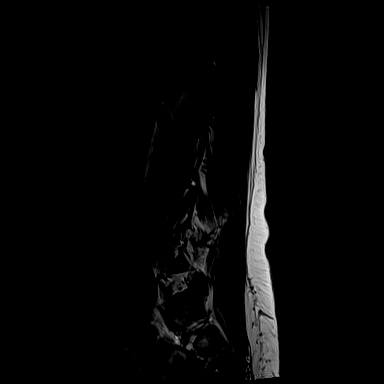

[Series 12: T2 · axial · 4.0mm · 0.28mm/px · z∈[-12,+178]mm · 6 of 41 slices shown (2 of 2)]
[im 1/41]
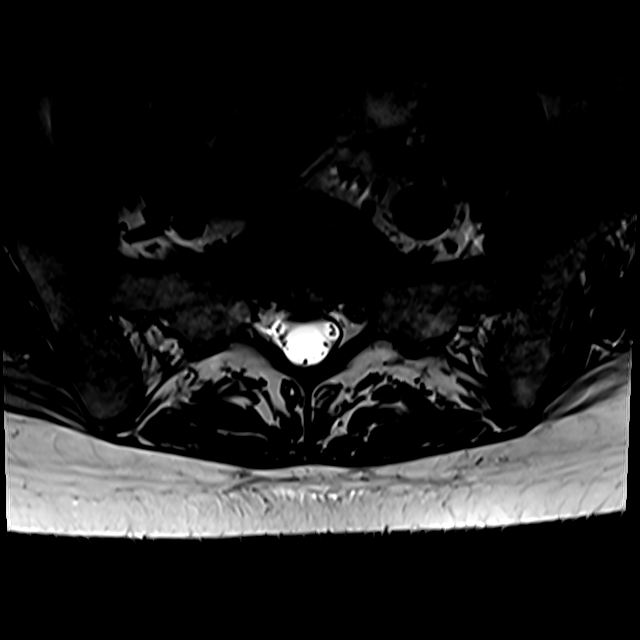
[im 6/41]
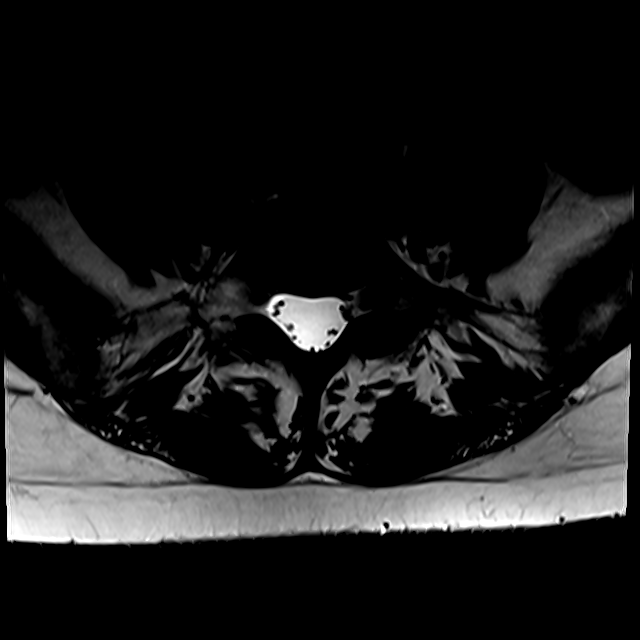
[im 12/41]
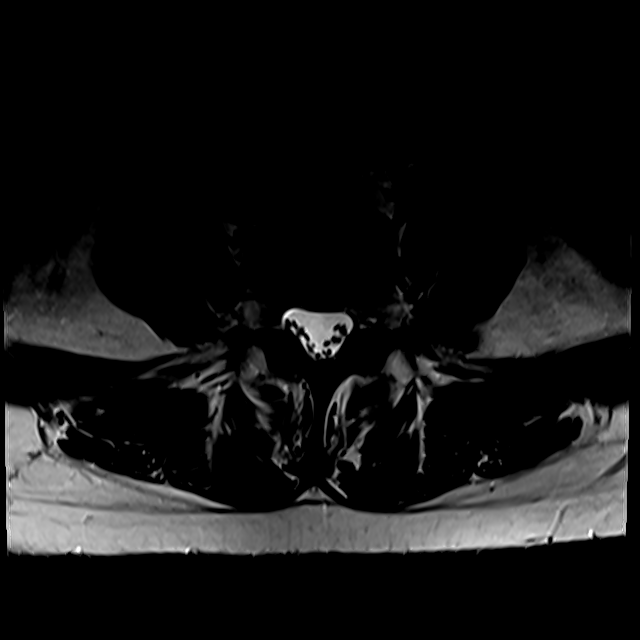
[im 18/41]
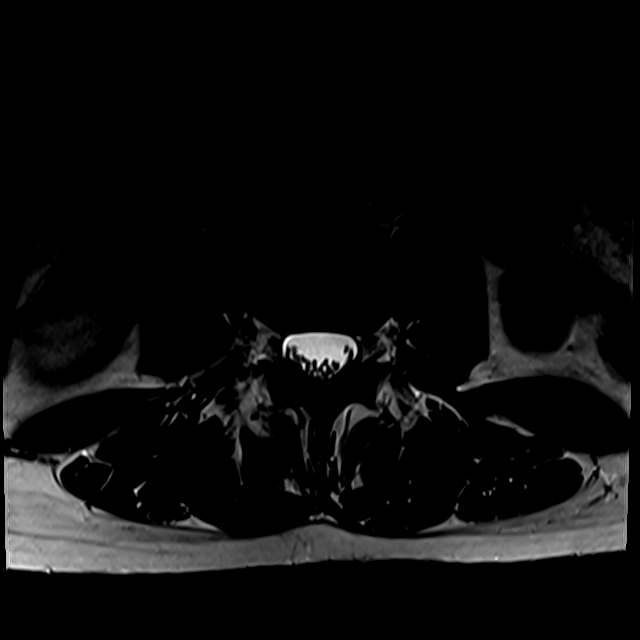
[im 21/41]
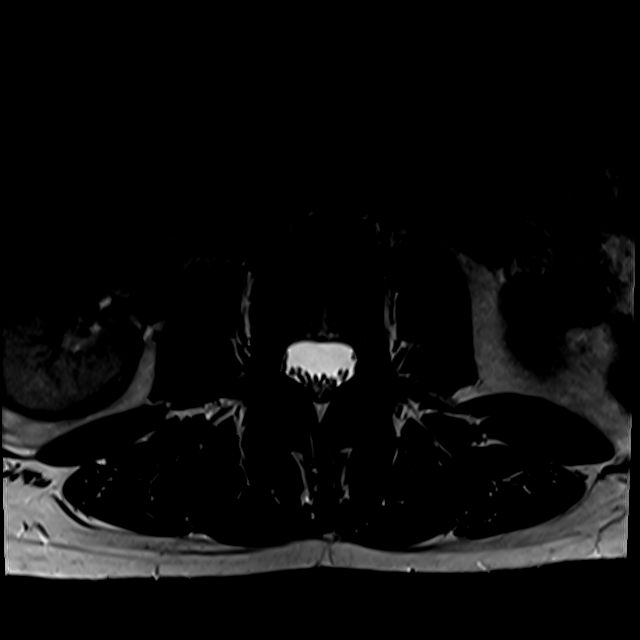
[im 35/41]
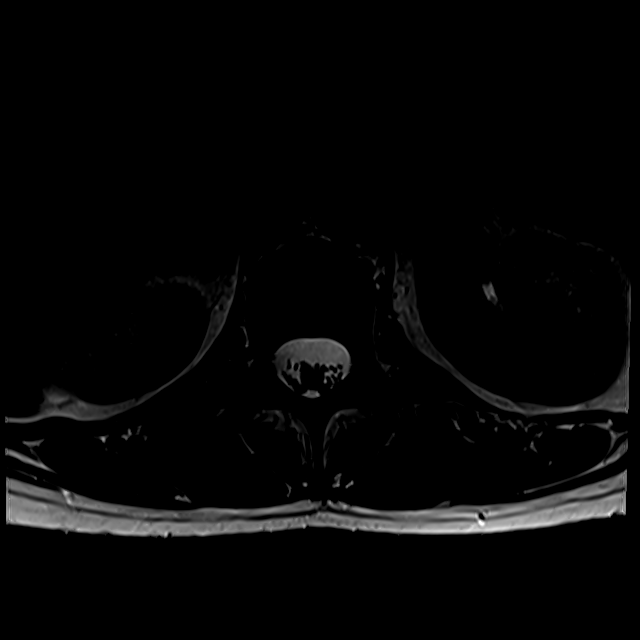

[Series 100: T1 · axial · 4.0mm · 0.28mm/px · z∈[+12,+178]mm · 3 of 41 slices shown (2 of 2)]
[im 6/41]
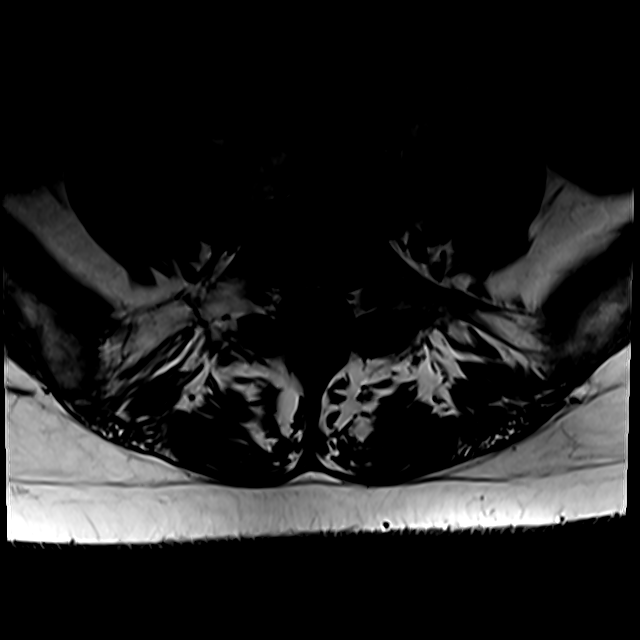
[im 21/41]
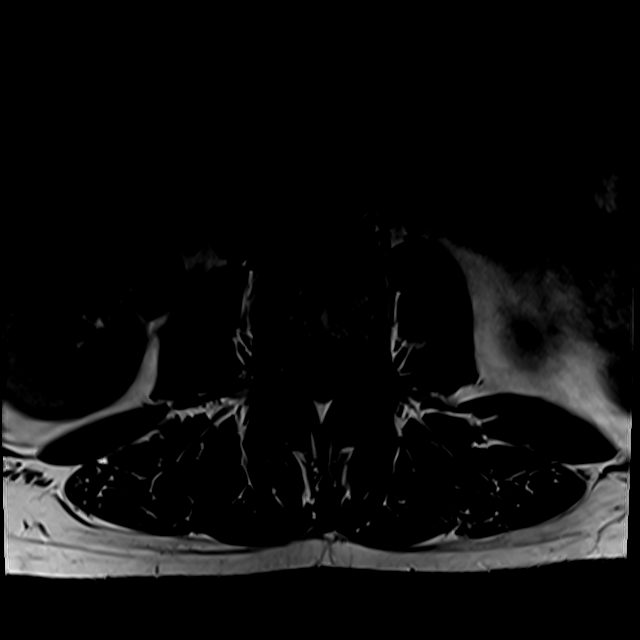
[im 35/41]
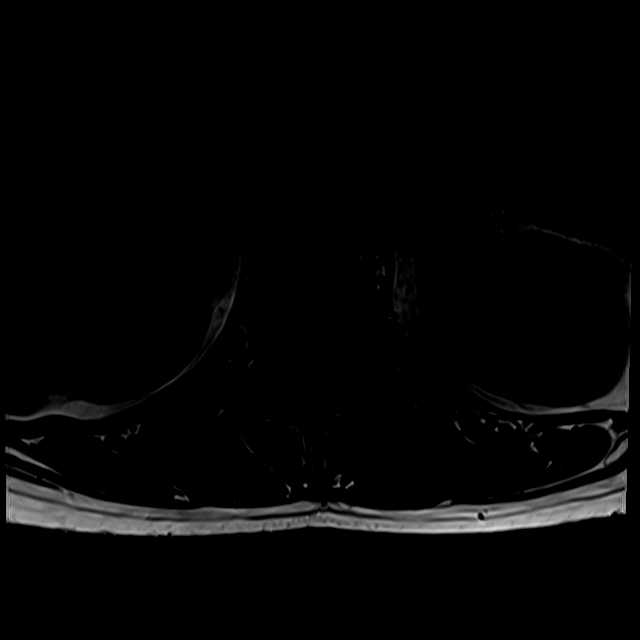

[18 of 48 positions shown; findings below may reference images not displayed]

FINDINGS: Segmentation:  Standard.

Alignment:  Physiologic.

Vertebrae:  No fracture, evidence of discitis, or bone lesion.

Conus medullaris and cauda equina: Conus extends to the L1 level.
Conus and cauda equina appear normal.

Paraspinal and other soft tissues: Negative.

Disc spaces:

T12-L1: No significant disc bulge. No neural foraminal stenosis. No
central canal stenosis.

L1-L2: No significant disc bulge. No neural foraminal stenosis. No
central canal stenosis.

L2-L3: Circumferential disc bulge and ligamentum flavum hypertrophy
narrowing of the spinal canal. Bilateral facet joint arthropathy. No
significant neural foraminal narrowing.

L3-L4: Mild circumferential disc protrusion and ligamentum flavum
hypertrophy. Moderate facet joint arthropathy. No significant neural
foraminal narrowing.

L4-L5: Disc desiccation. Eccentric disc protrusion and ligamentum
flavum hypertrophy with facet joint arthropathy, moderate spinal
canal narrowing. Mild right and moderate left neural foraminal
narrowing

L5-S1: No significant disc bulge. No neural foraminal stenosis. No
central canal stenosis.
IMPRESSION: 1. Multilevel degenerative disc disease with narrowing of bilateral
neural foramina, left worse than the right at L3-L4.

2.  No acute fracture or subluxation.

3.  Visualized cord and cauda equina are within normal limits.

## 2021-06-26 ENCOUNTER — Telehealth: Payer: Self-pay | Admitting: Hematology and Oncology

## 2021-06-26 ENCOUNTER — Inpatient Hospital Stay: Payer: Medicaid Other | Attending: Genetic Counselor | Admitting: Hematology and Oncology

## 2021-06-26 ENCOUNTER — Encounter: Payer: Self-pay | Admitting: Hematology and Oncology

## 2021-06-26 VITALS — BP 143/93 | HR 72 | Temp 97.9°F | Resp 17 | Wt 153.8 lb

## 2021-06-26 DIAGNOSIS — Z1501 Genetic susceptibility to malignant neoplasm of breast: Secondary | ICD-10-CM | POA: Diagnosis present

## 2021-06-26 DIAGNOSIS — N6082 Other benign mammary dysplasias of left breast: Secondary | ICD-10-CM | POA: Diagnosis present

## 2021-06-26 DIAGNOSIS — Z9189 Other specified personal risk factors, not elsewhere classified: Secondary | ICD-10-CM

## 2021-06-26 DIAGNOSIS — R928 Other abnormal and inconclusive findings on diagnostic imaging of breast: Secondary | ICD-10-CM

## 2021-06-26 DIAGNOSIS — Z87891 Personal history of nicotine dependence: Secondary | ICD-10-CM | POA: Insufficient documentation

## 2021-06-26 DIAGNOSIS — Z803 Family history of malignant neoplasm of breast: Secondary | ICD-10-CM | POA: Diagnosis present
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
## 2021-06-26 NOTE — Telephone Encounter (Signed)
Scheduled appointment per 1/16 scheduling message. Patient is aware. °

## 2021-07-04 NOTE — Progress Notes (Signed)
PATIENT: Tina Russell DOB: 1981-05-25  REASON FOR VISIT: Follow up for neck pain, back pain, paresthesia HISTORY FROM: Patient PRIMARY NEUROLOGIST: Dr. Marjory Lies   HISTORY OF PRESENT ILLNESS: Today 07/05/21 Tina Russell is here today for follow-up with history of cervical spine decompression surgery August 2022.   Saw Dr. Franky Macho 07/03/21, had MRI lumbar spine, showed multilevel DDD with narrowing of bilateral neural foramina, left worse than right at L3-L4. Didn't recommend surgery, will be having injections 08/03/21 at [facility]. Most of pain is low back. Has discovered she has ADHD. Is in between psychiatrists now. Is on Lamictal, is taking herself off it, because isn't doing what she needs it to. Her son has a lot of her same issues, he is 40. Feels she needs ADHD medicine. 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 Finished PT for the neck, waiting for order for low back PT from Dr. Franky Macho.  Is restricted through [facility] on who can provide pain medications. Has appointment for new psychiatrist. Does have migraines on the left side of head. In past has tried, Imitrex, diclofenac didn't work.   HISTORY 04/11/21 Dr. Marjory Lies: 41 year old with cervical spine stenosis status post decompression in August 2022.  Patient was having issues with shoulder pain, numbness and tingling.  She was found to have cervical spinal stenosis which was treated surgically.  She had good result.  She continues to have issues with numbness in her hands, neck pain, shoulder pain, low back pain.  She has been going through physical therapy exercises.  She previously ran cross-country track and have been active in taekwondo until these issues.  She is looking forward to getting back to these activities when possible.  REVIEW OF SYSTEMS: Out of a complete 14 system review of symptoms, the patient complains only of the following symptoms, and all other reviewed systems are  negative.  See HPI  ALLERGIES: Allergies  Allergen Reactions   Amoxicillin-Pot Clavulanate Rash   Strattera [Atomoxetine Hcl] Hypertension    HOME MEDICATIONS: Outpatient Medications Prior to Visit  Medication Sig Dispense Refill   cloNIDine (CATAPRES) 0.1 MG tablet Take 0.2 mg by mouth 2 (two) times daily.     lamoTRIgine (LAMICTAL) 200 MG tablet Take 200 mg by mouth at bedtime.     levonorgestrel (MIRENA) 20 MCG/24HR IUD by Intrauterine route.     No facility-administered medications prior to visit.    PAST MEDICAL HISTORY: Past Medical History:  Diagnosis Date   ADHD    ADHD    Anxiety    Bipolar disorder (HCC)    Cervical spinal stenosis 12/27/2020   C6-7 level   Chronic back pain    Common migraine with intractable migraine 12/27/2020   Depression    Family history of breast cancer    Family history of melanoma    Hypertension    Kidney failure    Migraine    Neuropathy    PTSD (post-traumatic stress disorder)     PAST SURGICAL HISTORY: Past Surgical History:  Procedure Laterality Date   ANTERIOR CERVICAL DECOMP/DISCECTOMY FUSION N/A 01/28/2021   Procedure: Cervical Six-Seven Anterior Cervical Decompression/Discectomy/Fusion;  Surgeon: Coletta Memos, MD;  [facility];  Service: Neurosurgery;  Laterality: N/A;   DILATION AND CURETTAGE OF UTERUS     3 blood tranfusions    FAMILY HISTORY: Family History  Problem Relation Age of Onset   Alcohol abuse Mother    Hypertension Mother  Hyperlipidemia Mother    Skin cancer Mother    Hypertension Father    Hyperlipidemia Father    Alcohol abuse Father    Alcohol abuse Brother    Hypertension Brother    Breast cancer Maternal Aunt        Neg GT in 2017   Melanoma Maternal Aunt    Hypertension Maternal Grandmother    Breast cancer Maternal Grandmother    Throat cancer Paternal Grandmother    Hypertension Paternal Grandfather    Breast cancer Other        Mother's 3 maternal cousins   Breast cancer  Other    Melanoma Other        mother's maternal female first cousin    SOCIAL HISTORY: Social History   Socioeconomic History   Marital status: Single    Spouse name: Not on file   Number of children: 1   Years of education: Not on file   Highest education level: Bachelor's degree (e.g., BA, AB, BS)  Occupational History   Not on file  Tobacco Use   Smoking status: Former    Types: E-cigarettes    Quit date: 01/20/2021    Years since quitting: 0.4   Smokeless tobacco: Never  Vaping Use   Vaping Use: Former  Substance and Sexual Activity   Alcohol use: Not Currently   Drug use: Yes    Types: Marijuana    Comment: to help with pain   Sexual activity: Not on file  Other Topics Concern   Not on file  Social History Narrative   Lives with partner, son   Social Determinants of Health   Financial Resource Strain: Not on file  Food Insecurity: Not on file  Transportation Needs: Not on file  Physical Activity: Not on file  Stress: Not on file  Social Connections: Not on file  Intimate Partner Violence: Not on file   PHYSICAL EXAM  Vitals:   07/05/21 0829  BP: 123/84  Pulse: 65  Weight: 150 lb (68 kg)  Height: 5\' 4"  (1.626 m)   Body mass index is 25.75 kg/m.  Generalized: Well developed, in no acute distress   Neurological examination  Mentation: Alert oriented to time, place, history taking. Follows all commands speech and language fluent Cranial nerve II-XII: Pupils were equal round reactive to light. Extraocular movements were full, visual field were full on confrontational test. Facial sensation and strength were normal. Head turning and shoulder shrug  were normal and symmetric. Motor: Good strength to all extremities, reported pain with left hip flexion to low back Sensory: Sensory testing is intact to soft touch on all 4 extremities. No evidence of extinction is noted.  Coordination: Cerebellar testing reveals good finger-nose-finger and heel-to-shin  bilaterally.  Gait and station: Gait is normal. Reflexes: Deep tendon reflexes are symmetric and normal bilaterally.   DIAGNOSTIC DATA (LABS, IMAGING, TESTING) - I reviewed patient records, labs, notes, testing and imaging myself where available.  Lab Results  Component Value Date   WBC 7.5 01/27/2021   HGB 13.4 01/27/2021   HCT 40.1 01/27/2021   MCV 93.0 01/27/2021   PLT 217 01/27/2021      Component Value Date/Time   NA 137 01/27/2021 1305   K 4.2 01/27/2021 1305   CL 105 01/27/2021 1305   CO2 25 01/27/2021 1305   GLUCOSE 93 01/27/2021 1305   BUN 9 01/27/2021 1305   CREATININE 0.81 01/27/2021 1305   CALCIUM 8.7 (L) 01/27/2021 1305   GFRNONAA >60 01/27/2021  1305   No results found for: CHOL, HDL, LDLCALC, LDLDIRECT, TRIG, CHOLHDL No results found for: JXBJ4N No results found for: VITAMINB12 No results found for: TSH  ASSESSMENT AND PLAN 41 y.o. year old adult  has a past medical history of ADHD, ADHD, Anxiety, Bipolar disorder (HCC), Cervical spinal stenosis (12/27/2020), Chronic back pain, Common migraine with intractable migraine (12/27/2020), Depression, Family history of breast cancer, Family history of melanoma, Hypertension, Kidney failure, Migraine, Neuropathy, and PTSD (post-traumatic stress disorder). here with:  Low back pain  Post-op cervical decompression  Common migraine headache  -Nyrah is frustrated with her current situation, and it seems her biggest issue is needing treatment for ADHD, I offered referral to [facility] but they do not take her insurance, we talked about the importance of establishing with a new psychiatrist to manage her concerns, we also discussed resources for mental health crisis, she has an appointment to see a new psychiatrist coming up  -recommend discuss pain management with neurosurgery, scheduled for injections in February for low back pain   -Try Maxalt 10 mg PRN for acute migraine headache, in past tried  Imitrex, Dr. Anne Hahn tried diclofenac without benefit, if helpful PCP can continue to offer   -recommend continue to see primary care, work with psychiatry for management of mood disorder   -continue PT, waiting for neurosurgery to send referral over to start low back PT  -Return here PRN basis  Margie Ege, AGNP-C, DNP 07/05/2021, 8:40 AM [facility] [address], [address] [place], [place] [address] [phone]

## 2021-07-05 ENCOUNTER — Other Ambulatory Visit: Payer: Self-pay

## 2021-07-05 ENCOUNTER — Ambulatory Visit: Payer: Medicaid Other | Admitting: Neurology

## 2021-07-05 ENCOUNTER — Encounter: Payer: Self-pay | Admitting: Neurology

## 2021-07-05 VITALS — BP 123/84 | HR 65 | Ht 64.0 in | Wt 150.0 lb

## 2021-07-05 DIAGNOSIS — G8929 Other chronic pain: Secondary | ICD-10-CM

## 2021-07-05 DIAGNOSIS — M4722 Other spondylosis with radiculopathy, cervical region: Secondary | ICD-10-CM | POA: Diagnosis not present

## 2021-07-05 DIAGNOSIS — G43019 Migraine without aura, intractable, without status migrainosus: Secondary | ICD-10-CM | POA: Diagnosis not present

## 2021-07-05 DIAGNOSIS — M549 Dorsalgia, unspecified: Secondary | ICD-10-CM | POA: Insufficient documentation

## 2021-07-05 DIAGNOSIS — M545 Low back pain, unspecified: Secondary | ICD-10-CM | POA: Diagnosis not present

## 2021-07-05 MED ORDER — RIZATRIPTAN BENZOATE 10 MG PO TBDP: 10.0000 mg | ORAL_TABLET | ORAL | 3 refills | Status: AC | PRN

## 2021-07-05 NOTE — Patient Instructions (Signed)
Can try Maxalt for headache Recommend getting in with psychiatry and new primary care  Follow back up with neurosurgery  Return back here as needed

## 2021-07-14 ENCOUNTER — Inpatient Hospital Stay: Payer: Medicaid Other | Admitting: Dietician

## 2021-07-25 ENCOUNTER — Inpatient Hospital Stay: Payer: Medicaid Other | Attending: Genetic Counselor | Admitting: Dietician

## 2021-07-25 NOTE — Progress Notes (Signed)
Patient did not show for scheduled nutrition appointment.  

## 2021-08-03 ENCOUNTER — Ambulatory Visit (INDEPENDENT_AMBULATORY_CARE_PROVIDER_SITE_OTHER): Payer: Medicaid Other | Admitting: Licensed Clinical Social Worker

## 2021-08-03 DIAGNOSIS — F3181 Bipolar II disorder: Secondary | ICD-10-CM

## 2021-08-03 DIAGNOSIS — F603 Borderline personality disorder: Secondary | ICD-10-CM

## 2021-08-03 DIAGNOSIS — F908 Attention-deficit hyperactivity disorder, other type: Secondary | ICD-10-CM

## 2021-08-03 DIAGNOSIS — F988 Other specified behavioral and emotional disorders with onset usually occurring in childhood and adolescence: Secondary | ICD-10-CM | POA: Insufficient documentation
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
## 2021-08-07 ENCOUNTER — Encounter: Payer: Medicaid Other | Admitting: Nutrition

## 2021-08-14 ENCOUNTER — Other Ambulatory Visit: Payer: Self-pay

## 2021-08-14 ENCOUNTER — Ambulatory Visit (HOSPITAL_COMMUNITY)
Admission: RE | Admit: 2021-08-14 | Discharge: 2021-08-14 | Disposition: A | Discharge status: Home / Self Care | Payer: Medicaid Other | Source: Ambulatory Visit | Attending: Hematology and Oncology | Admitting: Hematology and Oncology

## 2021-08-14 DIAGNOSIS — R928 Other abnormal and inconclusive findings on diagnostic imaging of breast: Secondary | ICD-10-CM | POA: Diagnosis present

## 2021-08-14 DIAGNOSIS — Z9189 Other specified personal risk factors, not elsewhere classified: Secondary | ICD-10-CM | POA: Insufficient documentation

## 2021-08-14 IMAGING — MR MR BREAST BILAT WO/W CM
9 of 12 series · 29 of 48 positions shown · IV contrast (gadavist)
Comparison: Previous mammograms, ultrasounds and [DATE] breast
MR

CLINICAL DATA: 40-year-old female with [DATE] breast MR
demonstrating an indeterminate 1 cm LOWER LEFT breast mass with
subsequent ultrasound identified a mass in this area with biopsy
demonstrating concordant benign usual ductal hyperplasia and
fibrocystic changes. MRI today is to ensure that the sonographic
mass biopsied corresponds to the prior MRI finding/mass. Patient
also has a high lifetime risk for developing breast cancer (greater
than 20%).

EXAM:
BILATERAL BREAST MRI WITH AND WITHOUT CONTRAST
TECHNIQUE: Multiplanar, multisequence MR images of both breasts were obtained
prior to and following the intravenous administration of 7 ml of
Gadavist

[Series 2: T2 · axial · 3.0mm · 0.91mm/px · 1 of 58 slices shown]
[im 1/58]
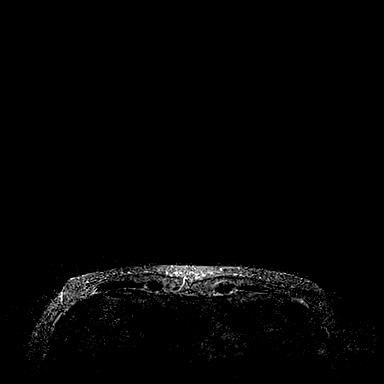

[Series 3: T1 fat-sat · axial · 1.2mm · 0.78mm/px · z∈[-57,+115]mm · 5 of 144 slices shown (1 of 5)]
[im 1/144]
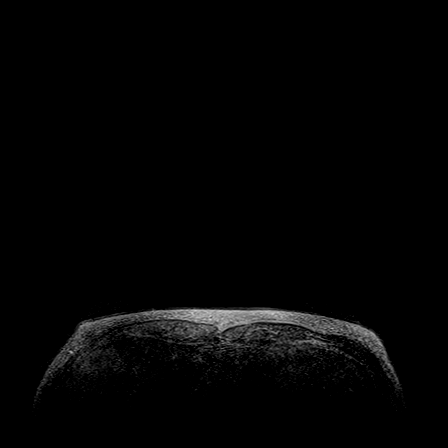
[im 36/144]
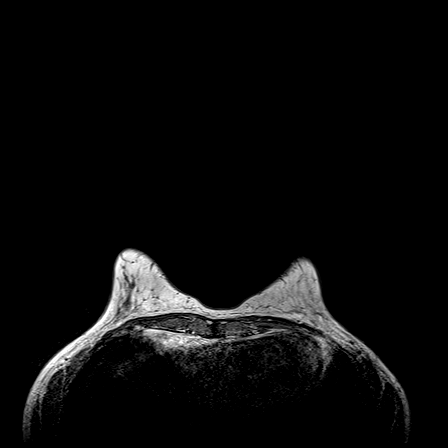
[im 72/144]
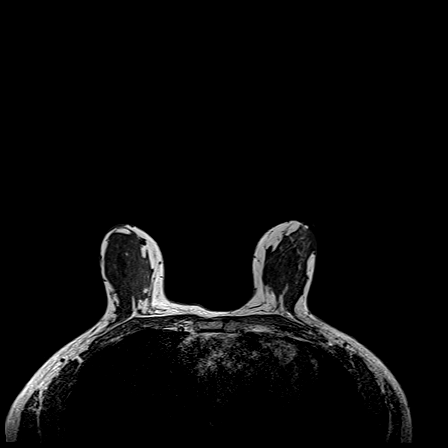
[im 108/144]
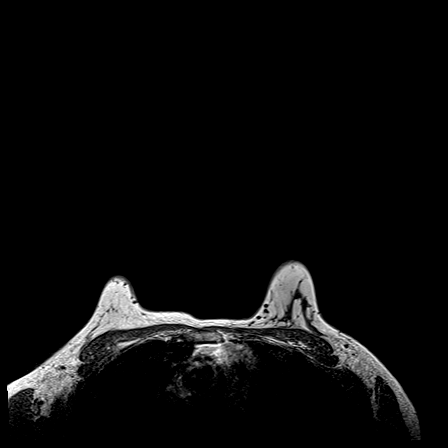
[im 144/144]
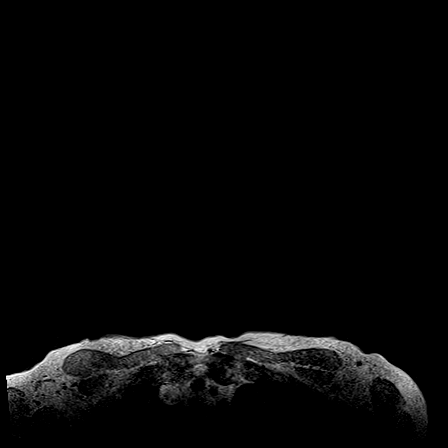

[Series 4: T1 fat-sat · axial · 1.2mm · 0.84mm/px · z∈[-52,+100]mm · 5 of 128 slices shown (2 of 5)]
[im 1/128]
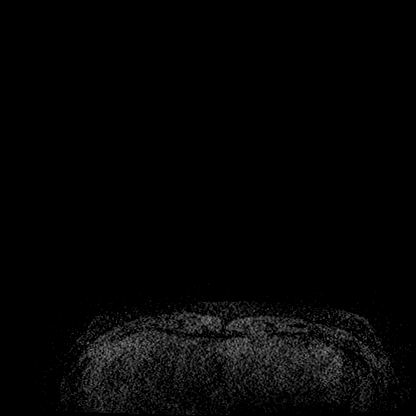
[im 32/128]
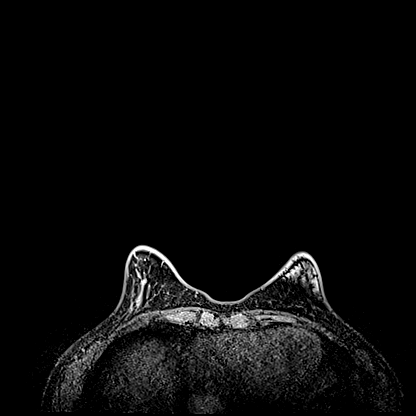
[im 64/128]
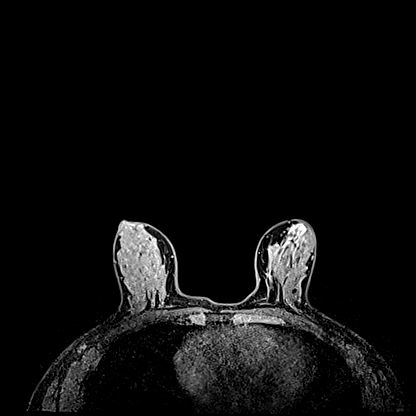
[im 96/128]
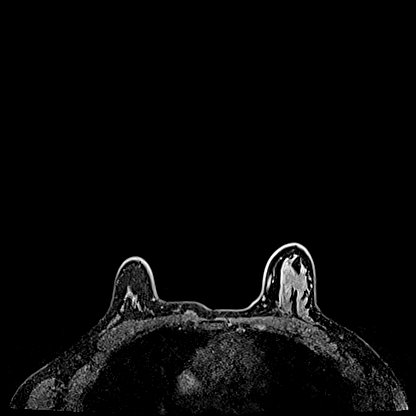
[im 128/128]
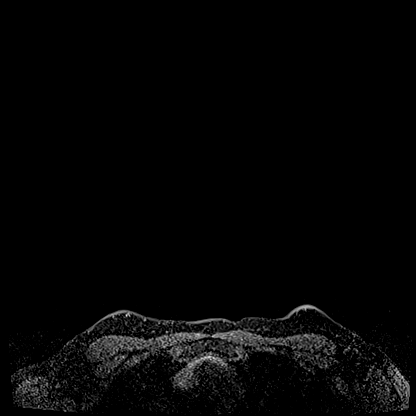

[Series 5: T1 fat-sat · axial · 1.2mm · 0.84mm/px · z∈[-52,+100]mm · 5 of 128 slices shown (3 of 5)]
[im 1/128]
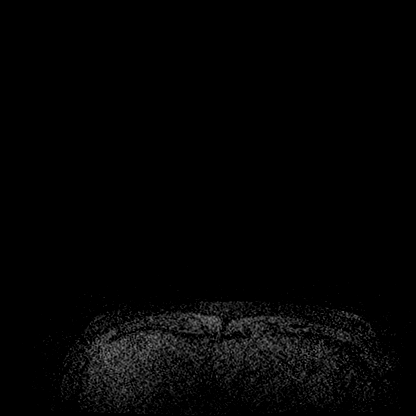
[im 32/128]
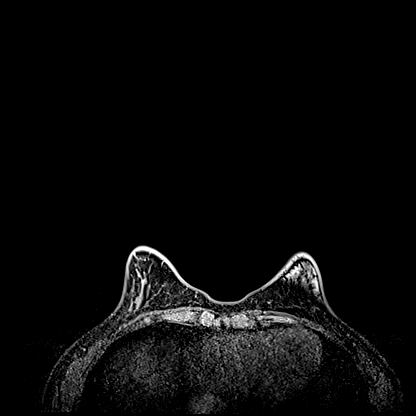
[im 64/128]
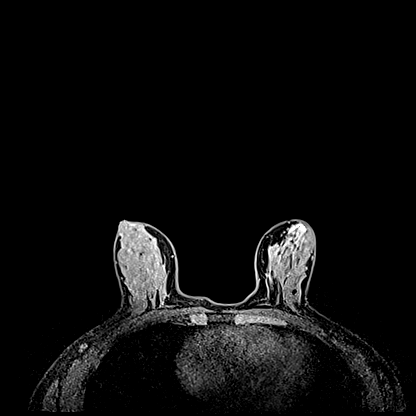
[im 96/128]
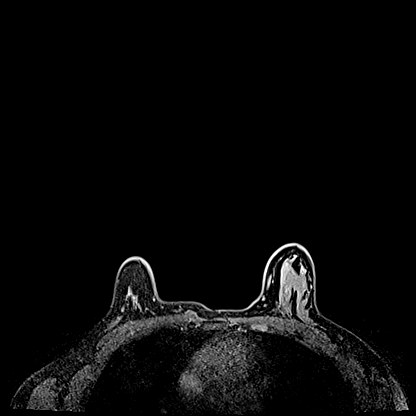
[im 128/128]
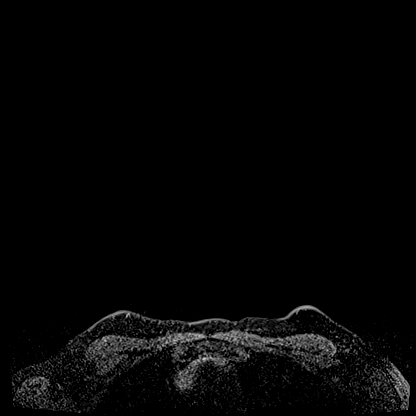

[Series 6: T1 fat-sat · axial · 1.2mm · 0.84mm/px · z∈[-52,+100]mm · 5 of 128 slices shown (4 of 5)]
[im 1/128]
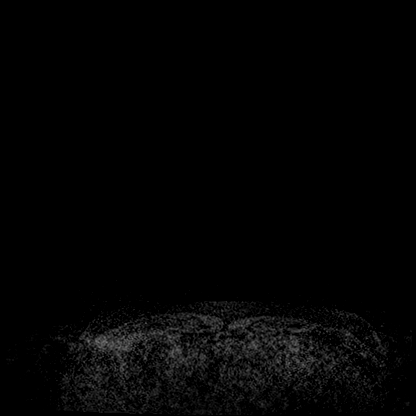
[im 32/128]
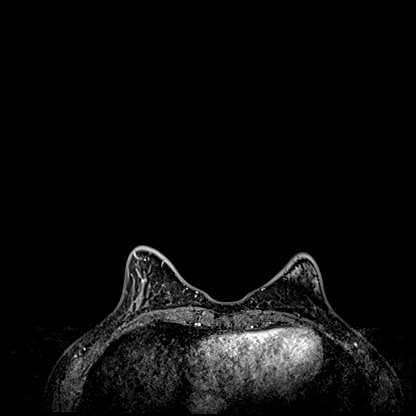
[im 64/128]
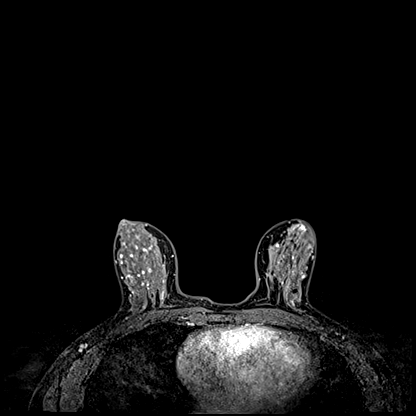
[im 96/128]
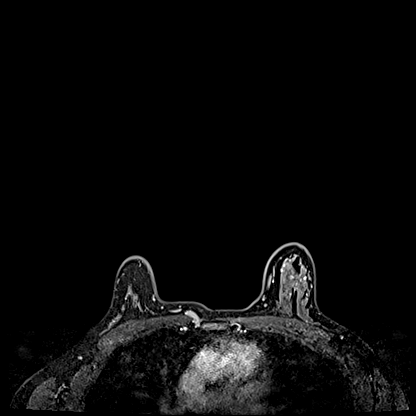
[im 128/128]
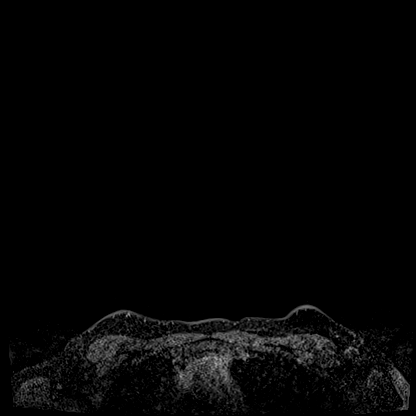

[Series 7: T1 · axial · 1.2mm · 0.84mm/px · z∈[-52,+100]mm · 5 of 128 slices shown (1 of 3)]
[im 1/128]
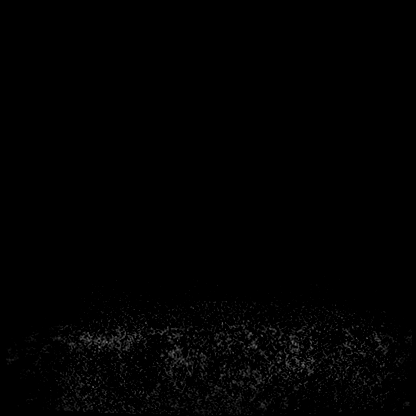
[im 32/128]
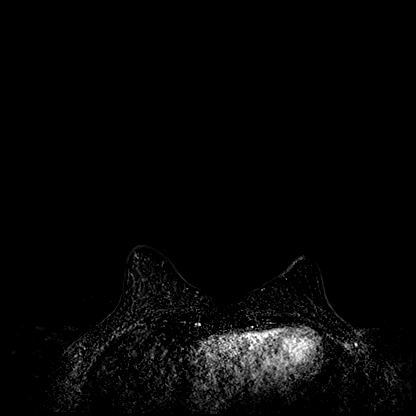
[im 64/128]
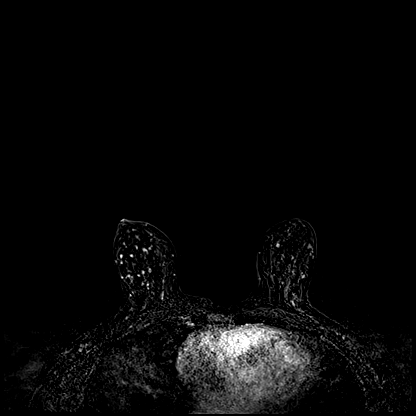
[im 96/128]
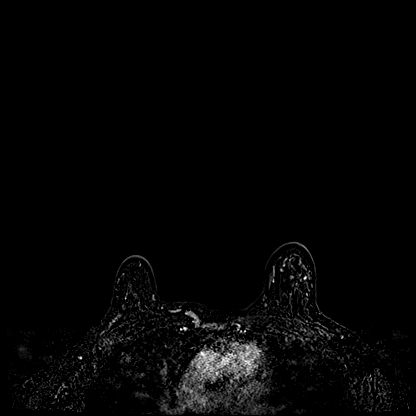
[im 128/128]
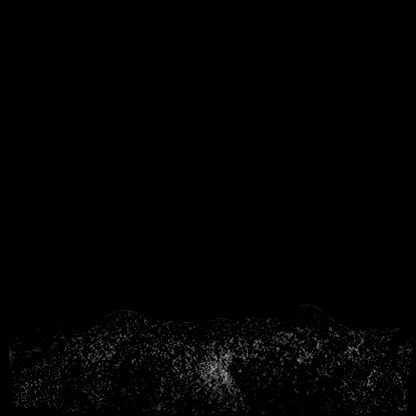

[Series 8: T1 · coronal · 350.0mm · 0.84mm/px · 1 of 3 slices shown (2 of 3)]
[im 1/3]
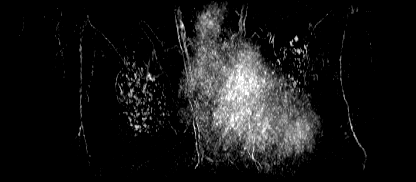

[Series 9: T1 · axial · 153.6mm · 0.84mm/px · 1 of 3 slices shown (3 of 3)]
[im 1/3]
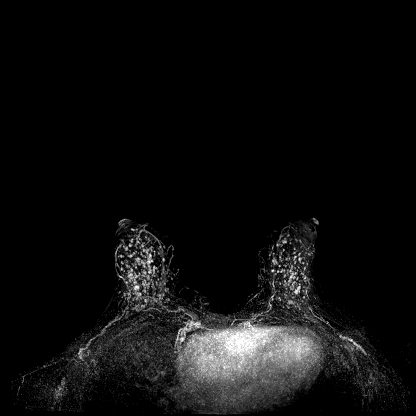

[Series 10: T1 fat-sat · axial · 1.2mm · 0.84mm/px · 1 of 128 slices shown (5 of 5)]
[im 1/128]
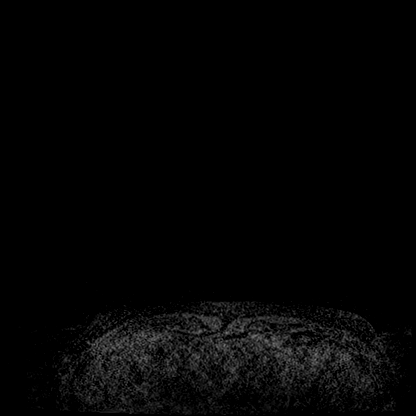

[29 of 48 positions shown; findings below may reference images not displayed]

Three-dimensional MR images were rendered by post-processing of the
original MR data on an independent workstation. The
three-dimensional MR images were interpreted, and findings are
reported in the following complete MRI report for this study. Three
dimensional images were evaluated at the independent interpreting
workstation using the DynaCAD thin client.
FINDINGS: Breast composition: d. Extreme fibroglandular tissue.

Background parenchymal enhancement: Marked

Right breast: Innumerable cysts are again noted. No suspicious mass
or worrisome enhancement identified.

Left breast: Biopsy clip artifact within the anterior LOWER LEFT
breast corresponds to the site of 1 cm mass identified on [DATE]
breast MR, signifying that the biopsy-proven benign LEFT breast mass
represents the same mass identified on MR.

Biopsy clip artifact within the posterior LEFT breast from previous
benign LEFT breast biopsy (fibroadenoma) is again noted.

Innumerable cysts are again noted. No suspicious mass or worrisome
enhancement identified.

Lymph nodes: No abnormal appearing lymph nodes.

Ancillary findings:  None.
IMPRESSION: 1. Biopsy-proven benign LOWER LEFT breast mass identified and
sampled sonographically earlier this year corresponds to the mass
identified by [DATE] MR. No further imaging follow-up
recommended.
2. No suspicious MR findings within either breast.
3. High lifetime risk for developing breast cancer.

RECOMMENDATION:
Bilateral screening mammogram in 3 months to resume annual mammogram
schedule.

Bilateral screening breast MRI in 1 year in this high risk patient.

BI-RADS CATEGORY  2: Benign.

## 2021-08-14 MED ORDER — GADOBUTROL 1 MMOL/ML IV SOLN
7.0000 mL | Freq: Once | INTRAVENOUS | Status: AC | PRN
  Administered 2021-08-14: 7 mL via INTRAVENOUS

## 2021-08-15 ENCOUNTER — Ambulatory Visit (INDEPENDENT_AMBULATORY_CARE_PROVIDER_SITE_OTHER): Payer: Medicaid Other | Admitting: Psychiatry

## 2021-08-15 DIAGNOSIS — F603 Borderline personality disorder: Secondary | ICD-10-CM

## 2021-08-15 DIAGNOSIS — F3181 Bipolar II disorder: Secondary | ICD-10-CM | POA: Diagnosis not present

## 2021-08-15 DIAGNOSIS — F908 Attention-deficit hyperactivity disorder, other type: Secondary | ICD-10-CM
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
## 2021-08-17 ENCOUNTER — Other Ambulatory Visit (HOSPITAL_COMMUNITY): Payer: Self-pay | Admitting: Psychiatry

## 2021-08-17 ENCOUNTER — Other Ambulatory Visit (HOSPITAL_COMMUNITY): Payer: Self-pay | Admitting: *Deleted

## 2021-08-17 DIAGNOSIS — F3181 Bipolar II disorder: Secondary | ICD-10-CM

## 2021-08-22 ENCOUNTER — Telehealth: Payer: Self-pay

## 2021-08-22 ENCOUNTER — Encounter (HOSPITAL_COMMUNITY): Payer: Self-pay

## 2021-08-22 ENCOUNTER — Telehealth (HOSPITAL_COMMUNITY): Payer: Medicaid Other | Admitting: Psychiatry

## 2021-08-22 ENCOUNTER — Other Ambulatory Visit: Payer: Self-pay | Admitting: Hematology and Oncology

## 2021-08-22 DIAGNOSIS — Z9189 Other specified personal risk factors, not elsewhere classified: Secondary | ICD-10-CM

## 2021-08-22 NOTE — Telephone Encounter (Signed)
Called pt to give results per MD. Pt verbalized understanding and will f/u w/MM in 3 mo. ?

## 2021-08-22 NOTE — Telephone Encounter (Signed)
-----   Message from Rachel Moulds, MD sent at 08/22/2021  2:58 PM EDT ----- ?Vernona Rieger, ? ?Can you let her know her MRI results, no concerning findings. ?Resume mammogram in 3 months. ?

## 2021-08-31 ENCOUNTER — Inpatient Hospital Stay: Payer: Medicaid Other | Attending: Genetic Counselor | Admitting: Dietician

## 2021-08-31 ENCOUNTER — Other Ambulatory Visit: Payer: Self-pay

## 2021-08-31 NOTE — Progress Notes (Signed)
Nutrition ? ?Patient attended Nutrition 101 this afternoon.  ? ?Shiva Karis B. Mohd Clemons, RD, LDN ?Registered Dietitian ?[phone] ? ?

## 2021-09-14 ENCOUNTER — Ambulatory Visit: Payer: Medicaid Other | Admitting: Hematology and Oncology

## 2021-10-24 ENCOUNTER — Telehealth: Payer: Self-pay | Admitting: Hematology and Oncology

## 2021-10-24 NOTE — Telephone Encounter (Signed)
Rescheduled appointment per providers. Patient aware.  ? ?

## 2021-10-26 ENCOUNTER — Telehealth (HOSPITAL_COMMUNITY): Payer: Self-pay | Admitting: Licensed Clinical Social Worker

## 2021-11-07 ENCOUNTER — Ambulatory Visit: Payer: Medicaid Other

## 2021-11-13 ENCOUNTER — Ambulatory Visit: Payer: Medicaid Other

## 2021-11-22 ENCOUNTER — Ambulatory Visit
Admission: RE | Admit: 2021-11-22 | Discharge: 2021-11-22 | Disposition: A | Discharge status: Home / Self Care | Payer: Medicaid Other | Source: Ambulatory Visit | Attending: Hematology and Oncology | Admitting: Hematology and Oncology

## 2021-11-22 DIAGNOSIS — Z9189 Other specified personal risk factors, not elsewhere classified: Secondary | ICD-10-CM

## 2021-11-22 IMAGING — MG MM DIGITAL SCREENING BILAT W/ TOMO AND CAD
8 series · 9 of 24 positions shown · non-contrast
Comparison: Previous exam(s).

CLINICAL DATA: Screening.

EXAM:
DIGITAL SCREENING BILATERAL MAMMOGRAM WITH TOMOSYNTHESIS AND CAD
TECHNIQUE: Bilateral screening digital craniocaudal and mediolateral oblique
mammograms were obtained. Bilateral screening digital breast
tomosynthesis was performed. The images were evaluated with
computer-aided detection.

[L CC synth-2D]
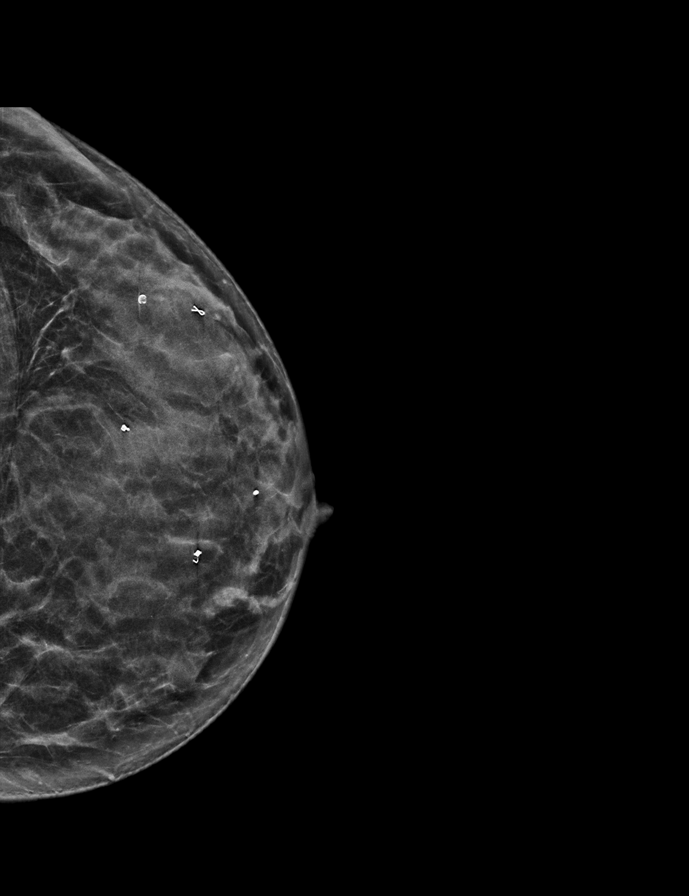

[R CC synth-2D]
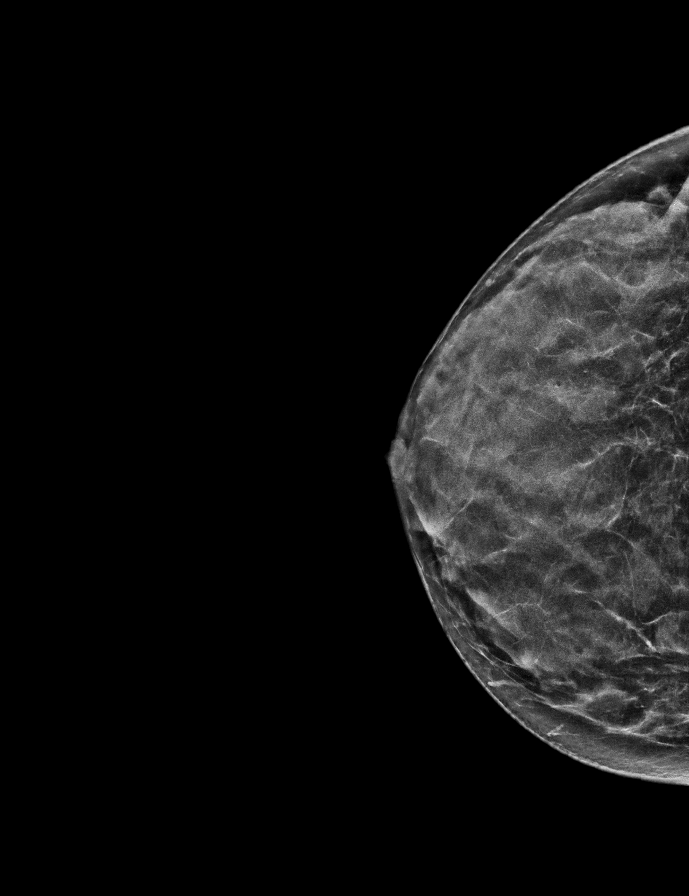

[R MLO synth-2D]
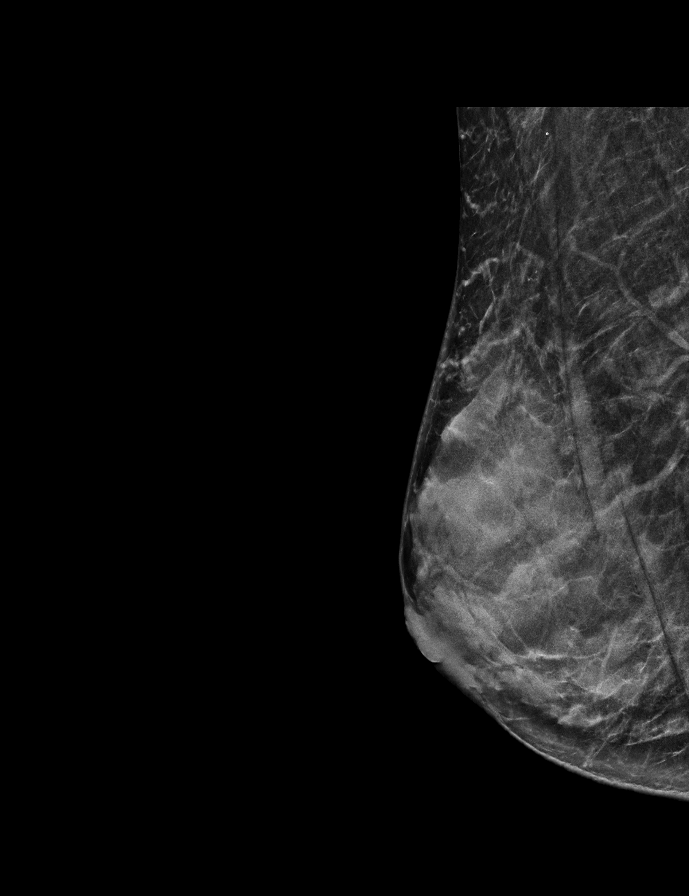

[L MLO synth-2D]
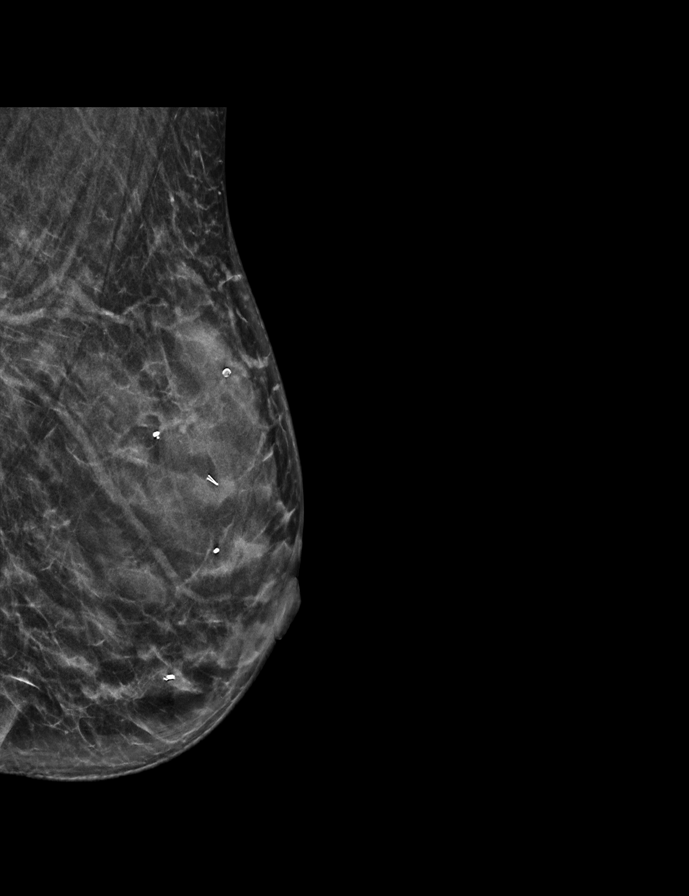

[R CC tomo · 2 of 49 frames shown]
[frame 16/49]
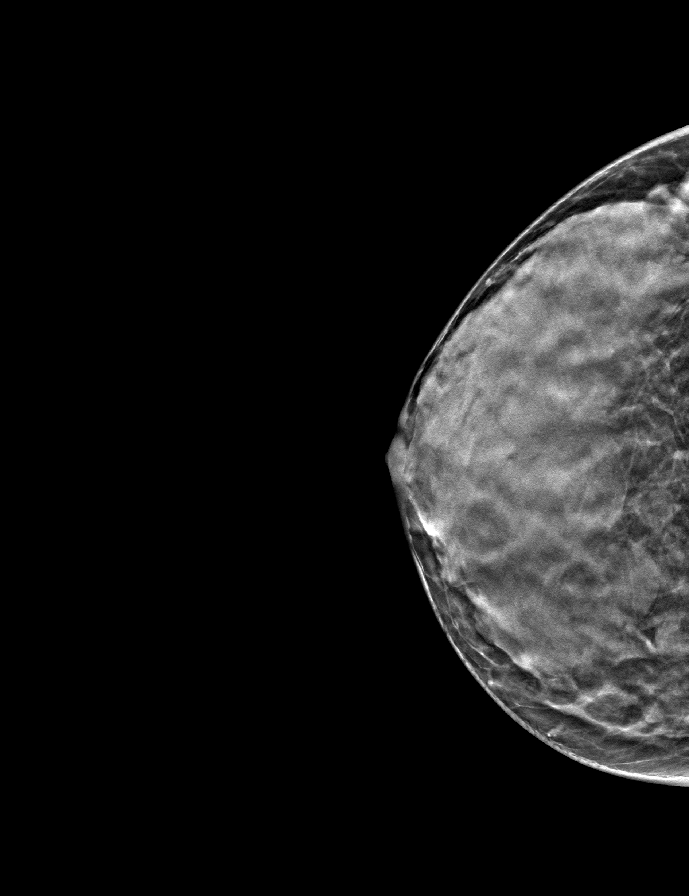
[frame 25/49]
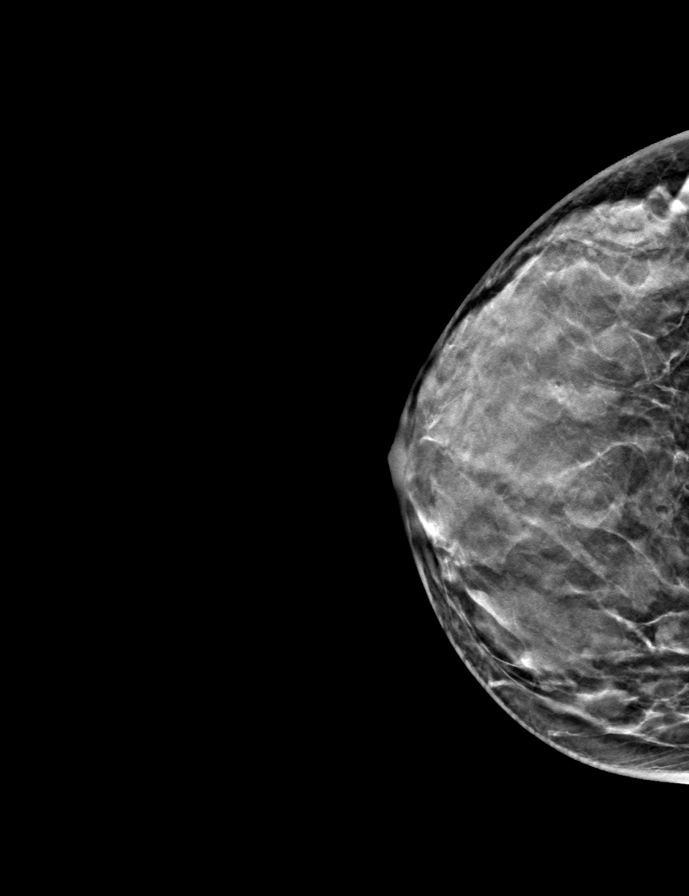

[L MLO tomo · tomo slice 29/56.0]
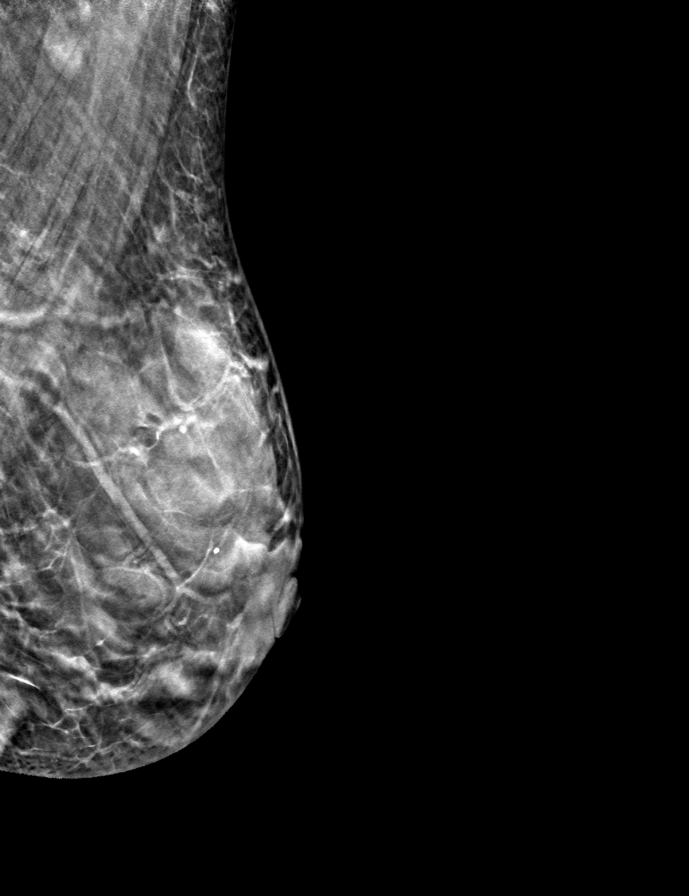

[R MLO tomo · tomo slice 29/57.0]
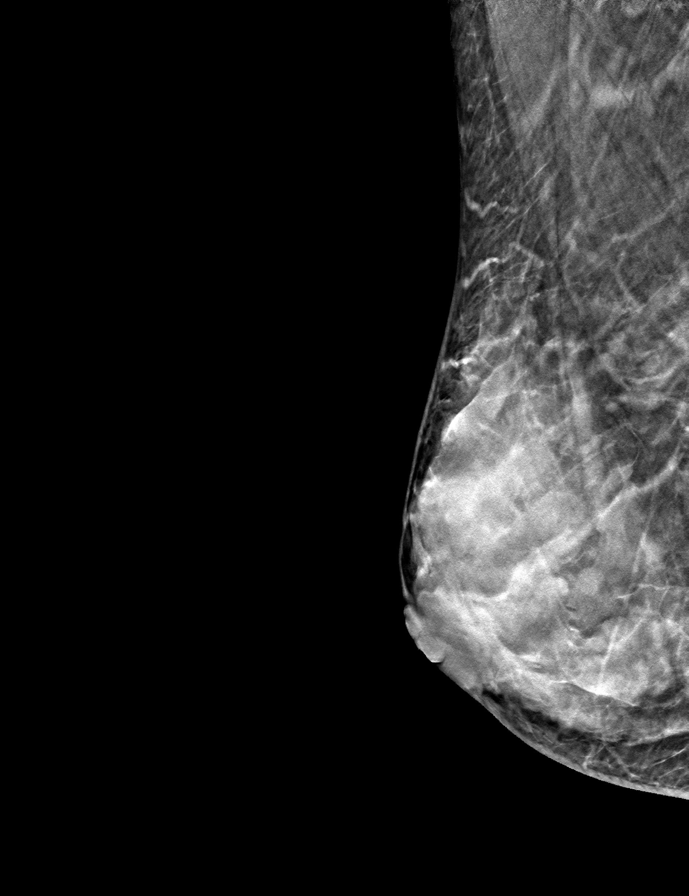

[L CC tomo · tomo slice 26/51.0]
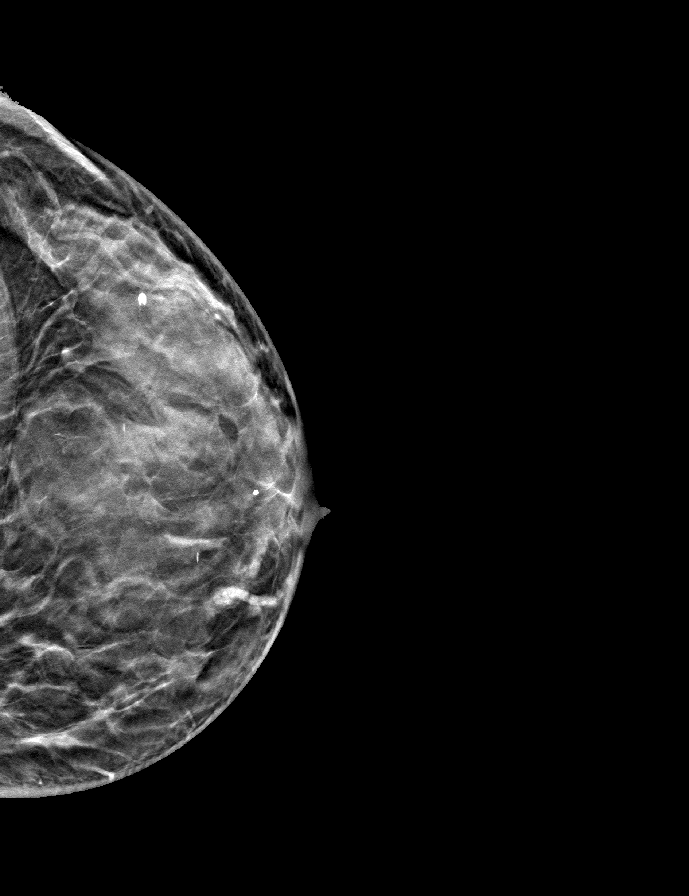

[9 of 24 positions shown; findings below may reference images not displayed]

ACR Breast Density Category d: The breast tissue is extremely dense,
which lowers the sensitivity of mammography
FINDINGS: There are no findings suspicious for malignancy.
IMPRESSION: No mammographic evidence of malignancy. A result letter of this
screening mammogram will be mailed directly to the patient.

RECOMMENDATION:
Screening mammogram in one year. (Code:[7P])

BI-RADS CATEGORY  1: Negative.

## 2021-12-18 ENCOUNTER — Inpatient Hospital Stay: Payer: Medicaid Other | Attending: Hematology and Oncology | Admitting: Hematology and Oncology

## 2021-12-25 ENCOUNTER — Ambulatory Visit: Payer: Medicaid Other | Admitting: Hematology and Oncology

## 2021-12-29 ENCOUNTER — Ambulatory Visit: Payer: Medicaid Other | Admitting: Hematology and Oncology

## 2022-07-03 ENCOUNTER — Other Ambulatory Visit: Payer: Self-pay

## 2022-07-03 ENCOUNTER — Telehealth: Payer: Self-pay

## 2022-07-03 DIAGNOSIS — Z803 Family history of malignant neoplasm of breast: Secondary | ICD-10-CM

## 2022-07-03 NOTE — Telephone Encounter (Signed)
LVM informing patient that referral had been placed to [facility]. Patient left message that she had recently moved to the [facility] area and would like to transfer care locally and was requesting referral.  Dr. Chryl Heck aware and referral successfully faxed to [facility].  Patient provided with callback number should she have any additional questions or concerns.
